# Patient Record
Sex: Female | Born: 1935 | Race: White | Hispanic: No | Marital: Married | Smoking: Never smoker
Health system: Southern US, Community
[De-identification: ages and names within clinical notes are randomized; demographics above are authoritative.]

## PROBLEM LIST (undated history)

## (undated) DIAGNOSIS — E785 Hyperlipidemia, unspecified: Secondary | ICD-10-CM

## (undated) DIAGNOSIS — I4892 Unspecified atrial flutter: Secondary | ICD-10-CM

## (undated) DIAGNOSIS — M3500 Sicca syndrome, unspecified: Secondary | ICD-10-CM

## (undated) DIAGNOSIS — M069 Rheumatoid arthritis, unspecified: Secondary | ICD-10-CM

## (undated) DIAGNOSIS — C801 Malignant (primary) neoplasm, unspecified: Secondary | ICD-10-CM

## (undated) DIAGNOSIS — I2699 Other pulmonary embolism without acute cor pulmonale: Secondary | ICD-10-CM

## (undated) DIAGNOSIS — I10 Essential (primary) hypertension: Secondary | ICD-10-CM

## (undated) DIAGNOSIS — M359 Systemic involvement of connective tissue, unspecified: Secondary | ICD-10-CM

## (undated) DIAGNOSIS — E039 Hypothyroidism, unspecified: Secondary | ICD-10-CM

## (undated) DIAGNOSIS — I82409 Acute embolism and thrombosis of unspecified deep veins of unspecified lower extremity: Secondary | ICD-10-CM

## (undated) HISTORY — PX: OTHER SURGICAL HISTORY: SHX169

## (undated) HISTORY — PX: ABDOMINAL HYSTERECTOMY: SHX81

## (undated) HISTORY — PX: APPENDECTOMY: SHX54

## (undated) HISTORY — PX: JOINT REPLACEMENT: SHX530

## (undated) HISTORY — PX: PATENT DUCTUS ARTERIOUS REPAIR: SHX269

## (undated) HISTORY — PX: ASD REPAIR: SHX258

---

## 2004-10-15 ENCOUNTER — Ambulatory Visit: Payer: Self-pay | Admitting: Internal Medicine

## 2004-11-13 ENCOUNTER — Ambulatory Visit: Payer: Self-pay | Admitting: Gastroenterology

## 2005-11-22 ENCOUNTER — Ambulatory Visit: Payer: Self-pay | Admitting: Internal Medicine

## 2006-12-11 ENCOUNTER — Ambulatory Visit: Payer: Self-pay | Admitting: Internal Medicine

## 2007-03-18 ENCOUNTER — Ambulatory Visit: Payer: Self-pay | Admitting: Internal Medicine

## 2008-01-07 ENCOUNTER — Ambulatory Visit: Payer: Self-pay | Admitting: Internal Medicine

## 2008-07-26 ENCOUNTER — Encounter: Payer: Self-pay | Admitting: Orthopedic Surgery

## 2008-08-25 ENCOUNTER — Encounter: Payer: Self-pay | Admitting: Orthopedic Surgery

## 2008-09-15 ENCOUNTER — Ambulatory Visit: Payer: Self-pay | Admitting: Ophthalmology

## 2008-10-04 ENCOUNTER — Ambulatory Visit: Payer: Self-pay | Admitting: Ophthalmology

## 2008-11-29 ENCOUNTER — Ambulatory Visit: Payer: Self-pay | Admitting: Ophthalmology

## 2008-12-06 ENCOUNTER — Ambulatory Visit: Payer: Self-pay | Admitting: Ophthalmology

## 2009-01-09 ENCOUNTER — Ambulatory Visit: Payer: Self-pay | Admitting: Internal Medicine

## 2009-08-03 ENCOUNTER — Ambulatory Visit: Payer: Self-pay | Admitting: Orthopedic Surgery

## 2010-01-10 ENCOUNTER — Ambulatory Visit: Payer: Self-pay | Admitting: Internal Medicine

## 2010-12-20 ENCOUNTER — Ambulatory Visit: Payer: Self-pay | Admitting: Internal Medicine

## 2010-12-24 ENCOUNTER — Ambulatory Visit: Payer: Self-pay | Admitting: Internal Medicine

## 2011-01-11 ENCOUNTER — Ambulatory Visit: Payer: Self-pay | Admitting: Internal Medicine

## 2011-02-26 ENCOUNTER — Ambulatory Visit: Payer: Self-pay | Admitting: Unknown Physician Specialty

## 2011-08-15 ENCOUNTER — Other Ambulatory Visit: Payer: Self-pay | Admitting: Internal Medicine

## 2011-08-30 ENCOUNTER — Ambulatory Visit: Payer: Self-pay | Admitting: Internal Medicine

## 2011-09-04 ENCOUNTER — Ambulatory Visit: Payer: Self-pay | Admitting: Internal Medicine

## 2011-10-28 DIAGNOSIS — G5601 Carpal tunnel syndrome, right upper limb: Secondary | ICD-10-CM | POA: Insufficient documentation

## 2011-10-28 DIAGNOSIS — M47812 Spondylosis without myelopathy or radiculopathy, cervical region: Secondary | ICD-10-CM | POA: Insufficient documentation

## 2011-10-29 DIAGNOSIS — Z79899 Other long term (current) drug therapy: Secondary | ICD-10-CM | POA: Insufficient documentation

## 2012-03-06 DIAGNOSIS — K279 Peptic ulcer, site unspecified, unspecified as acute or chronic, without hemorrhage or perforation: Secondary | ICD-10-CM | POA: Insufficient documentation

## 2012-03-06 DIAGNOSIS — Z8774 Personal history of (corrected) congenital malformations of heart and circulatory system: Secondary | ICD-10-CM | POA: Insufficient documentation

## 2012-03-06 DIAGNOSIS — E034 Atrophy of thyroid (acquired): Secondary | ICD-10-CM | POA: Insufficient documentation

## 2013-06-20 ENCOUNTER — Emergency Department: Payer: Self-pay | Admitting: Emergency Medicine

## 2013-06-20 LAB — CBC
MCH: 30.3 pg (ref 26.0–34.0)
MCHC: 34.1 g/dL (ref 32.0–36.0)
Platelet: 154 10*3/uL (ref 150–440)
RBC: 3.72 10*6/uL — ABNORMAL LOW (ref 3.80–5.20)
WBC: 2.4 10*3/uL — ABNORMAL LOW (ref 3.6–11.0)

## 2013-06-20 LAB — URINALYSIS, COMPLETE
Glucose,UR: NEGATIVE mg/dL (ref 0–75)
Ketone: NEGATIVE
Nitrite: POSITIVE
Protein: NEGATIVE
RBC,UR: 1 /HPF (ref 0–5)
Specific Gravity: 1.016 (ref 1.003–1.030)
Squamous Epithelial: NONE SEEN
WBC UR: 1 /HPF (ref 0–5)

## 2013-06-20 LAB — COMPREHENSIVE METABOLIC PANEL
Albumin: 3.4 g/dL (ref 3.4–5.0)
Alkaline Phosphatase: 78 U/L (ref 50–136)
BUN: 25 mg/dL — ABNORMAL HIGH (ref 7–18)
Bilirubin,Total: 0.6 mg/dL (ref 0.2–1.0)
Calcium, Total: 8.6 mg/dL (ref 8.5–10.1)
Chloride: 105 mmol/L (ref 98–107)
EGFR (African American): 52 — ABNORMAL LOW
Osmolality: 285 (ref 275–301)
SGPT (ALT): 20 U/L (ref 12–78)
Sodium: 140 mmol/L (ref 136–145)
Total Protein: 6.5 g/dL (ref 6.4–8.2)

## 2013-08-03 ENCOUNTER — Ambulatory Visit: Payer: Self-pay | Admitting: Internal Medicine

## 2014-07-07 ENCOUNTER — Ambulatory Visit: Payer: Self-pay | Admitting: Unknown Physician Specialty

## 2014-07-20 DIAGNOSIS — M1990 Unspecified osteoarthritis, unspecified site: Secondary | ICD-10-CM | POA: Insufficient documentation

## 2014-07-27 ENCOUNTER — Ambulatory Visit: Payer: Self-pay | Admitting: Internal Medicine

## 2014-07-27 LAB — IRON AND TIBC
Iron Bind.Cap.(Total): 350 ug/dL (ref 250–450)
Iron Saturation: 16 %
Iron: 56 ug/dL (ref 50–170)
UNBOUND IRON-BIND. CAP.: 294 ug/dL

## 2014-07-27 LAB — CBC CANCER CENTER
Bands: 5 %
HCT: 38 % (ref 35.0–47.0)
HGB: 12.5 g/dL (ref 12.0–16.0)
Lymphocytes: 19 %
MCH: 30.4 pg (ref 26.0–34.0)
MCHC: 33 g/dL (ref 32.0–36.0)
MCV: 92 fL (ref 80–100)
Monocytes: 17 %
Platelet: 194 x10 3/mm (ref 150–440)
RBC: 4.13 10*6/uL (ref 3.80–5.20)
RDW: 15 % — ABNORMAL HIGH (ref 11.5–14.5)
SEGMENTED NEUTROPHILS: 56 %
Variant Lymphocyte: 3 %
WBC: 3.6 x10 3/mm (ref 3.6–11.0)

## 2014-07-27 LAB — FERRITIN: Ferritin (ARMC): 123 ng/mL (ref 8–388)

## 2014-07-27 LAB — RETICULOCYTES
Absolute Retic Count: 0.1081 10*6/uL (ref 0.019–0.186)
Reticulocyte: 2.62 % (ref 0.4–3.1)

## 2014-07-27 LAB — LACTATE DEHYDROGENASE: LDH: 200 U/L (ref 81–246)

## 2014-07-27 LAB — FOLATE: Folic Acid: >100 ng/mL — ABNORMAL HIGH (ref 3.1–100.0)

## 2014-07-29 LAB — URINE IEP, RANDOM

## 2014-08-01 LAB — PROT IMMUNOELECTROPHORES(ARMC)

## 2014-08-12 ENCOUNTER — Ambulatory Visit: Payer: Self-pay | Admitting: Internal Medicine

## 2014-08-25 ENCOUNTER — Ambulatory Visit: Payer: Self-pay | Admitting: Internal Medicine

## 2015-07-19 ENCOUNTER — Other Ambulatory Visit: Payer: Self-pay | Admitting: Internal Medicine

## 2015-07-19 DIAGNOSIS — I728 Aneurysm of other specified arteries: Secondary | ICD-10-CM

## 2015-07-19 DIAGNOSIS — N183 Chronic kidney disease, stage 3 unspecified: Secondary | ICD-10-CM | POA: Insufficient documentation

## 2015-07-24 ENCOUNTER — Ambulatory Visit
Admission: RE | Admit: 2015-07-24 | Discharge: 2015-07-24 | Disposition: A | Discharge status: Home / Self Care | Payer: Medicare Other | Source: Ambulatory Visit | Attending: Internal Medicine | Admitting: Internal Medicine

## 2015-07-24 ENCOUNTER — Other Ambulatory Visit: Payer: Self-pay | Admitting: Internal Medicine

## 2015-07-24 DIAGNOSIS — I728 Aneurysm of other specified arteries: Secondary | ICD-10-CM

## 2015-07-24 DIAGNOSIS — R918 Other nonspecific abnormal finding of lung field: Secondary | ICD-10-CM | POA: Insufficient documentation

## 2015-07-24 HISTORY — DX: Essential (primary) hypertension: I10

## 2015-07-24 HISTORY — DX: Systemic involvement of connective tissue, unspecified: M35.9

## 2015-07-24 MED ORDER — IOHEXOL 350 MG/ML SOLN
75.0000 mL | Freq: Once | INTRAVENOUS | Status: AC | PRN
  Administered 2015-07-24: 75 mL via INTRAVENOUS

## 2015-07-27 ENCOUNTER — Other Ambulatory Visit: Payer: Self-pay | Admitting: Internal Medicine

## 2015-07-27 DIAGNOSIS — R911 Solitary pulmonary nodule: Secondary | ICD-10-CM | POA: Insufficient documentation

## 2015-10-16 ENCOUNTER — Ambulatory Visit
Admission: RE | Admit: 2015-10-16 | Discharge: 2015-10-16 | Disposition: A | Discharge status: Home / Self Care | Payer: Medicare Other | Source: Ambulatory Visit | Attending: Internal Medicine | Admitting: Internal Medicine

## 2015-10-16 DIAGNOSIS — R911 Solitary pulmonary nodule: Secondary | ICD-10-CM | POA: Diagnosis present

## 2015-10-16 DIAGNOSIS — I2510 Atherosclerotic heart disease of native coronary artery without angina pectoris: Secondary | ICD-10-CM | POA: Insufficient documentation

## 2015-10-16 DIAGNOSIS — M5134 Other intervertebral disc degeneration, thoracic region: Secondary | ICD-10-CM | POA: Insufficient documentation

## 2015-10-16 DIAGNOSIS — I700 Atherosclerosis of aorta: Secondary | ICD-10-CM | POA: Insufficient documentation

## 2015-10-23 ENCOUNTER — Other Ambulatory Visit: Payer: Self-pay | Admitting: Internal Medicine

## 2015-10-23 DIAGNOSIS — R911 Solitary pulmonary nodule: Secondary | ICD-10-CM

## 2015-10-30 ENCOUNTER — Encounter
Admission: RE | Admit: 2015-10-30 | Discharge: 2015-10-30 | Disposition: A | Discharge status: Home / Self Care | Payer: Medicare Other | Source: Ambulatory Visit | Attending: Internal Medicine | Admitting: Internal Medicine

## 2015-10-30 DIAGNOSIS — R911 Solitary pulmonary nodule: Secondary | ICD-10-CM | POA: Diagnosis not present

## 2015-10-30 LAB — GLUCOSE, CAPILLARY: GLUCOSE-CAPILLARY: 78 mg/dL (ref 65–99)

## 2015-10-30 MED ORDER — FLUDEOXYGLUCOSE F - 18 (FDG) INJECTION
12.3600 | Freq: Once | INTRAVENOUS | Status: AC | PRN
  Administered 2015-10-30: 12.36 via INTRAVENOUS

## 2015-11-01 DIAGNOSIS — R948 Abnormal results of function studies of other organs and systems: Secondary | ICD-10-CM | POA: Insufficient documentation

## 2015-11-02 ENCOUNTER — Other Ambulatory Visit: Payer: Self-pay | Admitting: Internal Medicine

## 2015-11-02 DIAGNOSIS — R911 Solitary pulmonary nodule: Secondary | ICD-10-CM

## 2015-11-14 ENCOUNTER — Other Ambulatory Visit: Payer: Self-pay | Admitting: Physician Assistant

## 2015-11-14 DIAGNOSIS — R948 Abnormal results of function studies of other organs and systems: Secondary | ICD-10-CM

## 2015-11-24 ENCOUNTER — Other Ambulatory Visit: Payer: Self-pay | Admitting: Physician Assistant

## 2015-11-24 ENCOUNTER — Ambulatory Visit
Admission: RE | Admit: 2015-11-24 | Discharge: 2015-11-24 | Disposition: A | Discharge status: Home / Self Care | Payer: Medicare Other | Source: Ambulatory Visit | Attending: Physician Assistant | Admitting: Physician Assistant

## 2015-11-24 DIAGNOSIS — K219 Gastro-esophageal reflux disease without esophagitis: Secondary | ICD-10-CM | POA: Insufficient documentation

## 2015-11-24 DIAGNOSIS — K5710 Diverticulosis of small intestine without perforation or abscess without bleeding: Secondary | ICD-10-CM | POA: Insufficient documentation

## 2015-11-24 DIAGNOSIS — K224 Dyskinesia of esophagus: Secondary | ICD-10-CM | POA: Insufficient documentation

## 2015-11-24 DIAGNOSIS — R948 Abnormal results of function studies of other organs and systems: Secondary | ICD-10-CM

## 2016-01-23 DIAGNOSIS — D709 Neutropenia, unspecified: Secondary | ICD-10-CM | POA: Insufficient documentation

## 2016-01-31 ENCOUNTER — Ambulatory Visit
Admission: RE | Admit: 2016-01-31 | Discharge: 2016-01-31 | Disposition: A | Discharge status: Home / Self Care | Payer: Medicare Other | Source: Ambulatory Visit | Attending: Internal Medicine | Admitting: Internal Medicine

## 2016-01-31 DIAGNOSIS — R911 Solitary pulmonary nodule: Secondary | ICD-10-CM | POA: Insufficient documentation

## 2016-07-23 DIAGNOSIS — E538 Deficiency of other specified B group vitamins: Secondary | ICD-10-CM | POA: Insufficient documentation

## 2016-07-23 DIAGNOSIS — E876 Hypokalemia: Secondary | ICD-10-CM | POA: Insufficient documentation

## 2016-07-25 ENCOUNTER — Other Ambulatory Visit: Payer: Self-pay | Admitting: Internal Medicine

## 2016-07-25 DIAGNOSIS — R9389 Abnormal findings on diagnostic imaging of other specified body structures: Secondary | ICD-10-CM

## 2016-08-07 ENCOUNTER — Ambulatory Visit
Admission: RE | Admit: 2016-08-07 | Discharge: 2016-08-07 | Disposition: A | Discharge status: Home / Self Care | Payer: Medicare Other | Source: Ambulatory Visit | Attending: Internal Medicine | Admitting: Internal Medicine

## 2016-08-07 DIAGNOSIS — R918 Other nonspecific abnormal finding of lung field: Secondary | ICD-10-CM | POA: Insufficient documentation

## 2016-08-07 DIAGNOSIS — I2510 Atherosclerotic heart disease of native coronary artery without angina pectoris: Secondary | ICD-10-CM | POA: Insufficient documentation

## 2016-08-07 DIAGNOSIS — I700 Atherosclerosis of aorta: Secondary | ICD-10-CM | POA: Insufficient documentation

## 2016-08-07 DIAGNOSIS — R9389 Abnormal findings on diagnostic imaging of other specified body structures: Secondary | ICD-10-CM

## 2016-08-07 DIAGNOSIS — R938 Abnormal findings on diagnostic imaging of other specified body structures: Secondary | ICD-10-CM | POA: Diagnosis present

## 2016-08-08 DIAGNOSIS — I700 Atherosclerosis of aorta: Secondary | ICD-10-CM | POA: Insufficient documentation

## 2016-08-19 DIAGNOSIS — D692 Other nonthrombocytopenic purpura: Secondary | ICD-10-CM | POA: Insufficient documentation

## 2016-12-24 ENCOUNTER — Other Ambulatory Visit: Payer: Self-pay | Admitting: Physician Assistant

## 2016-12-24 ENCOUNTER — Ambulatory Visit
Admission: RE | Admit: 2016-12-24 | Discharge: 2016-12-24 | Disposition: A | Discharge status: Home / Self Care | Payer: Medicare Other | Source: Ambulatory Visit | Attending: Physician Assistant | Admitting: Physician Assistant

## 2016-12-24 ENCOUNTER — Encounter: Payer: Self-pay | Admitting: Internal Medicine

## 2016-12-24 ENCOUNTER — Inpatient Hospital Stay
Admission: AD | Admit: 2016-12-24 | Discharge: 2016-12-26 | DRG: 176 | Disposition: A | Discharge status: Home / Self Care | Payer: Medicare Other | Source: Ambulatory Visit | Attending: Specialist | Admitting: Specialist

## 2016-12-24 DIAGNOSIS — Z820 Family history of epilepsy and other diseases of the nervous system: Secondary | ICD-10-CM

## 2016-12-24 DIAGNOSIS — R0609 Other forms of dyspnea: Principal | ICD-10-CM

## 2016-12-24 DIAGNOSIS — M069 Rheumatoid arthritis, unspecified: Secondary | ICD-10-CM | POA: Diagnosis present

## 2016-12-24 DIAGNOSIS — Z79899 Other long term (current) drug therapy: Secondary | ICD-10-CM

## 2016-12-24 DIAGNOSIS — M0520 Rheumatoid vasculitis with rheumatoid arthritis of unspecified site: Secondary | ICD-10-CM | POA: Diagnosis present

## 2016-12-24 DIAGNOSIS — R0602 Shortness of breath: Secondary | ICD-10-CM | POA: Diagnosis present

## 2016-12-24 DIAGNOSIS — I2699 Other pulmonary embolism without acute cor pulmonale: Secondary | ICD-10-CM | POA: Diagnosis present

## 2016-12-24 DIAGNOSIS — E039 Hypothyroidism, unspecified: Secondary | ICD-10-CM | POA: Diagnosis present

## 2016-12-24 DIAGNOSIS — E785 Hyperlipidemia, unspecified: Secondary | ICD-10-CM | POA: Diagnosis present

## 2016-12-24 DIAGNOSIS — Z7952 Long term (current) use of systemic steroids: Secondary | ICD-10-CM

## 2016-12-24 DIAGNOSIS — I10 Essential (primary) hypertension: Secondary | ICD-10-CM | POA: Diagnosis present

## 2016-12-24 DIAGNOSIS — Z90710 Acquired absence of both cervix and uterus: Secondary | ICD-10-CM | POA: Diagnosis not present

## 2016-12-24 DIAGNOSIS — M3500 Sicca syndrome, unspecified: Secondary | ICD-10-CM | POA: Diagnosis present

## 2016-12-24 DIAGNOSIS — Z86711 Personal history of pulmonary embolism: Secondary | ICD-10-CM | POA: Insufficient documentation

## 2016-12-24 HISTORY — DX: Rheumatoid arthritis, unspecified: M06.9

## 2016-12-24 HISTORY — DX: Hypothyroidism, unspecified: E03.9

## 2016-12-24 HISTORY — DX: Hyperlipidemia, unspecified: E78.5

## 2016-12-24 HISTORY — DX: Sjogren syndrome, unspecified: M35.00

## 2016-12-24 LAB — BASIC METABOLIC PANEL
ANION GAP: 8 (ref 5–15)
BUN: 23 mg/dL — ABNORMAL HIGH (ref 6–20)
CHLORIDE: 105 mmol/L (ref 101–111)
CO2: 26 mmol/L (ref 22–32)
Calcium: 8.6 mg/dL — ABNORMAL LOW (ref 8.9–10.3)
Creatinine, Ser: 1.07 mg/dL — ABNORMAL HIGH (ref 0.44–1.00)
GFR calc Af Amer: 51 mL/min — ABNORMAL LOW (ref >60)
GFR, EST NON AFRICAN AMERICAN: 44 mL/min — AB (ref >60)
GLUCOSE: 98 mg/dL (ref 65–99)
Potassium: 3.5 mmol/L (ref 3.5–5.1)
Sodium: 139 mmol/L (ref 135–145)

## 2016-12-24 LAB — POCT I-STAT CREATININE: Creatinine, Ser: 1.2 mg/dL — ABNORMAL HIGH (ref 0.44–1.00)

## 2016-12-24 LAB — PROTIME-INR
INR: 1.12
Prothrombin Time: 14.5 seconds (ref 11.4–15.2)

## 2016-12-24 LAB — CBC
HCT: 35.2 % (ref 35.0–47.0)
HEMOGLOBIN: 12.4 g/dL (ref 12.0–16.0)
MCH: 33.1 pg (ref 26.0–34.0)
MCHC: 35.2 g/dL (ref 32.0–36.0)
MCV: 94 fL (ref 80.0–100.0)
Platelets: 138 10*3/uL — ABNORMAL LOW (ref 150–440)
RBC: 3.74 MIL/uL — AB (ref 3.80–5.20)
RDW: 15.5 % — ABNORMAL HIGH (ref 11.5–14.5)
WBC: 4.6 10*3/uL (ref 3.6–11.0)

## 2016-12-24 LAB — TROPONIN I: TROPONIN I: 0.04 ng/mL — AB (ref <0.03)

## 2016-12-24 LAB — APTT: aPTT: 33 seconds (ref 24–36)

## 2016-12-24 MED ORDER — DIPHENHYDRAMINE HCL 25 MG PO CAPS
25.0000 mg | ORAL_CAPSULE | Freq: Every evening | ORAL | Status: DC | PRN
  Administered 2016-12-24: 25 mg via ORAL
  Filled 2016-12-24: qty 1

## 2016-12-24 MED ORDER — ACETAMINOPHEN 325 MG PO TABS: 650.0000 mg | ORAL_TABLET | Freq: Four times a day (QID) | ORAL | Status: DC | PRN

## 2016-12-24 MED ORDER — LEVOTHYROXINE SODIUM 50 MCG PO TABS
50.0000 ug | ORAL_TABLET | Freq: Every day | ORAL | Status: DC
  Administered 2016-12-25 – 2016-12-26 (×2): 50 ug via ORAL
  Filled 2016-12-24 (×2): qty 1

## 2016-12-24 MED ORDER — FUROSEMIDE 40 MG PO TABS
40.0000 mg | ORAL_TABLET | ORAL | Status: DC
  Administered 2016-12-25: 40 mg via ORAL
  Filled 2016-12-24: qty 1

## 2016-12-24 MED ORDER — HYDROXYCHLOROQUINE SULFATE 200 MG PO TABS
200.0000 mg | ORAL_TABLET | Freq: Every day | ORAL | Status: DC
  Administered 2016-12-25 – 2016-12-26 (×2): 200 mg via ORAL
  Filled 2016-12-24 (×2): qty 1

## 2016-12-24 MED ORDER — AMITRIPTYLINE HCL 10 MG PO TABS
20.0000 mg | ORAL_TABLET | Freq: Every day | ORAL | Status: DC
  Administered 2016-12-24 – 2016-12-25 (×2): 20 mg via ORAL
  Filled 2016-12-24 (×4): qty 2

## 2016-12-24 MED ORDER — ONDANSETRON HCL 4 MG/2ML IJ SOLN: 4.0000 mg | Freq: Four times a day (QID) | INTRAMUSCULAR | Status: DC | PRN

## 2016-12-24 MED ORDER — ONDANSETRON HCL 4 MG PO TABS: 4.0000 mg | ORAL_TABLET | Freq: Four times a day (QID) | ORAL | Status: DC | PRN

## 2016-12-24 MED ORDER — HEPARIN BOLUS VIA INFUSION
3500.0000 [IU] | Freq: Once | INTRAVENOUS | Status: AC
  Administered 2016-12-24: 3500 [IU] via INTRAVENOUS
  Filled 2016-12-24: qty 3500

## 2016-12-24 MED ORDER — FUROSEMIDE 80 MG PO TABS
80.0000 mg | ORAL_TABLET | ORAL | Status: DC
  Administered 2016-12-26: 80 mg via ORAL
  Filled 2016-12-24: qty 1

## 2016-12-24 MED ORDER — IOPAMIDOL (ISOVUE-370) INJECTION 76%
60.0000 mL | Freq: Once | INTRAVENOUS | Status: AC | PRN
Start: 2016-12-24 — End: 2016-12-24
  Administered 2016-12-24: 60 mL via INTRAVENOUS

## 2016-12-24 MED ORDER — HEPARIN (PORCINE) IN NACL 100-0.45 UNIT/ML-% IJ SOLN
1000.0000 [IU]/h | INTRAMUSCULAR | Status: DC
  Administered 2016-12-24: 1150 [IU]/h via INTRAVENOUS
  Filled 2016-12-24 (×2): qty 250

## 2016-12-24 MED ORDER — PREDNISOLONE 5 MG PO TABS
7.5000 mg | ORAL_TABLET | Freq: Every day | ORAL | Status: DC
  Filled 2016-12-24: qty 2

## 2016-12-24 MED ORDER — ACETAMINOPHEN 650 MG RE SUPP: 650.0000 mg | Freq: Four times a day (QID) | RECTAL | Status: DC | PRN

## 2016-12-24 MED ORDER — OXYCODONE HCL 5 MG PO TABS: 5.0000 mg | ORAL_TABLET | ORAL | Status: DC | PRN

## 2016-12-24 MED ORDER — SODIUM CHLORIDE 0.9% FLUSH
3.0000 mL | Freq: Two times a day (BID) | INTRAVENOUS | Status: DC
  Administered 2016-12-25 – 2016-12-26 (×2): 3 mL via INTRAVENOUS

## 2016-12-24 MED ORDER — LOSARTAN POTASSIUM 25 MG PO TABS
25.0000 mg | ORAL_TABLET | Freq: Every day | ORAL | Status: DC
  Administered 2016-12-24 – 2016-12-25 (×2): 25 mg via ORAL
  Filled 2016-12-24 (×3): qty 1

## 2016-12-24 NOTE — H&P (Signed)
[facility] [place] NAME: Carol Bryan    MR#:  [id number]  DATE OF BIRTH:  1923-04-13  DATE OF ADMISSION:  12/24/2016  PRIMARY CARE PHYSICIAN: Adin Hector, MD   REQUESTING/REFERRING PHYSICIAN: [facility]  CHIEF COMPLAINT:  Shortness of breath  HISTORY OF PRESENT ILLNESS:  Carol Bryan  is a 83 y.o. female who presents with Days of significantly progressive shortness of breath and she went to her primary physician for evaluation who ordered a CT scan which showed pulmonary embolus. She was then sent to the hospital for admission.  PAST MEDICAL HISTORY:   Past Medical History:  Diagnosis Date  . Collagen vascular disease (HCC)    RA  . HLD (hyperlipidemia)   . Hypertension   . Hypothyroid   . RA (rheumatoid arthritis) ([place])   . Sjogren's disease ([place])     PAST SURGICAL HISTORY:   Past Surgical History:  Procedure Laterality Date  . ABDOMINAL HYSTERECTOMY    . APPENDECTOMY    . JOINT REPLACEMENT Left    elbow  . PATENT DUCTUS ARTERIOUS REPAIR      SOCIAL HISTORY:   Social History  Substance Use Topics  . Smoking status: Never Smoker  . Smokeless tobacco: Not on file  . Alcohol use Yes     Comment: occassionally    FAMILY HISTORY:   Family History  Problem Relation Age of Onset  . Cancer Brother   . Alzheimer's disease Brother   . Stroke Mother   . Pancreatic cancer Father   . Stroke Sister     DRUG ALLERGIES:  Not on File  MEDICATIONS AT HOME:   Prior to Admission medications   Medication Sig Start Date End Date Taking? Authorizing Provider  amitriptyline (ELAVIL) 10 MG tablet Take 20 mg by mouth daily. 07/31/16  Yes Historical Provider, MD  azelastine (ASTELIN) 0.1 % nasal spray Place 1 spray into the nose 2 (two) times daily. 06/19/16  Yes Historical Provider, MD  diphenhydramine-acetaminophen (TYLENOL PM) 25-500 MG TABS tablet Take 1 tablet by mouth at bedtime as needed.   Yes Historical  Provider, MD  folic acid (FOLVITE) 1 MG tablet Take 1 mg by mouth 2 (two) times daily.  01/30/16  Yes Historical Provider, MD  furosemide (LASIX) 40 MG tablet Take 40 mg by mouth every other day. Alternates with 80 mg   Yes Historical Provider, MD  furosemide (LASIX) 80 MG tablet Take 80 mg by mouth every other day. Alternates with 40 mg 12/18/16  Yes Historical Provider, MD  hydroxychloroquine (PLAQUENIL) 200 MG tablet Take 200 mg by mouth daily.   Yes Historical Provider, MD  levothyroxine (SYNTHROID, LEVOTHROID) 50 MCG tablet Take 50 mcg by mouth daily before breakfast.   Yes Historical Provider, MD  losartan (COZAAR) 25 MG tablet Take 25 mg by mouth daily.   Yes Historical Provider, MD  methotrexate (RHEUMATREX) 2.5 MG tablet Take 2.5 mg by mouth once a week. Friday  Caution:Chemotherapy. Protect from light.   Yes Historical Provider, MD  potassium chloride SA (K-DUR,KLOR-CON) 20 MEQ tablet Take 20 mEq by mouth daily.   Yes Historical Provider, MD  prednisoLONE 5 MG TABS tablet Take 7.5 mg by mouth daily.   Yes Historical Provider, MD  Vitamin D, Ergocalciferol, (DRISDOL) 50000 units CAPS capsule Take 50,000 Units by mouth every 7 (seven) days. 10/03/16  Yes Historical Provider, MD    REVIEW OF SYSTEMS:  Review of Systems  Constitutional: Negative  for chills, fever, malaise/fatigue and weight loss.  HENT: Negative for ear pain, hearing loss and tinnitus.   Eyes: Negative for blurred vision, double vision, pain and redness.  Respiratory: Positive for shortness of breath. Negative for cough and hemoptysis.   Cardiovascular: Positive for chest pain. Negative for palpitations, orthopnea and leg swelling.  Gastrointestinal: Negative for abdominal pain, constipation, diarrhea, nausea and vomiting.  Genitourinary: Negative for dysuria, frequency and hematuria.  Musculoskeletal: Negative for back pain, joint pain and neck pain.  Skin:       No acne, rash, or lesions  Neurological: Positive for  weakness. Negative for dizziness, tremors and focal weakness.  Endo/Heme/Allergies: Negative for polydipsia. Does not bruise/bleed easily.  Psychiatric/Behavioral: Negative for depression. The patient is not nervous/anxious and does not have insomnia.      VITAL SIGNS:   Vitals:   12/24/16 1751 12/24/16 1839  BP: (!) 166/107   Pulse: (!) 102   Resp: 16   Temp: 97.5 F (36.4 C)   TempSrc: Oral   SpO2: 94%   Weight:  72.4 kg (159 lb 9.6 oz)  Height:  5\' 4"  (1.626 m)   Wt Readings from Last 3 Encounters:  12/24/16 72.4 kg (159 lb 9.6 oz)  10/30/15 73.5 kg (162 lb)    PHYSICAL EXAMINATION:  Physical Exam  Vitals reviewed. Constitutional: She is oriented to person, place, and time. She appears well-developed and well-nourished. No distress.  HENT:  Head: Normocephalic and atraumatic.  Mouth/Throat: Oropharynx is clear and moist.  Eyes: Conjunctivae and EOM are normal. Pupils are equal, round, and reactive to light. No scleral icterus.  Neck: Normal range of motion. Neck supple. No JVD present. No thyromegaly present.  Cardiovascular: Normal rate, regular rhythm and intact distal pulses.  Exam reveals no gallop and no friction rub.   No murmur heard. Respiratory: Effort normal and breath sounds normal. No respiratory distress. She has no wheezes. She has no rales.  GI: Soft. Bowel sounds are normal. She exhibits no distension. There is no tenderness.  Musculoskeletal: Normal range of motion. She exhibits no edema.  No arthritis, no gout  Lymphadenopathy:    She has no cervical adenopathy.  Neurological: She is alert and oriented to person, place, and time. No cranial nerve deficit.  No dysarthria, no aphasia  Skin: Skin is warm and dry. No rash noted. No erythema.  Psychiatric: She has a normal mood and affect. Her behavior is normal. Judgment and thought content normal.    LABORATORY PANEL:   CBC  Recent Labs Lab 12/24/16 1832  WBC 4.6  HGB 12.4  HCT 35.2  PLT 138*    ------------------------------------------------------------------------------------------------------------------  Chemistries   Recent Labs Lab 12/24/16 1832  NA 139  K 3.5  CL 105  CO2 26  GLUCOSE 98  BUN 23*  CREATININE 1.07*  CALCIUM 8.6*   ------------------------------------------------------------------------------------------------------------------  Cardiac Enzymes No results for input(s): TROPONINI in the last 168 hours. ------------------------------------------------------------------------------------------------------------------  RADIOLOGY:  Ct Angio Chest Pe W Or Wo Contrast  Result Date: 12/24/2016 CLINICAL DATA:  Acute onset of shortness of breath and chronic leg swelling. Initial encounter. EXAM: CT ANGIOGRAPHY CHEST WITH CONTRAST TECHNIQUE: Multidetector CT imaging of the chest was performed using the standard protocol during bolus administration of intravenous contrast. Multiplanar CT image reconstructions and MIPs were obtained to evaluate the vascular anatomy. CONTRAST:  60 mL of Isovue 370 IV contrast COMPARISON:  CT of the chest performed 08/07/2016, and PET/CT performed 10/30/2015 FINDINGS: Cardiovascular: Note is made of pulmonary  embolus within pulmonary arteries to both upper and both lower lobes. The RV/LV ratio of 1.1 corresponds to right heart strain and at least submassive pulmonary embolus. The heart remains borderline normal in size. Diffuse coronary artery calcifications are seen. Mixing artifact is noted along the descending thoracic aorta. Scattered calcification is noted along the descending thoracic aorta. The great vessels are grossly unremarkable, aside from minimal luminal narrowing along the proximal left subclavian artery due to calcific atherosclerotic disease. Mediastinum/Nodes: Trace pericardial fluid remains within normal limits. No mediastinal lymphadenopathy is seen. The thyroid gland is unremarkable. No axillary lymphadenopathy is  seen. Lungs/Pleura: Mild peripheral nodular scarring and fibrotic change are seen. A small 4 mm nodule is noted at the right upper lobe (image 33 of 96). A vague clump of nodules measuring 1.4 cm is again noted at the left lung base. None of these demonstrated abnormal FDG uptake on prior PET/CT, and they appear relatively stable from prior studies. No pleural effusion or pneumothorax is identified. Upper Abdomen: The visualized portions of the liver and gallbladder are grossly unremarkable. The spleen is borderline normal in size. The visualized portions of the pancreas and adrenal glands are within normal limits. A left renal cyst is partially imaged. Scattered calcification is seen along the proximal superior mesenteric artery, with mild luminal narrowing. Musculoskeletal: No acute osseous abnormalities are identified. Mild degenerative change is noted at the lower cervical spine, and at the right glenohumeral joint. The visualized musculature is unremarkable in appearance. Review of the MIP images confirms the above findings. IMPRESSION: 1. Pulmonary embolus within pulmonary arteries to both upper and both lower lung lobes. Positive for acute PE with CT evidence of right heart strain (RV/LV Ratio = 1.1) consistent with at least submassive (intermediate risk) PE. The presence of right heart strain has been associated with an increased risk of morbidity and mortality. 2. Diffuse coronary artery calcifications seen. 3. Mild peripheral nodular scarring and fibrotic change. 4 mm nodule at the right upper lobe. Vague clump of nodules again noted at the left lung base, measuring 1.4 cm. These appear relatively stable from prior studies, did not demonstrate abnormal FDG uptake on prior PET/CT, and are likely benign. 4. Scattered calcification along the proximal superior mesenteric artery, with mild luminal narrowing. 5. Left renal cyst noted. Critical Value/emergent results were called by telephone at the time of  interpretation on 12/24/2016 at 5:55 pm to Dr. Hewitt Blade, who verbally acknowledged these results. Electronically Signed   By: Garald Balding M.D.   On: 12/24/2016 17:58    EKG:  No orders found for this or any previous visit.  IMPRESSION AND PLAN:  Principal Problem:   Pulmonary embolism (HCC) - IV heparin started, patient will need to be switched to some form of oral anticoagulation prior to discharge Active Problems:   HTN (hypertension) - stable, continue home meds   Rheumatoid arteritis - continue home medications   HLD (hyperlipidemia) - home dose anti-lipids   Hypothyroidism - home dose thyroid replacement  All the records are reviewed and case discussed with ED provider. Management plans discussed with the patient and/or family.  DVT PROPHYLAXIS: Systemic anticoagulation  GI PROPHYLAXIS: None  ADMISSION STATUS: Inpatient  CODE STATUS: Full    Code Status Orders        Start     Ordered   12/24/16 2055  Full code  Continuous     12/24/16 2056    Code Status History    Date Active Date Inactive Code Status Order  ID Comments User Context   This patient has a current code status but no historical code status.    Advance Directive Documentation   [place] Most Recent Value  Type of Advance Directive  Living will  Pre-existing out of facility DNR order (yellow form or pink MOST form)  No data  "MOST" Form in Place?  No data    Full Code  TOTAL TIME TAKING CARE OF THIS PATIENT: 45 minutes.    Colbi Staubs [place] 12/24/2016, 8:56 PM  [facility]  Office  [phone]  CC: Primary care physician; Adin Hector, MD

## 2016-12-24 NOTE — Progress Notes (Signed)
ANTICOAGULATION CONSULT NOTE - Initial Consult  Pharmacy Consult for heparin infusion Indication: pulmonary embolus  Not on File  Patient Measurements: Height: 5\' 4"  (162.6 cm) Weight: 159 lb 9.6 oz (72.4 kg) IBW/kg (Calculated) : 54.7 Heparin Dosing Weight: 69.5 kg  Vital Signs: Temp: 97.5 F (36.4 C) (01/30 1751) Temp Source: Oral (01/30 1751) BP: 166/107 (01/30 1751) Pulse Rate: 102 (01/30 1751)  Labs:  Recent Labs  12/24/16 1730 12/24/16 1832  HGB  --  12.4  HCT  --  35.2  PLT  --  138*  APTT  --  33  LABPROT  --  14.5  INR  --  1.12  CREATININE 1.20* 1.07*    Estimated Creatinine Clearance: 33.4 mL/min (by C-G formula based on SCr of 1.07 mg/dL (H)).  Medical History: Past Medical History:  Diagnosis Date  . Collagen vascular disease (HCC)    RA  . Hypertension    Medications:  Prescriptions Prior to Admission  Medication Sig Dispense Refill Last Dose  . amitriptyline (ELAVIL) 10 MG tablet Take 20 mg by mouth daily.   12/23/2016 at 2000  . azelastine (ASTELIN) 0.1 % nasal spray Place 1 spray into the nose 2 (two) times daily.   prn at prn  . diphenhydramine-acetaminophen (TYLENOL PM) 25-500 MG TABS tablet Take 1 tablet by mouth at bedtime as needed.   12/23/2016 at 2000  . folic acid (FOLVITE) 1 MG tablet Take 1 mg by mouth 2 (two) times daily.    12/24/2016 at 0800  . furosemide (LASIX) 40 MG tablet Take 40 mg by mouth every other day. Alternates with 80 mg   12/23/2016 at 0800  . furosemide (LASIX) 80 MG tablet Take 80 mg by mouth every other day. Alternates with 40 mg   12/24/2016 at 0800  . hydroxychloroquine (PLAQUENIL) 200 MG tablet Take 200 mg by mouth daily.   12/24/2016 at 0800  . levothyroxine (SYNTHROID, LEVOTHROID) 50 MCG tablet Take 50 mcg by mouth daily before breakfast.   12/24/2016 at 0800  . losartan (COZAAR) 25 MG tablet Take 25 mg by mouth daily.   12/23/2016 at 2000  . methotrexate (RHEUMATREX) 2.5 MG tablet Take 2.5 mg by mouth once a week.  Friday  Caution:Chemotherapy. Protect from light.   12/20/2016 at 0800  . potassium chloride SA (K-DUR,KLOR-CON) 20 MEQ tablet Take 20 mEq by mouth daily.   12/24/2016 at 0800  . prednisoLONE 5 MG TABS tablet Take 7.5 mg by mouth daily.   12/24/2016 at 0800  . Vitamin D, Ergocalciferol, (DRISDOL) 50000 units CAPS capsule Take 50,000 Units by mouth every 7 (seven) days.   12/24/2016 at 0800    Assessment: Pharmacy consulted to dose and monitor heparin drip in this 83 year old woman who is directly admitted to the hospital with a PE. Spoke with MD to confirm. Medication reconciliation completed and patient is not taking any anticoagulants prior to admission.   Baseline labs (CBC, SCr, APTT, protime/INR) have been ordered and collected.  Goal of Therapy:  Heparin level 0.3-0.7 units/ml Monitor platelets by anticoagulation protocol: Yes   Plan:  Give 3500 units bolus x 1 Start heparin infusion at 1150 units/hr Check anti-Xa level in 8 hours and daily while on heparin Continue to monitor H&H and platelets  Lenis Noon, PharmD Clinical Pharmacist 12/24/2016,7:03 PM

## 2016-12-25 ENCOUNTER — Inpatient Hospital Stay: Payer: Medicare Other

## 2016-12-25 LAB — TROPONIN I
TROPONIN I: 0.04 ng/mL — AB (ref <0.03)
Troponin I: <0.03 ng/mL (ref <0.03)

## 2016-12-25 LAB — CBC
HCT: 34.8 % — ABNORMAL LOW (ref 35.0–47.0)
Hemoglobin: 12.2 g/dL (ref 12.0–16.0)
MCH: 32.8 pg (ref 26.0–34.0)
MCHC: 35.1 g/dL (ref 32.0–36.0)
MCV: 93.5 fL (ref 80.0–100.0)
PLATELETS: 131 10*3/uL — AB (ref 150–440)
RBC: 3.72 MIL/uL — ABNORMAL LOW (ref 3.80–5.20)
RDW: 15.1 % — ABNORMAL HIGH (ref 11.5–14.5)
WBC: 4.7 10*3/uL (ref 3.6–11.0)

## 2016-12-25 LAB — HEPARIN LEVEL (UNFRACTIONATED): Heparin Unfractionated: 0.9 IU/mL — ABNORMAL HIGH (ref 0.30–0.70)

## 2016-12-25 LAB — BASIC METABOLIC PANEL
Anion gap: 9 (ref 5–15)
BUN: 20 mg/dL (ref 6–20)
CALCIUM: 8.5 mg/dL — AB (ref 8.9–10.3)
CO2: 27 mmol/L (ref 22–32)
CREATININE: 0.96 mg/dL (ref 0.44–1.00)
Chloride: 106 mmol/L (ref 101–111)
GFR calc Af Amer: 58 mL/min — ABNORMAL LOW (ref >60)
GFR calc non Af Amer: 50 mL/min — ABNORMAL LOW (ref >60)
GLUCOSE: 93 mg/dL (ref 65–99)
Potassium: 3.5 mmol/L (ref 3.5–5.1)
Sodium: 142 mmol/L (ref 135–145)

## 2016-12-25 LAB — ANTITHROMBIN III: AntiThromb III Func: 101 % (ref 75–120)

## 2016-12-25 MED ORDER — PREDNISOLONE SODIUM PHOSPHATE 15 MG/5ML PO SOLN
7.5000 mg | Freq: Every day | ORAL | Status: DC
  Administered 2016-12-25 – 2016-12-26 (×2): 7.5 mg via ORAL
  Filled 2016-12-25 (×2): qty 5

## 2016-12-25 MED ORDER — APIXABAN 5 MG PO TABS
10.0000 mg | ORAL_TABLET | Freq: Two times a day (BID) | ORAL | Status: DC
  Administered 2016-12-25 – 2016-12-26 (×3): 10 mg via ORAL
  Filled 2016-12-25 (×3): qty 2

## 2016-12-25 MED ORDER — APIXABAN 5 MG PO TABS: 5.0000 mg | ORAL_TABLET | Freq: Two times a day (BID) | ORAL | Status: DC

## 2016-12-25 NOTE — Progress Notes (Signed)
[facility] [place] NAME: Carol Bryan    MR#:  [id number]  DATE OF BIRTH:  11/08/1923  SUBJECTIVE:  CHIEF COMPLAINT:  No chief complaint on file.  Pt. Here due to shortness of breath and noted to have PE.  Had dopplers today and + DVT on left side.  No hemoptysis and still has some Shortness of breath on exertion.   REVIEW OF SYSTEMS:    Review of Systems  Constitutional: Negative for chills and fever.  HENT: Negative for congestion and tinnitus.   Eyes: Negative for blurred vision and double vision.  Respiratory: Positive for shortness of breath. Negative for cough and wheezing.   Cardiovascular: Negative for chest pain, orthopnea and PND.  Gastrointestinal: Negative for abdominal pain, diarrhea, nausea and vomiting.  Genitourinary: Negative for dysuria and hematuria.  Neurological: Negative for dizziness, sensory change and focal weakness.  All other systems reviewed and are negative.   Nutrition: Heart Healthy Tolerating Diet: Yes Tolerating PT: Ambulatory  DRUG ALLERGIES:   Allergies  Allergen Reactions  . Gold-Containing Drug Products Rash  . Sulfa Antibiotics Rash    VITALS:  Blood pressure (!) 141/87, pulse (!) 101, temperature 97.7 F (36.5 C), temperature source Oral, resp. rate (!) 22, height 5\' 4"  (1.626 m), weight 72.4 kg (159 lb 9.6 oz), SpO2 92 %.  PHYSICAL EXAMINATION:   Physical Exam  GENERAL:  83 y.o.-year-old patient lying in the bed in no acute distress.  EYES: Pupils equal, round, reactive to light and accommodation. No scleral icterus. Extraocular muscles intact.  HEENT: Head atraumatic, normocephalic. Oropharynx and nasopharynx clear.  NECK:  Supple, no jugular venous distention. No thyroid enlargement, no tenderness.  LUNGS: Normal breath sounds bilaterally, no wheezing, rales, rhonchi. No use of accessory muscles of respiration.  CARDIOVASCULAR: S1, S2 normal. No murmurs, rubs, or gallops.  ABDOMEN:  Soft, nontender, nondistended. Bowel sounds present. No organomegaly or mass.  EXTREMITIES: No cyanosis, clubbing or edema b/l.    NEUROLOGIC: Cranial nerves II through XII are intact. No focal Motor or sensory deficits b/l.   PSYCHIATRIC: The patient is alert and oriented x 3.  SKIN: No obvious rash, lesion, or ulcer.    LABORATORY PANEL:   CBC  Recent Labs Lab 12/25/16 0337  WBC 4.7  HGB 12.2  HCT 34.8*  PLT 131*   ------------------------------------------------------------------------------------------------------------------  Chemistries   Recent Labs Lab 12/25/16 0337  NA 142  K 3.5  CL 106  CO2 27  GLUCOSE 93  BUN 20  CREATININE 0.96  CALCIUM 8.5*   ------------------------------------------------------------------------------------------------------------------  Cardiac Enzymes  Recent Labs Lab 12/25/16 0859  TROPONINI <0.03   ------------------------------------------------------------------------------------------------------------------  RADIOLOGY:  Ct Angio Chest Pe W Or Wo Contrast  Result Date: 12/24/2016 CLINICAL DATA:  Acute onset of shortness of breath and chronic leg swelling. Initial encounter. EXAM: CT ANGIOGRAPHY CHEST WITH CONTRAST TECHNIQUE: Multidetector CT imaging of the chest was performed using the standard protocol during bolus administration of intravenous contrast. Multiplanar CT image reconstructions and MIPs were obtained to evaluate the vascular anatomy. CONTRAST:  60 mL of Isovue 370 IV contrast COMPARISON:  CT of the chest performed 08/07/2016, and PET/CT performed 10/30/2015 FINDINGS: Cardiovascular: Note is made of pulmonary embolus within pulmonary arteries to both upper and both lower lobes. The RV/LV ratio of 1.1 corresponds to right heart strain and at least submassive pulmonary embolus. The heart remains borderline normal in size. Diffuse coronary artery calcifications are seen. Mixing artifact is noted along the  descending  thoracic aorta. Scattered calcification is noted along the descending thoracic aorta. The great vessels are grossly unremarkable, aside from minimal luminal narrowing along the proximal left subclavian artery due to calcific atherosclerotic disease. Mediastinum/Nodes: Trace pericardial fluid remains within normal limits. No mediastinal lymphadenopathy is seen. The thyroid gland is unremarkable. No axillary lymphadenopathy is seen. Lungs/Pleura: Mild peripheral nodular scarring and fibrotic change are seen. A small 4 mm nodule is noted at the right upper lobe (image 33 of 96). A vague clump of nodules measuring 1.4 cm is again noted at the left lung base. None of these demonstrated abnormal FDG uptake on prior PET/CT, and they appear relatively stable from prior studies. No pleural effusion or pneumothorax is identified. Upper Abdomen: The visualized portions of the liver and gallbladder are grossly unremarkable. The spleen is borderline normal in size. The visualized portions of the pancreas and adrenal glands are within normal limits. A left renal cyst is partially imaged. Scattered calcification is seen along the proximal superior mesenteric artery, with mild luminal narrowing. Musculoskeletal: No acute osseous abnormalities are identified. Mild degenerative change is noted at the lower cervical spine, and at the right glenohumeral joint. The visualized musculature is unremarkable in appearance. Review of the MIP images confirms the above findings. IMPRESSION: 1. Pulmonary embolus within pulmonary arteries to both upper and both lower lung lobes. Positive for acute PE with CT evidence of right heart strain (RV/LV Ratio = 1.1) consistent with at least submassive (intermediate risk) PE. The presence of right heart strain has been associated with an increased risk of morbidity and mortality. 2. Diffuse coronary artery calcifications seen. 3. Mild peripheral nodular scarring and fibrotic change. 4 mm nodule at the  right upper lobe. Vague clump of nodules again noted at the left lung base, measuring 1.4 cm. These appear relatively stable from prior studies, did not demonstrate abnormal FDG uptake on prior PET/CT, and are likely benign. 4. Scattered calcification along the proximal superior mesenteric artery, with mild luminal narrowing. 5. Left renal cyst noted. Critical Value/emergent results were called by telephone at the time of interpretation on 12/24/2016 at 5:55 pm to Dr. Hewitt Blade, who verbally acknowledged these results. Electronically Signed   By: Garald Balding M.D.   On: 12/24/2016 17:58   US Venous Img Lower Bilateral  Result Date: 12/25/2016 CLINICAL DATA:  Bilateral pulmonary emboli on recent CT. EXAM: BILATERAL LOWER EXTREMITY VENOUS DOPPLER ULTRASOUND TECHNIQUE: Gray-scale sonography with compression, as well as color and duplex ultrasound, were performed to evaluate the deep venous system from the level of the common femoral vein through the popliteal and proximal calf veins. COMPARISON:  None FINDINGS: On the right, Normal compressibility of the common femoral, superficial femoral, and popliteal veins, as well as the proximal calf veins. No filling defects to suggest DVT on grayscale or color Doppler imaging. Doppler waveforms show normal direction of venous flow, normal respiratory phasicity and response to augmentation. On the left, there is occlusive thrombus in posterior tibial and peroneal veins extending through the popliteal vein. There is extension of partially occlusive thrombus into the femoral vein. Common femoral vein remains widely patent incompressible. Saphenofemoral junction patent. Profunda femoral vein patent. IMPRESSION: 1. POSITIVE for LEFT calf and femoral-popliteal acute DVT. 2. Negative for right lower extremity DVT. Electronically Signed   By: Lucrezia Europe M.D.   On: 12/25/2016 15:09     ASSESSMENT AND PLAN:   83 year old female with past medical history of rheumatoid arthritis,  Sjogren's disease, hypothyroidism, hypertension, hyperlipidemia who  presents to the hospital due to shortness of breath and noted to have an acute pulmonary embolism.  1. Acute pulmonary embolism/DVT-this is the cause of patient's shortness of breath. -Patient is positive for pulmonary embolism and also left-sided DVT. Was on IV heparin, I have switched her to oral Eliquis.  ] - hemodynamically stable. Will discuss w/ Vascular to see if she would benefit from having a temp. IVC filter.  - unprovoked event and will get Thrombophilia work up.   2. history of rheumatoid arthritis-continue Plaquenil, prednisone  3. Hypothyroidism-continue Synthroid.  4. Essential hypertension-continue losartan.  All the records are reviewed and case discussed with Care Management/Social Worker. Management plans discussed with the patient, family and they are in agreement.  CODE STATUS: Full  DVT Prophylaxis: Eliquis  TOTAL TIME TAKING CARE OF THIS PATIENT: 30 minutes.   POSSIBLE D/C IN 1-2 DAYS, DEPENDING ON CLINICAL CONDITION.   Henreitta Leber M.D on 12/25/2016 at 3:27 PM  Between 7am to 6pm - Pager - [phone]  After 6pm go to [email or web address] - Proofreader  [facility]  Office  [phone]  CC: Primary care physician; Adin Hector, MD

## 2016-12-25 NOTE — Progress Notes (Signed)
ANTICOAGULATION CONSULT NOTE - Initial Consult  Pharmacy Consult for apixaban dosing Indication: pulmonary embolus  Allergies  Allergen Reactions  . Gold-Containing Drug Products Rash  . Sulfa Antibiotics Rash    Patient Measurements: Height: 5\' 4"  (162.6 cm) Weight: 159 lb 9.6 oz (72.4 kg) IBW/kg (Calculated) : 54.7 Heparin Dosing Weight: 69.5 kg  Vital Signs: Temp: 97.6 F (36.4 C) (01/31 0730) Temp Source: Oral (01/31 0730) BP: 143/78 (01/31 0730) Pulse Rate: 90 (01/31 0730)  Labs:  Recent Labs  12/24/16 1730 12/24/16 1832 12/24/16 2103 12/25/16 0337 12/25/16 0859  HGB  --  12.4  --  12.2  --   HCT  --  35.2  --  34.8*  --   PLT  --  138*  --  131*  --   APTT  --  33  --   --   --   LABPROT  --  14.5  --   --   --   INR  --  1.12  --   --   --   HEPARINUNFRC  --   --   --  0.90*  --   CREATININE 1.20* 1.07*  --  0.96  --   TROPONINI  --   --  0.04* 0.04* <0.03    Estimated Creatinine Clearance: 37.2 mL/min (by C-G formula based on SCr of 0.96 mg/dL).  Medical History: Past Medical History:  Diagnosis Date  . Collagen vascular disease (HCC)    RA  . HLD (hyperlipidemia)   . Hypertension   . Hypothyroid   . RA (rheumatoid arthritis) ([place])   . Sjogren's disease ([place])    Medications:  Prescriptions Prior to Admission  Medication Sig Dispense Refill Last Dose  . amitriptyline (ELAVIL) 10 MG tablet Take 20 mg by mouth daily.   12/23/2016 at 2000  . azelastine (ASTELIN) 0.1 % nasal spray Place 1 spray into the nose 2 (two) times daily.   prn at prn  . diphenhydramine-acetaminophen (TYLENOL PM) 25-500 MG TABS tablet Take 1 tablet by mouth at bedtime as needed.   12/23/2016 at 2000  . folic acid (FOLVITE) 1 MG tablet Take 1 mg by mouth 2 (two) times daily.    12/24/2016 at 0800  . furosemide (LASIX) 40 MG tablet Take 40 mg by mouth every other day. Alternates with 80 mg   12/23/2016 at 0800  . furosemide (LASIX) 80 MG tablet Take 80 mg by mouth every other day.  Alternates with 40 mg   12/24/2016 at 0800  . hydroxychloroquine (PLAQUENIL) 200 MG tablet Take 200 mg by mouth daily.   12/24/2016 at 0800  . levothyroxine (SYNTHROID, LEVOTHROID) 50 MCG tablet Take 50 mcg by mouth daily before breakfast.   12/24/2016 at 0800  . losartan (COZAAR) 25 MG tablet Take 25 mg by mouth daily.   12/23/2016 at 2000  . methotrexate (RHEUMATREX) 2.5 MG tablet Take 2.5 mg by mouth once a week. Friday  Caution:Chemotherapy. Protect from light.   12/20/2016 at 0800  . potassium chloride SA (K-DUR,KLOR-CON) 20 MEQ tablet Take 20 mEq by mouth daily.   12/24/2016 at 0800  . prednisoLONE 5 MG TABS tablet Take 7.5 mg by mouth daily.   12/24/2016 at 0800  . Vitamin D, Ergocalciferol, (DRISDOL) 50000 units CAPS capsule Take 50,000 Units by mouth every 7 (seven) days.   12/24/2016 at 0800    Assessment: Pharmacy consulted to dose and monitor heparin drip in this 83 year old woman who is directly admitted to the  hospital with a PE. Medication reconciliation completed and patient is not taking any anticoagulants prior to admission.  MD now wishes patient be transitioned to apixaban  Goal of Therapy:  Resolution of PE Monitor platelets by anticoagulation protocol: Yes   Plan:  Begin apixaban 10 mg bid x 7 days followed by 5 mg bid. Spoke with RN to d/c heparin drip as administering apixaban dose. Will counsel patient on apixaban prior to discharge. Pt will need anticoagulation for at least 3 months.  Darrow Bussing, PharmD Pharmacy Resident 12/25/2016 10:44 AM

## 2016-12-25 NOTE — Progress Notes (Signed)
ANTICOAGULATION CONSULT NOTE - Initial Consult  Pharmacy Consult for heparin infusion Indication: pulmonary embolus  Not on File  Patient Measurements: Height: 5\' 4"  (162.6 cm) Weight: 159 lb 9.6 oz (72.4 kg) IBW/kg (Calculated) : 54.7 Heparin Dosing Weight: 69.5 kg  Vital Signs: Temp: 97.5 F (36.4 C) (01/31 0406) Temp Source: Oral (01/31 0406) BP: 151/74 (01/31 0406) Pulse Rate: 87 (01/31 0406)  Labs:  Recent Labs  12/24/16 1730 12/24/16 1832 12/24/16 2103 12/25/16 0337  HGB  --  12.4  --  12.2  HCT  --  35.2  --  34.8*  PLT  --  138*  --  131*  APTT  --  33  --   --   LABPROT  --  14.5  --   --   INR  --  1.12  --   --   HEPARINUNFRC  --   --   --  0.90*  CREATININE 1.20* 1.07*  --   --   TROPONINI  --   --  0.04*  --     Estimated Creatinine Clearance: 33.4 mL/min (by C-G formula based on SCr of 1.07 mg/dL (H)).  Medical History: Past Medical History:  Diagnosis Date  . Collagen vascular disease (HCC)    RA  . HLD (hyperlipidemia)   . Hypertension   . Hypothyroid   . RA (rheumatoid arthritis) ([place])   . Sjogren's disease ([place])    Medications:  Prescriptions Prior to Admission  Medication Sig Dispense Refill Last Dose  . amitriptyline (ELAVIL) 10 MG tablet Take 20 mg by mouth daily.   12/23/2016 at 2000  . azelastine (ASTELIN) 0.1 % nasal spray Place 1 spray into the nose 2 (two) times daily.   prn at prn  . diphenhydramine-acetaminophen (TYLENOL PM) 25-500 MG TABS tablet Take 1 tablet by mouth at bedtime as needed.   12/23/2016 at 2000  . folic acid (FOLVITE) 1 MG tablet Take 1 mg by mouth 2 (two) times daily.    12/24/2016 at 0800  . furosemide (LASIX) 40 MG tablet Take 40 mg by mouth every other day. Alternates with 80 mg   12/23/2016 at 0800  . furosemide (LASIX) 80 MG tablet Take 80 mg by mouth every other day. Alternates with 40 mg   12/24/2016 at 0800  . hydroxychloroquine (PLAQUENIL) 200 MG tablet Take 200 mg by mouth daily.   12/24/2016 at 0800  .  levothyroxine (SYNTHROID, LEVOTHROID) 50 MCG tablet Take 50 mcg by mouth daily before breakfast.   12/24/2016 at 0800  . losartan (COZAAR) 25 MG tablet Take 25 mg by mouth daily.   12/23/2016 at 2000  . methotrexate (RHEUMATREX) 2.5 MG tablet Take 2.5 mg by mouth once a week. Friday  Caution:Chemotherapy. Protect from light.   12/20/2016 at 0800  . potassium chloride SA (K-DUR,KLOR-CON) 20 MEQ tablet Take 20 mEq by mouth daily.   12/24/2016 at 0800  . prednisoLONE 5 MG TABS tablet Take 7.5 mg by mouth daily.   12/24/2016 at 0800  . Vitamin D, Ergocalciferol, (DRISDOL) 50000 units CAPS capsule Take 50,000 Units by mouth every 7 (seven) days.   12/24/2016 at 0800    Assessment: Pharmacy consulted to dose and monitor heparin drip in this 83 year old woman who is directly admitted to the hospital with a PE. Spoke with MD to confirm. Medication reconciliation completed and patient is not taking any anticoagulants prior to admission.   Baseline labs (CBC, SCr, APTT, protime/INR) have been ordered and collected.  Goal of Therapy:  Heparin level 0.3-0.7 units/ml Monitor platelets by anticoagulation protocol: Yes   Plan:  Give 3500 units bolus x 1 Start heparin infusion at 1150 units/hr Check anti-Xa level in 8 hours and daily while on heparin Continue to monitor H&H and platelets  1/31 0337 HL supratherapeutic. Decrease to 1000 units/hr and recheck HL in 8 hours.  Laural Benes, PharmD, BCPS Clinical Pharmacist 12/25/2016,5:24 AM

## 2016-12-26 LAB — PROTEIN C, TOTAL: PROTEIN C, TOTAL: 65 % (ref 60–150)

## 2016-12-26 MED ORDER — APIXABAN 5 MG PO TABS: 5.0000 mg | ORAL_TABLET | Freq: Two times a day (BID) | ORAL | 1 refills | Status: DC

## 2016-12-26 MED ORDER — APIXABAN 5 MG PO TABS: 10.0000 mg | ORAL_TABLET | Freq: Two times a day (BID) | ORAL | 0 refills | Status: DC

## 2016-12-26 NOTE — Care Management Important Message (Signed)
Important Message  Patient Details  Name: NYLAYAH HAWA MRN: [id number] Date of Birth: 1923-02-03   Medicare Important Message Given:  N/A - LOS <3 / Initial given by admissions    Beverly Sessions, RN 12/26/2016, 12:08 PM

## 2016-12-26 NOTE — Progress Notes (Signed)
Discharged patient home with her daughter, reviewed  Discharge instructions with patient and daughter including how to take medications.  Patient indicated understanding.  Also rievewed how to access my chart

## 2016-12-26 NOTE — Discharge Summary (Signed)
[facility] [place] [facility] NAME: Carol Bryan    MR#:  [id number]  DATE OF BIRTH:  1923/02/20  DATE OF ADMISSION:  12/24/2016 ADMITTING PHYSICIAN: Harvie Bridge, DO  DATE OF DISCHARGE: 12/26/2016  3:12 PM  PRIMARY CARE PHYSICIAN: [name], MD    ADMISSION DIAGNOSIS:  dyspnea exertion  DISCHARGE DIAGNOSIS:  Principal Problem:   Pulmonary embolism (HCC) Active Problems:   HTN (hypertension)   HLD (hyperlipidemia)   Hypothyroidism   Rheumatoid arteritis   SECONDARY DIAGNOSIS:   Past Medical History:  Diagnosis Date  . Collagen vascular disease (HCC)    RA  . HLD (hyperlipidemia)   . Hypertension   . Hypothyroid   . RA (rheumatoid arthritis) ([place])   . Sjogren's disease [facility])     HOSPITAL COURSE:   83 year old female with past medical history of rheumatoid arthritis, Sjogren's disease, hypothyroidism, hypertension, hyperlipidemia who presents to the hospital due to shortness of breath and noted to have an acute pulmonary embolism.  1. Acute pulmonary embolism/DVT-this was the cause of patient's shortness of breath. -Patient was positive for pulmonary embolism and also left-sided DVT.  -She will patient was placed on IV heparin drip. She has been switched over to oral Eliquis and now being discharged on that.   -She is hemodynamically stable and not hypoxic. This is a unprovoked the VTE and she has no risk factors.  Thrombophilia work up initiated and can be followed up as outpatient.   2. history of rheumatoid arthritis- she will continue Plaquenil, prednisone  3. Hypothyroidism- she will continue Synthroid.  4. Essential hypertension- she will continue losartan  DISCHARGE CONDITIONS:   Stable  CONSULTS OBTAINED:    DRUG ALLERGIES:   Allergies  Allergen Reactions  . Gold-Containing Drug Products Rash  . Sulfa Antibiotics Rash    DISCHARGE MEDICATIONS:   Allergies as of 12/26/2016      Reactions    Gold-containing Drug Products Rash   Sulfa Antibiotics Rash      Medication List    TAKE these medications   amitriptyline 10 MG tablet Commonly known as:  ELAVIL Take 20 mg by mouth daily.   apixaban 5 MG Tabs tablet Commonly known as:  ELIQUIS Take 2 tablets (10 mg total) by mouth 2 (two) times daily.   apixaban 5 MG Tabs tablet Commonly known as:  ELIQUIS Take 1 tablet (5 mg total) by mouth 2 (two) times daily. Start taking on:  01/01/2017   azelastine 0.1 % nasal spray Commonly known as:  ASTELIN Place 1 spray into the nose 2 (two) times daily.   diphenhydramine-acetaminophen 25-500 MG Tabs tablet Commonly known as:  TYLENOL PM Take 1 tablet by mouth at bedtime as needed.   folic acid 1 MG tablet Commonly known as:  FOLVITE Take 1 mg by mouth 2 (two) times daily.   furosemide 40 MG tablet Commonly known as:  LASIX Take 40 mg by mouth every other day. Alternates with 80 mg   furosemide 80 MG tablet Commonly known as:  LASIX Take 80 mg by mouth every other day. Alternates with 40 mg   hydroxychloroquine 200 MG tablet Commonly known as:  PLAQUENIL Take 200 mg by mouth daily.   levothyroxine 50 MCG tablet Commonly known as:  SYNTHROID, LEVOTHROID Take 50 mcg by mouth daily before breakfast.   losartan 25 MG tablet Commonly known as:  COZAAR Take 25 mg by mouth daily.   methotrexate 2.5 MG tablet Commonly known  as:  RHEUMATREX Take 2.5 mg by mouth once a week. Friday  Caution:Chemotherapy. Protect from light.   potassium chloride SA 20 MEQ tablet Commonly known as:  K-DUR,KLOR-CON Take 20 mEq by mouth daily.   prednisoLONE 5 MG Tabs tablet Take 7.5 mg by mouth daily.   Vitamin D (Ergocalciferol) 50000 units Caps capsule Commonly known as:  DRISDOL Take 50,000 Units by mouth every 7 (seven) days.         DISCHARGE INSTRUCTIONS:   DIET:  Cardiac diet  DISCHARGE CONDITION:  Stable  ACTIVITY:  Activity as tolerated  OXYGEN:  Home Oxygen:  No.   Oxygen Delivery: room air  DISCHARGE LOCATION:  home   If you experience worsening of your admission symptoms, develop shortness of breath, life threatening emergency, suicidal or homicidal thoughts you must seek medical attention immediately by calling 911 or calling your MD immediately  if symptoms less severe.  You Must read complete instructions/literature along with all the possible adverse reactions/side effects for all the Medicines you take and that have been prescribed to you. Take any new Medicines after you have completely understood and accpet all the possible adverse reactions/side effects.   Please note  You were cared for by a hospitalist during your hospital stay. If you have any questions about your discharge medications or the care you received while you were in the hospital after you are discharged, you can call the unit and asked to speak with the hospitalist on call if the hospitalist that took care of you is not available. Once you are discharged, your primary care physician will handle any further medical issues. Please note that NO REFILLS for any discharge medications will be authorized once you are discharged, as it is imperative that you return to your primary care physician (or establish a relationship with a primary care physician if you do not have one) for your aftercare needs so that they can reassess your need for medications and monitor your lab values.     Today   Shortness of breath improved.  No hemoptysis.  No acute events or complaints overnight.   VITAL SIGNS:  Blood pressure (!) 147/88, pulse 88, temperature 97.6 F (36.4 C), temperature source Oral, resp. rate 18, height 5\' 4"  (1.626 m), weight 72.4 kg (159 lb 9.6 oz), SpO2 92 %.  I/O:   Intake/Output Summary (Last 24 hours) at 12/26/16 1620 Last data filed at 12/26/16 1408  Gross per 24 hour  Intake              343 ml  Output              200 ml  Net              143 ml    PHYSICAL  EXAMINATION:  GENERAL:  83 y.o.-year-old patient lying in the bed with no acute distress.  EYES: Pupils equal, round, reactive to light and accommodation. No scleral icterus. Extraocular muscles intact.  HEENT: Head atraumatic, normocephalic. Oropharynx and nasopharynx clear.  NECK:  Supple, no jugular venous distention. No thyroid enlargement, no tenderness.  LUNGS: Normal breath sounds bilaterally, no wheezing, rales,rhonchi. No use of accessory muscles of respiration.  CARDIOVASCULAR: S1, S2 normal. No murmurs, rubs, or gallops.  ABDOMEN: Soft, non-tender, non-distended. Bowel sounds present. No organomegaly or mass.  EXTREMITIES: No pedal edema, cyanosis, or clubbing.  NEUROLOGIC: Cranial nerves II through XII are intact. No focal motor or sensory defecits b/l.  PSYCHIATRIC: The patient is alert and  oriented x 3.   SKIN: No obvious rash, lesion, or ulcer.   DATA REVIEW:   CBC  Recent Labs Lab 12/25/16 0337  WBC 4.7  HGB 12.2  HCT 34.8*  PLT 131*    Chemistries   Recent Labs Lab 12/25/16 0337  NA 142  K 3.5  CL 106  CO2 27  GLUCOSE 93  BUN 20  CREATININE 0.96  CALCIUM 8.5*    Cardiac Enzymes  Recent Labs Lab 12/25/16 0859  TROPONINI <0.03    Microbiology Results  No results found for this or any previous visit.  RADIOLOGY:  Ct Angio Chest Pe W Or Wo Contrast  Result Date: 12/24/2016 CLINICAL DATA:  Acute onset of shortness of breath and chronic leg swelling. Initial encounter. EXAM: CT ANGIOGRAPHY CHEST WITH CONTRAST TECHNIQUE: Multidetector CT imaging of the chest was performed using the standard protocol during bolus administration of intravenous contrast. Multiplanar CT image reconstructions and MIPs were obtained to evaluate the vascular anatomy. CONTRAST:  60 mL of Isovue 370 IV contrast COMPARISON:  CT of the chest performed 08/07/2016, and PET/CT performed 10/30/2015 FINDINGS: Cardiovascular: Note is made of pulmonary embolus within pulmonary arteries  to both upper and both lower lobes. The RV/LV ratio of 1.1 corresponds to right heart strain and at least submassive pulmonary embolus. The heart remains borderline normal in size. Diffuse coronary artery calcifications are seen. Mixing artifact is noted along the descending thoracic aorta. Scattered calcification is noted along the descending thoracic aorta. The great vessels are grossly unremarkable, aside from minimal luminal narrowing along the proximal left subclavian artery due to calcific atherosclerotic disease. Mediastinum/Nodes: Trace pericardial fluid remains within normal limits. No mediastinal lymphadenopathy is seen. The thyroid gland is unremarkable. No axillary lymphadenopathy is seen. Lungs/Pleura: Mild peripheral nodular scarring and fibrotic change are seen. A small 4 mm nodule is noted at the right upper lobe (image 33 of 96). A vague clump of nodules measuring 1.4 cm is again noted at the left lung base. None of these demonstrated abnormal FDG uptake on prior PET/CT, and they appear relatively stable from prior studies. No pleural effusion or pneumothorax is identified. Upper Abdomen: The visualized portions of the liver and gallbladder are grossly unremarkable. The spleen is borderline normal in size. The visualized portions of the pancreas and adrenal glands are within normal limits. A left renal cyst is partially imaged. Scattered calcification is seen along the proximal superior mesenteric artery, with mild luminal narrowing. Musculoskeletal: No acute osseous abnormalities are identified. Mild degenerative change is noted at the lower cervical spine, and at the right glenohumeral joint. The visualized musculature is unremarkable in appearance. Review of the MIP images confirms the above findings. IMPRESSION: 1. Pulmonary embolus within pulmonary arteries to both upper and both lower lung lobes. Positive for acute PE with CT evidence of right heart strain (RV/LV Ratio = 1.1) consistent with  at least submassive (intermediate risk) PE. The presence of right heart strain has been associated with an increased risk of morbidity and mortality. 2. Diffuse coronary artery calcifications seen. 3. Mild peripheral nodular scarring and fibrotic change. 4 mm nodule at the right upper lobe. Vague clump of nodules again noted at the left lung base, measuring 1.4 cm. These appear relatively stable from prior studies, did not demonstrate abnormal FDG uptake on prior PET/CT, and are likely benign. 4. Scattered calcification along the proximal superior mesenteric artery, with mild luminal narrowing. 5. Left renal cyst noted. Critical Value/emergent results were called by telephone at the  time of interpretation on 12/24/2016 at 5:55 pm to Dr. Hewitt Blade, who verbally acknowledged these results. Electronically Signed   By: Garald Balding M.D.   On: 12/24/2016 17:58   US Venous Img Lower Bilateral  Result Date: 12/25/2016 CLINICAL DATA:  Bilateral pulmonary emboli on recent CT. EXAM: BILATERAL LOWER EXTREMITY VENOUS DOPPLER ULTRASOUND TECHNIQUE: Gray-scale sonography with compression, as well as color and duplex ultrasound, were performed to evaluate the deep venous system from the level of the common femoral vein through the popliteal and proximal calf veins. COMPARISON:  None FINDINGS: On the right, Normal compressibility of the common femoral, superficial femoral, and popliteal veins, as well as the proximal calf veins. No filling defects to suggest DVT on grayscale or color Doppler imaging. Doppler waveforms show normal direction of venous flow, normal respiratory phasicity and response to augmentation. On the left, there is occlusive thrombus in posterior tibial and peroneal veins extending through the popliteal vein. There is extension of partially occlusive thrombus into the femoral vein. Common femoral vein remains widely patent incompressible. Saphenofemoral junction patent. Profunda femoral vein patent.  IMPRESSION: 1. POSITIVE for LEFT calf and femoral-popliteal acute DVT. 2. Negative for right lower extremity DVT. Electronically Signed   By: Lucrezia Europe M.D.   On: 12/25/2016 15:09      Management plans discussed with the patient, family and they are in agreement.  CODE STATUS:     Code Status Orders        Start     Ordered   12/24/16 2055  Full code  Continuous     12/24/16 2056    Code Status History    Date Active Date Inactive Code Status Order ID Comments User Context   This patient has a current code status but no historical code status.    Advance Directive Documentation   [place] Most Recent Value  Type of Advance Directive  Living will  Pre-existing out of facility DNR order (yellow form or pink MOST form)  No data  "MOST" Form in Place?  No data      TOTAL TIME TAKING CARE OF THIS PATIENT: 40 minutes.    Henreitta Leber M.D on 12/26/2016 at 4:20 PM  Between 7am to 6pm - Pager - [phone]  After 6pm go to [email or web address] - Proofreader  [facility]  Office  [phone]  CC: Primary care physician; Adin Hector, MD

## 2016-12-26 NOTE — Care Management (Signed)
Patient admitted with PE and DVT.  Patient to discharge today on Eliquis.  Patient lives at home with her husband.  Adult children live locally for support.  Patient independent of ADL's, and still drives.  PCP [facility].  [facility], and mail scripts. Daughter to transport at discharge.  30 eliquis coupon provided.  RNCM signing off.

## 2016-12-27 LAB — TT MIX+TTN: THROMBIN NEUTRALIZATION: 23 s (ref 0.0–23.0)

## 2016-12-27 LAB — PROTEIN S PANEL
PROTEIN S ACTIVITY: 78 % (ref 63–140)
PROTEIN S AG FREE: 99 % (ref 57–157)
Protein S Ag, Total: 90 % (ref 60–150)

## 2016-12-27 LAB — LUPUS ANTICOAGULANT
DRVVT: 43.9 s (ref 0.0–47.0)
PTT Lupus Anticoagulant: 45.6 s (ref 0.0–51.9)
Thrombin Time: >150 s — ABNORMAL HIGH (ref 0.0–23.0)
dPT Confirm Ratio: 0.8 Ratio (ref 0.00–1.40)
dPT: 51 s (ref 0.0–55.0)

## 2016-12-30 LAB — FACTOR 5 LEIDEN

## 2017-01-10 ENCOUNTER — Ambulatory Visit: Payer: Medicare Other | Admitting: Oncology

## 2017-01-14 DIAGNOSIS — I82432 Acute embolism and thrombosis of left popliteal vein: Secondary | ICD-10-CM | POA: Insufficient documentation

## 2017-01-14 NOTE — Progress Notes (Signed)
Carol Bryan  Telephone:(336) [phone] Fax:(336) [phone]  ID: Carol Bryan OB: [birth year]  MR#: [id number]  [id number]  Patient Care Team: Adin Hector, MD as PCP - General (Internal Medicine)  CHIEF COMPLAINT: Pulmonary embolism, left popliteal DVT.  INTERVAL HISTORY: Patient is a 83 year old female who presented to the emergency room with acute onset shortness of breath as well as chronic left leg swelling. Patient was found to have pulmonary embolus within the pulmonary arteries of the both upper and both lower lobe of the lungs. Patient had no obvious transient risk factors. She currently is on Eliquis and tolerating it well without significant side effects. She has no neurologic complaints. She denies any recent fevers. She has a good appetite and denies weight loss. She denies any further chest pain or shortness of breath. She has no cough or hemoptysis. She denies any nausea, vomiting, constipation, or diarrhea. She has no urinary complaints. Patient feels at her baseline and offers no specific complaints today.  REVIEW OF SYSTEMS:   Review of Systems  Constitutional: Negative.  Negative for fever, malaise/fatigue and weight loss.  Respiratory: Negative.  Negative for cough, hemoptysis and shortness of breath.   Cardiovascular: Negative.  Negative for chest pain and leg swelling.  Gastrointestinal: Negative.  Negative for abdominal pain.  Genitourinary: Negative.   Musculoskeletal: Negative.   Neurological: Negative.  Negative for sensory change and weakness.  Psychiatric/Behavioral: Negative.  The patient is not nervous/anxious.     As per HPI. Otherwise, a complete review of systems is negative.  PAST MEDICAL HISTORY: Past Medical History:  Diagnosis Date  . Collagen vascular disease (HCC)    RA  . HLD (hyperlipidemia)   . Hypertension   . Hypothyroid   . RA (rheumatoid arthritis) ([place])   . Sjogren's disease ([place])     PAST SURGICAL  HISTORY: Past Surgical History:  Procedure Laterality Date  . ABDOMINAL HYSTERECTOMY    . APPENDECTOMY    . ASD REPAIR  age 64  . JOINT REPLACEMENT Left    elbow  . PATENT DUCTUS ARTERIOUS REPAIR      FAMILY HISTORY: Family History  Problem Relation Age of Onset  . Cancer Brother   . Stroke Mother   . Kidney cancer Father   . Stroke Sister   . Cancer Sister   . Lung cancer Sister   . Liver cancer Sister     ADVANCED DIRECTIVES (Y/N):  N  HEALTH MAINTENANCE: Social History  Substance Use Topics  . Smoking status: Never Smoker  . Smokeless tobacco: Never Used  . Alcohol use Yes     Comment: occasionally      Colonoscopy:  PAP:  Bone density:  Lipid panel:  Allergies  Allergen Reactions  . Gold-Containing Drug Products Rash  . Sulfa Antibiotics Rash    Current Outpatient Prescriptions  Medication Sig Dispense Refill  . amitriptyline (ELAVIL) 10 MG tablet Take 20 mg by mouth daily.    Marland Kitchen apixaban (ELIQUIS) 5 MG TABS tablet Take 1 tablet (5 mg total) by mouth 2 (two) times daily. 60 tablet 1  . azelastine (ASTELIN) 0.1 % nasal spray Place into the nose.    . diphenhydramine-acetaminophen (TYLENOL PM) 25-500 MG TABS tablet Take 1 tablet by mouth at bedtime as needed.    . folic acid (FOLVITE) 1 MG tablet Take 1 mg by mouth 2 (two) times daily.     . furosemide (LASIX) 40 MG tablet Take 40 mg by mouth every other day.  Alternates with 80 mg    . hydroxychloroquine (PLAQUENIL) 200 MG tablet Take 200 mg by mouth daily.    Marland Kitchen levothyroxine (SYNTHROID, LEVOTHROID) 50 MCG tablet Take 50 mcg by mouth daily before breakfast.    . losartan (COZAAR) 25 MG tablet Take 25 mg by mouth daily.    . methotrexate (RHEUMATREX) 2.5 MG tablet Take 2.5 mg by mouth once a week. Friday  Caution:Chemotherapy. Protect from light.    . potassium chloride SA (K-DUR,KLOR-CON) 20 MEQ tablet Take 20 mEq by mouth daily.    . prednisoLONE 5 MG TABS tablet Take 7.5 mg by mouth daily.    . Vitamin  D, Ergocalciferol, (DRISDOL) 50000 units CAPS capsule Take 50,000 Units by mouth every 7 (seven) days.    Marland Kitchen apixaban (ELIQUIS) 5 MG TABS tablet Take 2 tablets (10 mg total) by mouth 2 (two) times daily. 12 tablet 0   No current facility-administered medications for this visit.     OBJECTIVE: Vitals:   01/15/17 1456  BP: (!) 166/91  Pulse: (!) 108  Temp: 97.5 F (36.4 C)     Body mass index is 27.34 kg/m.    ECOG FS:0 - Asymptomatic  General: Well-developed, well-nourished, no acute distress. Eyes: Pink conjunctiva, anicteric sclera. HEENT: Normocephalic, moist mucous membranes, clear oropharnyx. Lungs: Clear to auscultation bilaterally. Heart: Regular rate and rhythm. No rubs, murmurs, or gallops. Abdomen: Soft, nontender, nondistended. No organomegaly noted, normoactive bowel sounds. Musculoskeletal: No edema, cyanosis, or clubbing. Neuro: Alert, answering all questions appropriately. Cranial nerves grossly intact. Skin: No rashes or petechiae noted. Psych: Normal affect. Lymphatics: No cervical, calvicular, axillary or inguinal LAD.   LAB RESULTS:  Lab Results  Component Value Date   NA 142 12/25/2016   K 3.5 12/25/2016   CL 106 12/25/2016   CO2 27 12/25/2016   GLUCOSE 93 12/25/2016   BUN 20 12/25/2016   CREATININE 0.96 12/25/2016   CALCIUM 8.5 (L) 12/25/2016   PROT 6.5 06/20/2013   ALBUMIN 3.4 06/20/2013   AST 26 06/20/2013   ALT 20 06/20/2013   ALKPHOS 78 06/20/2013   BILITOT 0.6 06/20/2013   GFRNONAA 50 (L) 12/25/2016   GFRAA 58 (L) 12/25/2016    Lab Results  Component Value Date   WBC 4.7 12/25/2016   HGB 12.2 12/25/2016   HCT 34.8 (L) 12/25/2016   MCV 93.5 12/25/2016   PLT 131 (L) 12/25/2016     STUDIES: Ct Angio Chest Pe W Or Wo Contrast  Result Date: 12/24/2016 CLINICAL DATA:  Acute onset of shortness of breath and chronic leg swelling. Initial encounter. EXAM: CT ANGIOGRAPHY CHEST WITH CONTRAST TECHNIQUE: Multidetector CT imaging of the chest  was performed using the standard protocol during bolus administration of intravenous contrast. Multiplanar CT image reconstructions and MIPs were obtained to evaluate the vascular anatomy. CONTRAST:  60 mL of Isovue 370 IV contrast COMPARISON:  CT of the chest performed 08/07/2016, and PET/CT performed 10/30/2015 FINDINGS: Cardiovascular: Note is made of pulmonary embolus within pulmonary arteries to both upper and both lower lobes. The RV/LV ratio of 1.1 corresponds to right heart strain and at least submassive pulmonary embolus. The heart remains borderline normal in size. Diffuse coronary artery calcifications are seen. Mixing artifact is noted along the descending thoracic aorta. Scattered calcification is noted along the descending thoracic aorta. The great vessels are grossly unremarkable, aside from minimal luminal narrowing along the proximal left subclavian artery due to calcific atherosclerotic disease. Mediastinum/Nodes: Trace pericardial fluid remains within normal limits. No mediastinal  lymphadenopathy is seen. The thyroid gland is unremarkable. No axillary lymphadenopathy is seen. Lungs/Pleura: Mild peripheral nodular scarring and fibrotic change are seen. A small 4 mm nodule is noted at the right upper lobe (image 33 of 96). A vague clump of nodules measuring 1.4 cm is again noted at the left lung base. None of these demonstrated abnormal FDG uptake on prior PET/CT, and they appear relatively stable from prior studies. No pleural effusion or pneumothorax is identified. Upper Abdomen: The visualized portions of the liver and gallbladder are grossly unremarkable. The spleen is borderline normal in size. The visualized portions of the pancreas and adrenal glands are within normal limits. A left renal cyst is partially imaged. Scattered calcification is seen along the proximal superior mesenteric artery, with mild luminal narrowing. Musculoskeletal: No acute osseous abnormalities are identified. Mild  degenerative change is noted at the lower cervical spine, and at the right glenohumeral joint. The visualized musculature is unremarkable in appearance. Review of the MIP images confirms the above findings. IMPRESSION: 1. Pulmonary embolus within pulmonary arteries to both upper and both lower lung lobes. Positive for acute PE with CT evidence of right heart strain (RV/LV Ratio = 1.1) consistent with at least submassive (intermediate risk) PE. The presence of right heart strain has been associated with an increased risk of morbidity and mortality. 2. Diffuse coronary artery calcifications seen. 3. Mild peripheral nodular scarring and fibrotic change. 4 mm nodule at the right upper lobe. Vague clump of nodules again noted at the left lung base, measuring 1.4 cm. These appear relatively stable from prior studies, did not demonstrate abnormal FDG uptake on prior PET/CT, and are likely benign. 4. Scattered calcification along the proximal superior mesenteric artery, with mild luminal narrowing. 5. Left renal cyst noted. Critical Value/emergent results were called by telephone at the time of interpretation on 12/24/2016 at 5:55 pm to Dr. Hewitt Blade, who verbally acknowledged these results. Electronically Signed   By: Garald Balding M.D.   On: 12/24/2016 17:58   US Venous Img Lower Bilateral  Result Date: 12/25/2016 CLINICAL DATA:  Bilateral pulmonary emboli on recent CT. EXAM: BILATERAL LOWER EXTREMITY VENOUS DOPPLER ULTRASOUND TECHNIQUE: Gray-scale sonography with compression, as well as color and duplex ultrasound, were performed to evaluate the deep venous system from the level of the common femoral vein through the popliteal and proximal calf veins. COMPARISON:  None FINDINGS: On the right, Normal compressibility of the common femoral, superficial femoral, and popliteal veins, as well as the proximal calf veins. No filling defects to suggest DVT on grayscale or color Doppler imaging. Doppler waveforms show normal  direction of venous flow, normal respiratory phasicity and response to augmentation. On the left, there is occlusive thrombus in posterior tibial and peroneal veins extending through the popliteal vein. There is extension of partially occlusive thrombus into the femoral vein. Common femoral vein remains widely patent incompressible. Saphenofemoral junction patent. Profunda femoral vein patent. IMPRESSION: 1. POSITIVE for LEFT calf and femoral-popliteal acute DVT. 2. Negative for right lower extremity DVT. Electronically Signed   By: Lucrezia Europe M.D.   On: 12/25/2016 15:09    ASSESSMENT: Pulmonary embolism, left popliteal DVT  PLAN:    1. Pulmonary embolism, left popliteal DVT: CT and ultrasound results reviewed independently and reported as above with a submassive bilateral PE along with left leg DVT. Patient had no obvious transient risk factors. Her hypercoagulable workup is essentially negative except for an elevated DRVVT which can be attributed to her actively taking anticoagulation. Underlying malignancy  is also possibility, but patient expressed little interest in further workup given her advanced age. Patient will require 6 months of anticoagulation completing in approximately August 2018. She also expressed interest in minimizing physician follow-up, has requested no further follow-up and to be managed by her primary care physician.  Approximately 45 minutes was spent in discussion of which greater than 50% was consultation.   Patient expressed understanding and was in agreement with this plan. She also understands that She can call clinic at any time with any questions, concerns, or complaints.    Lloyd Huger, MD   01/20/2017 10:40 PM

## 2017-01-15 ENCOUNTER — Encounter: Payer: Self-pay | Admitting: Oncology

## 2017-01-15 ENCOUNTER — Inpatient Hospital Stay: Payer: Medicare Other | Attending: Oncology | Admitting: Oncology

## 2017-01-15 ENCOUNTER — Inpatient Hospital Stay: Payer: Medicare Other

## 2017-01-15 VITALS — BP 166/91 | HR 108 | Temp 97.5°F | Ht 65.35 in | Wt 166.1 lb

## 2017-01-15 DIAGNOSIS — Z8051 Family history of malignant neoplasm of kidney: Secondary | ICD-10-CM | POA: Insufficient documentation

## 2017-01-15 DIAGNOSIS — Z7901 Long term (current) use of anticoagulants: Secondary | ICD-10-CM | POA: Insufficient documentation

## 2017-01-15 DIAGNOSIS — E039 Hypothyroidism, unspecified: Secondary | ICD-10-CM | POA: Insufficient documentation

## 2017-01-15 DIAGNOSIS — N281 Cyst of kidney, acquired: Secondary | ICD-10-CM | POA: Diagnosis not present

## 2017-01-15 DIAGNOSIS — I2699 Other pulmonary embolism without acute cor pulmonale: Secondary | ICD-10-CM | POA: Diagnosis present

## 2017-01-15 DIAGNOSIS — M3500 Sicca syndrome, unspecified: Secondary | ICD-10-CM | POA: Diagnosis not present

## 2017-01-15 DIAGNOSIS — Z79899 Other long term (current) drug therapy: Secondary | ICD-10-CM | POA: Diagnosis not present

## 2017-01-15 DIAGNOSIS — Z809 Family history of malignant neoplasm, unspecified: Secondary | ICD-10-CM | POA: Insufficient documentation

## 2017-01-15 DIAGNOSIS — E785 Hyperlipidemia, unspecified: Secondary | ICD-10-CM

## 2017-01-15 DIAGNOSIS — I998 Other disorder of circulatory system: Secondary | ICD-10-CM | POA: Diagnosis not present

## 2017-01-15 DIAGNOSIS — Z801 Family history of malignant neoplasm of trachea, bronchus and lung: Secondary | ICD-10-CM | POA: Insufficient documentation

## 2017-01-15 DIAGNOSIS — I10 Essential (primary) hypertension: Secondary | ICD-10-CM | POA: Insufficient documentation

## 2017-01-15 DIAGNOSIS — I82432 Acute embolism and thrombosis of left popliteal vein: Secondary | ICD-10-CM | POA: Diagnosis not present

## 2017-01-15 DIAGNOSIS — M069 Rheumatoid arthritis, unspecified: Secondary | ICD-10-CM | POA: Diagnosis not present

## 2017-01-15 NOTE — Progress Notes (Signed)
Here  Referred by Dr Caryl Comes for [facility]- vs retaken and BP 159/89,p66 ( after resting )

## 2017-01-16 LAB — ANTITHROMBIN III: AntiThromb III Func: 115 % (ref 75–120)

## 2017-01-16 LAB — HOMOCYSTEINE: Homocysteine: 18.9 umol/L — ABNORMAL HIGH (ref 0.0–15.0)

## 2017-01-17 LAB — BETA-2-GLYCOPROTEIN I ABS, IGG/M/A
Beta-2 Glyco I IgG: <9 GPI IgG units (ref 0–20)
Beta-2-Glycoprotein I IgA: <9 GPI IgA units (ref 0–25)
Beta-2-Glycoprotein I IgM: 10 GPI IgM units (ref 0–32)

## 2017-01-17 LAB — LUPUS ANTICOAGULANT PANEL
DRVVT: 68.4 s — AB (ref 0.0–47.0)
PTT LA: 39.2 s (ref 0.0–51.9)

## 2017-01-17 LAB — CARDIOLIPIN ANTIBODIES, IGG, IGM, IGA
Anticardiolipin IgA: <9 APL U/mL (ref 0–11)
Anticardiolipin IgG: <9 GPL U/mL (ref 0–14)
Anticardiolipin IgM: 10 MPL U/mL (ref 0–12)

## 2017-01-17 LAB — DRVVT CONFIRM: dRVVT Confirm: 1 ratio (ref 0.8–1.2)

## 2017-01-17 LAB — DRVVT MIX: DRVVT MIX: 52.3 s — AB (ref 0.0–47.0)

## 2017-01-20 LAB — PROTHROMBIN GENE MUTATION

## 2017-05-12 ENCOUNTER — Ambulatory Visit
Admission: RE | Admit: 2017-05-12 | Discharge: 2017-05-12 | Disposition: A | Discharge status: Home / Self Care | Payer: Medicare Other | Source: Ambulatory Visit | Attending: Internal Medicine | Admitting: Internal Medicine

## 2017-05-12 ENCOUNTER — Other Ambulatory Visit: Payer: Self-pay | Admitting: Internal Medicine

## 2017-05-12 DIAGNOSIS — G319 Degenerative disease of nervous system, unspecified: Secondary | ICD-10-CM | POA: Insufficient documentation

## 2017-05-12 DIAGNOSIS — S0003XA Contusion of scalp, initial encounter: Secondary | ICD-10-CM | POA: Diagnosis not present

## 2017-05-12 DIAGNOSIS — S0990XA Unspecified injury of head, initial encounter: Secondary | ICD-10-CM

## 2017-05-12 DIAGNOSIS — Y92009 Unspecified place in unspecified non-institutional (private) residence as the place of occurrence of the external cause: Secondary | ICD-10-CM | POA: Diagnosis not present

## 2017-05-12 DIAGNOSIS — W19XXXA Unspecified fall, initial encounter: Secondary | ICD-10-CM | POA: Diagnosis not present

## 2017-05-12 DIAGNOSIS — I6523 Occlusion and stenosis of bilateral carotid arteries: Secondary | ICD-10-CM | POA: Diagnosis not present

## 2017-05-12 DIAGNOSIS — I6782 Cerebral ischemia: Secondary | ICD-10-CM | POA: Insufficient documentation

## 2017-08-18 ENCOUNTER — Other Ambulatory Visit
Admission: RE | Admit: 2017-08-18 | Discharge: 2017-08-18 | Disposition: A | Discharge status: Home / Self Care | Payer: Medicare Other | Source: Ambulatory Visit | Attending: Internal Medicine | Admitting: Internal Medicine

## 2017-08-18 DIAGNOSIS — Z86718 Personal history of other venous thrombosis and embolism: Secondary | ICD-10-CM | POA: Diagnosis present

## 2017-08-18 LAB — FIBRIN DERIVATIVES D-DIMER (ARMC ONLY): FIBRIN DERIVATIVES D-DIMER (ARMC): 3922.56 — AB (ref 0.00–499.00)

## 2017-08-19 NOTE — Progress Notes (Signed)
[name]  Telephone:(336) [phone] Fax:(336) [phone]  ID: Carol Bryan OB: [birth year]  MR#: [id number]  MLY#:[id number]  Patient Care Team: Adin Hector, MD as PCP - General (Internal Medicine)  CHIEF COMPLAINT: Pancytopenia, PE/DVT.  INTERVAL HISTORY: Patient is referred back to clinic for further evaluation after routine laboratory work revealed worsening pancytopenia. She currently feels well and is asymptomatic. She has no neurologic complaints. She denies any recent fevers. She has a good appetite and denies weight loss. She denies any chest pain or shortness of breath. She has no cough or hemoptysis. She denies any nausea, vomiting, constipation, or diarrhea. She has no urinary complaints. Patient feels at her baseline and offers no specific complaints today.  REVIEW OF SYSTEMS:   Review of Systems  Constitutional: Negative.  Negative for fever, malaise/fatigue and weight loss.  Respiratory: Negative.  Negative for cough, hemoptysis and shortness of breath.   Cardiovascular: Negative.  Negative for chest pain and leg swelling.  Gastrointestinal: Negative.  Negative for abdominal pain.  Genitourinary: Negative.   Musculoskeletal: Negative.   Neurological: Negative.  Negative for sensory change and weakness.  Endo/Heme/Allergies: Does not bruise/bleed easily.  Psychiatric/Behavioral: Negative.  The patient is not nervous/anxious.     As per HPI. Otherwise, a complete review of systems is negative.  PAST MEDICAL HISTORY: Past Medical History:  Diagnosis Date  . Collagen vascular disease (HCC)    RA  . HLD (hyperlipidemia)   . Hypertension   . Hypothyroid   . RA (rheumatoid arthritis) ([place])   . Sjogren's disease ([place])     PAST SURGICAL HISTORY: Past Surgical History:  Procedure Laterality Date  . ABDOMINAL HYSTERECTOMY    . APPENDECTOMY    . ASD REPAIR  age 77  . JOINT REPLACEMENT Left    elbow  . PATENT DUCTUS ARTERIOUS REPAIR       FAMILY HISTORY: Family History  Problem Relation Age of Onset  . Cancer Brother   . Stroke Mother   . Kidney cancer Father   . Stroke Sister   . Cancer Sister   . Lung cancer Sister   . Liver cancer Sister     ADVANCED DIRECTIVES (Y/N):  N  HEALTH MAINTENANCE: Social History  Substance Use Topics  . Smoking status: Never Smoker  . Smokeless tobacco: Never Used  . Alcohol use Yes     Comment: occasionally      Colonoscopy:  PAP:  Bone density:  Lipid panel:  Allergies  Allergen Reactions  . Ciprofloxacin Other (See Comments)    Foot burning; tolerates levaquin  . Methotrexate Other (See Comments)    leukopenia  . Gold-Containing Drug Products Rash  . Sulfa Antibiotics Rash    Current Outpatient Prescriptions  Medication Sig Dispense Refill  . amitriptyline (ELAVIL) 10 MG tablet Take 20 mg by mouth daily.    Marland Kitchen apixaban (ELIQUIS) 5 MG TABS tablet Take 1 tablet (5 mg total) by mouth 2 (two) times daily. 60 tablet 1  . azelastine (ASTELIN) 0.1 % nasal spray Place into the nose.    . diphenhydramine-acetaminophen (TYLENOL PM) 25-500 MG TABS tablet Take 1 tablet by mouth at bedtime as needed.    . folic acid (FOLVITE) 1 MG tablet Take 1 mg by mouth 2 (two) times daily.     . furosemide (LASIX) 40 MG tablet Take 40 mg by mouth every other day. Alternates with 80 mg    . hydroxychloroquine (PLAQUENIL) 200 MG tablet Take 200 mg by mouth  daily.    . levothyroxine (SYNTHROID, LEVOTHROID) 50 MCG tablet Take 50 mcg by mouth daily before breakfast.    . losartan (COZAAR) 25 MG tablet Take 25 mg by mouth daily.    . potassium chloride SA (K-DUR,KLOR-CON) 20 MEQ tablet Take 20 mEq by mouth daily.    . prednisoLONE 5 MG TABS tablet Take 7.5 mg by mouth daily.    . Vitamin D, Ergocalciferol, (DRISDOL) 50000 units CAPS capsule Take 50,000 Units by mouth every 7 (seven) days.    . methotrexate (RHEUMATREX) 2.5 MG tablet Take 2.5 mg by mouth once a week.  Friday  Caution:Chemotherapy. Protect from light.     No current facility-administered medications for this visit.     OBJECTIVE: Vitals:   08/20/17 1532  BP: 129/83  Pulse: 98     Body mass index is 26.37 kg/m.    ECOG FS:0 - Asymptomatic  General: Well-developed, well-nourished, no acute distress. Eyes: Pink conjunctiva, anicteric sclera. Lungs: Clear to auscultation bilaterally. Heart: Regular rate and rhythm. No rubs, murmurs, or gallops. Abdomen: Soft, nontender, nondistended. No organomegaly noted, normoactive bowel sounds. Musculoskeletal: No edema, cyanosis, or clubbing. Neuro: Alert, answering all questions appropriately. Cranial nerves grossly intact. Skin: No rashes or petechiae noted. Psych: Normal affect.   LAB RESULTS:  Lab Results  Component Value Date   NA 142 12/25/2016   K 3.5 12/25/2016   CL 106 12/25/2016   CO2 27 12/25/2016   GLUCOSE 93 12/25/2016   BUN 20 12/25/2016   CREATININE 0.96 12/25/2016   CALCIUM 8.5 (L) 12/25/2016   PROT 6.5 06/20/2013   ALBUMIN 3.4 06/20/2013   AST 26 06/20/2013   ALT 20 06/20/2013   ALKPHOS 78 06/20/2013   BILITOT 0.6 06/20/2013   GFRNONAA 50 (L) 12/25/2016   GFRAA 58 (L) 12/25/2016    Lab Results  Component Value Date   WBC 3.3 (L) 08/20/2017   HGB 11.9 (L) 08/20/2017   HCT 35.1 08/20/2017   MCV 90.1 08/20/2017   PLT 135 (L) 08/20/2017     STUDIES: No results found.  ASSESSMENT: Pancytopenia, PE/DVT.  PLAN:    1. Pancytopenia: Patient's white blood cell count, hemoglobin, and platelet count are all mildly decreased. The remainder of her blood work including iron stores, B 12, folate, reticulocyte count, and hemolysis labs are all within normal limits. Antineutrophil antibodies were drawn for completeness and are pending at time of dictation. No intervention is needed at this time. Patient does not require bone marrow biopsy. Return to clinic in 6 weeks with repeat laboratory work and further  evaluation. 2. Pulmonary embolism, left popliteal DVT: CT and ultrasound results reviewed independently with a submassive bilateral PE along with left leg DVT. Patient had no obvious transient risk factors. Her hypercoagulable workup is essentially negative except for an elevated DRVVT which can be attributed to her actively taking anticoagulation. Underlying malignancy is also possibility, but patient expressed little interest in further workup given her advanced age. Patient recently had a positive d-dimer at her primary care physician and was continued on anticoagulation.   Approximately 30 minutes was spent in discussion of which greater than 50% was consultation.   Patient expressed understanding and was in agreement with this plan. She also understands that She can call clinic at any time with any questions, concerns, or complaints.    Lloyd Huger, MD   08/24/2017 9:17 AM

## 2017-08-20 ENCOUNTER — Encounter: Payer: Self-pay | Admitting: Oncology

## 2017-08-20 ENCOUNTER — Inpatient Hospital Stay: Payer: Medicare Other

## 2017-08-20 ENCOUNTER — Inpatient Hospital Stay: Payer: Medicare Other | Attending: Oncology | Admitting: Oncology

## 2017-08-20 VITALS — BP 129/83 | HR 98 | Wt 160.2 lb

## 2017-08-20 DIAGNOSIS — M129 Arthropathy, unspecified: Secondary | ICD-10-CM | POA: Insufficient documentation

## 2017-08-20 DIAGNOSIS — Z801 Family history of malignant neoplasm of trachea, bronchus and lung: Secondary | ICD-10-CM

## 2017-08-20 DIAGNOSIS — Z7901 Long term (current) use of anticoagulants: Secondary | ICD-10-CM

## 2017-08-20 DIAGNOSIS — Z809 Family history of malignant neoplasm, unspecified: Secondary | ICD-10-CM | POA: Insufficient documentation

## 2017-08-20 DIAGNOSIS — Z86718 Personal history of other venous thrombosis and embolism: Secondary | ICD-10-CM | POA: Diagnosis not present

## 2017-08-20 DIAGNOSIS — E785 Hyperlipidemia, unspecified: Secondary | ICD-10-CM | POA: Diagnosis not present

## 2017-08-20 DIAGNOSIS — D61818 Other pancytopenia: Secondary | ICD-10-CM | POA: Diagnosis not present

## 2017-08-20 DIAGNOSIS — E559 Vitamin D deficiency, unspecified: Secondary | ICD-10-CM | POA: Insufficient documentation

## 2017-08-20 DIAGNOSIS — I899 Noninfective disorder of lymphatic vessels and lymph nodes, unspecified: Secondary | ICD-10-CM | POA: Diagnosis not present

## 2017-08-20 DIAGNOSIS — Z79899 Other long term (current) drug therapy: Secondary | ICD-10-CM | POA: Diagnosis not present

## 2017-08-20 DIAGNOSIS — M069 Rheumatoid arthritis, unspecified: Secondary | ICD-10-CM | POA: Diagnosis not present

## 2017-08-20 DIAGNOSIS — I10 Essential (primary) hypertension: Secondary | ICD-10-CM

## 2017-08-20 DIAGNOSIS — Z86711 Personal history of pulmonary embolism: Secondary | ICD-10-CM | POA: Insufficient documentation

## 2017-08-20 DIAGNOSIS — Z800 Family history of malignant neoplasm of digestive organs: Secondary | ICD-10-CM | POA: Insufficient documentation

## 2017-08-20 DIAGNOSIS — E039 Hypothyroidism, unspecified: Secondary | ICD-10-CM | POA: Insufficient documentation

## 2017-08-20 DIAGNOSIS — M3500 Sicca syndrome, unspecified: Secondary | ICD-10-CM | POA: Diagnosis not present

## 2017-08-20 DIAGNOSIS — M810 Age-related osteoporosis without current pathological fracture: Secondary | ICD-10-CM | POA: Insufficient documentation

## 2017-08-20 DIAGNOSIS — R609 Edema, unspecified: Secondary | ICD-10-CM | POA: Insufficient documentation

## 2017-08-20 DIAGNOSIS — I2699 Other pulmonary embolism without acute cor pulmonale: Secondary | ICD-10-CM

## 2017-08-20 DIAGNOSIS — K5790 Diverticulosis of intestine, part unspecified, without perforation or abscess without bleeding: Secondary | ICD-10-CM | POA: Insufficient documentation

## 2017-08-20 DIAGNOSIS — R011 Cardiac murmur, unspecified: Secondary | ICD-10-CM | POA: Insufficient documentation

## 2017-08-20 DIAGNOSIS — M0579 Rheumatoid arthritis with rheumatoid factor of multiple sites without organ or systems involvement: Secondary | ICD-10-CM | POA: Insufficient documentation

## 2017-08-20 DIAGNOSIS — I82432 Acute embolism and thrombosis of left popliteal vein: Secondary | ICD-10-CM

## 2017-08-20 DIAGNOSIS — J929 Pleural plaque without asbestos: Secondary | ICD-10-CM | POA: Insufficient documentation

## 2017-08-20 LAB — RETICULOCYTES
RBC.: 3.92 MIL/uL (ref 3.80–5.20)
RETIC COUNT ABSOLUTE: 109.8 10*3/uL (ref 19.0–183.0)
Retic Ct Pct: 2.8 % (ref 0.4–3.1)

## 2017-08-20 LAB — IRON AND TIBC
IRON: 47 ug/dL (ref 28–170)
Saturation Ratios: 14 % (ref 10.4–31.8)
TIBC: 329 ug/dL (ref 250–450)
UIBC: 282 ug/dL

## 2017-08-20 LAB — CBC
HEMATOCRIT: 35.1 % (ref 35.0–47.0)
HEMOGLOBIN: 11.9 g/dL — AB (ref 12.0–16.0)
MCH: 30.7 pg (ref 26.0–34.0)
MCHC: 34.1 g/dL (ref 32.0–36.0)
MCV: 90.1 fL (ref 80.0–100.0)
Platelets: 135 10*3/uL — ABNORMAL LOW (ref 150–440)
RBC: 3.89 MIL/uL (ref 3.80–5.20)
RDW: 15.4 % — AB (ref 11.5–14.5)
WBC: 3.3 10*3/uL — AB (ref 3.6–11.0)

## 2017-08-20 LAB — FERRITIN: FERRITIN: 206 ng/mL (ref 11–307)

## 2017-08-20 LAB — LACTATE DEHYDROGENASE: LDH: 238 U/L — ABNORMAL HIGH (ref 98–192)

## 2017-08-20 LAB — DAT, POLYSPECIFIC AHG (ARMC ONLY): POLYSPECIFIC AHG TEST: NEGATIVE

## 2017-08-20 LAB — VITAMIN B12: Vitamin B-12: 769 pg/mL (ref 180–914)

## 2017-08-21 LAB — HAPTOGLOBIN: HAPTOGLOBIN: 67 mg/dL (ref 34–200)

## 2017-08-21 LAB — PLATELET ANTIBODY PROFILE
Glycoprotein IV Antibody: NEGATIVE
HLA Ab Ser Ql EIA: NEGATIVE
IA/IIA Antibody: NEGATIVE
IB/IX ANTIBODY: NEGATIVE
IIB/IIIA ANTIBODY: NEGATIVE

## 2017-08-26 LAB — NEUTROPHIL AB TEST LEVEL 1: NEUTROPHIL SCR/PANEL INTERP.: POSITIVE — AB

## 2017-09-24 DIAGNOSIS — D61818 Other pancytopenia: Secondary | ICD-10-CM | POA: Insufficient documentation

## 2017-09-24 NOTE — Progress Notes (Signed)
[place]  [phone] [phone]) [phone]  ID: Pilar Grammes OB: [birth year]  MR#: [id number]  JSE#:[id number]  Patient Care Team: Adin Hector, MD as PCP - General (Internal Medicine)  CHIEF COMPLAINT: Pancytopenia, PE/DVT.  INTERVAL HISTORY: Patient returns to clinic today for repeat laboratory work and further evaluation.  She continues to feel well and is asymptomatic. She has no neurologic complaints. She denies any recent fevers or illnesses. She has a good appetite and denies weight loss. She denies any chest pain or shortness of breath. She has no cough or hemoptysis. She denies any nausea, vomiting, constipation, or diarrhea. She has no urinary complaints. Patient feels at her baseline and offers no specific complaints today.  REVIEW OF SYSTEMS:   Review of Systems  Constitutional: Negative.  Negative for fever, malaise/fatigue and weight loss.  Respiratory: Negative.  Negative for cough, hemoptysis and shortness of breath.   Cardiovascular: Negative.  Negative for chest pain and leg swelling.  Gastrointestinal: Negative.  Negative for abdominal pain.  Genitourinary: Negative.   Musculoskeletal: Negative.   Neurological: Negative.  Negative for sensory change and weakness.  Endo/Heme/Allergies: Does not bruise/bleed easily.  Psychiatric/Behavioral: Negative.  The patient is not nervous/anxious.     As per HPI. Otherwise, a complete review of systems is negative.  PAST MEDICAL HISTORY: Past Medical History:  Diagnosis Date  . Collagen vascular disease (HCC)    RA  . HLD (hyperlipidemia)   . Hypertension   . Hypothyroid   . RA (rheumatoid arthritis) ([place])   . Sjogren's disease ([place])     PAST SURGICAL HISTORY: Past Surgical History:  Procedure Laterality Date  . ABDOMINAL HYSTERECTOMY    . APPENDECTOMY    . ASD REPAIR  age 54  . JOINT REPLACEMENT Left    elbow  . PATENT DUCTUS ARTERIOUS REPAIR      FAMILY HISTORY: Family  History  Problem Relation Age of Onset  . Cancer Brother   . Stroke Mother   . Kidney cancer Father   . Stroke Sister   . Cancer Sister   . Lung cancer Sister   . Liver cancer Sister     ADVANCED DIRECTIVES (Y/N):  N  HEALTH MAINTENANCE: Social History   Tobacco Use  . Smoking status: Never Smoker  . Smokeless tobacco: Never Used  Substance Use Topics  . Alcohol use: Yes    Comment: occasionally   . Drug use: No     Colonoscopy:  PAP:  Bone density:  Lipid panel:  Allergies  Allergen Reactions  . Ciprofloxacin Other (See Comments)    Foot burning; tolerates levaquin  . Methotrexate Other (See Comments)    leukopenia  . Gold-Containing Drug Products Rash  . Sulfa Antibiotics Rash    Current Outpatient Medications  Medication Sig Dispense Refill  . amitriptyline (ELAVIL) 10 MG tablet Take 20 mg by mouth daily.    Marland Kitchen apixaban (ELIQUIS) 5 MG TABS tablet Take 1 tablet (5 mg total) by mouth 2 (two) times daily. (Patient taking differently: Take 2.5 mg 2 (two) times daily by mouth. ) 60 tablet 1  . azelastine (ASTELIN) 0.1 % nasal spray Place into the nose.    . diphenhydramine-acetaminophen (TYLENOL PM) 25-500 MG TABS tablet Take 1 tablet by mouth at bedtime as needed.    . folic acid (FOLVITE) 1 MG tablet Take 1 mg 3 (three) times daily by mouth.     . furosemide (LASIX) 40 MG tablet Take 40 mg by mouth  every other day. Alternates with 80 mg    . hydroxychloroquine (PLAQUENIL) 200 MG tablet Take 200 mg by mouth daily.    Marland Kitchen levothyroxine (SYNTHROID, LEVOTHROID) 50 MCG tablet Take 50 mcg by mouth daily before breakfast.    . losartan (COZAAR) 25 MG tablet Take 25 mg by mouth daily.    . potassium chloride SA (K-DUR,KLOR-CON) 20 MEQ tablet Take 20 mEq by mouth daily.    . prednisoLONE 5 MG TABS tablet Take 5 mg 2 (two) times daily by mouth.     . Vitamin D, Ergocalciferol, (DRISDOL) 50000 units CAPS capsule Take 50,000 Units by mouth every 7 (seven) days.    .  methotrexate (RHEUMATREX) 2.5 MG tablet Take 2.5 mg by mouth once a week. Friday  Caution:Chemotherapy. Protect from light.     No current facility-administered medications for this visit.     OBJECTIVE: Vitals:   09/30/17 1520  BP: (!) 148/97  Pulse: 96  Temp: 98.2 F (36.8 C)     Body mass index is 26.19 kg/m.    ECOG FS:0 - Asymptomatic  General: Well-developed, well-nourished, no acute distress. Eyes: Pink conjunctiva, anicteric sclera. Lungs: Clear to auscultation bilaterally. Heart: Regular rate and rhythm. No rubs, murmurs, or gallops. Abdomen: Soft, nontender, nondistended. No organomegaly noted, normoactive bowel sounds. Musculoskeletal: No edema, cyanosis, or clubbing. Neuro: Alert, answering all questions appropriately. Cranial nerves grossly intact. Skin: No rashes or petechiae noted. Psych: Normal affect.   LAB RESULTS:  Lab Results  Component Value Date   NA 142 12/25/2016   K 3.5 12/25/2016   CL 106 12/25/2016   CO2 27 12/25/2016   GLUCOSE 93 12/25/2016   BUN 20 12/25/2016   CREATININE 0.96 12/25/2016   CALCIUM 8.5 (L) 12/25/2016   PROT 6.5 06/20/2013   ALBUMIN 3.4 06/20/2013   AST 26 06/20/2013   ALT 20 06/20/2013   ALKPHOS 78 06/20/2013   BILITOT 0.6 06/20/2013   GFRNONAA 50 (L) 12/25/2016   GFRAA 58 (L) 12/25/2016    Lab Results  Component Value Date   WBC 3.8 09/30/2017   NEUTROABS 2.4 09/30/2017   HGB 12.5 09/30/2017   HCT 37.2 09/30/2017   MCV 91.3 09/30/2017   PLT 151 09/30/2017     STUDIES: No results found.  ASSESSMENT: Pancytopenia, PE/DVT.  PLAN:    1. Pancytopenia: Patient's white blood cell count, hemoglobin, and platelet count are now within normal limits.  Previously, the remainder of her blood work including iron stores, B-12, folate, reticulocyte count, and hemolysis labs are all within normal limits. Antineutrophil antibodies are positive, but given her normal white blood cell count this is likely clinically  insignificant. No intervention is needed at this time. Patient does not require bone marrow biopsy. Return to clinic in 6 months with repeat laboratory work and further evaluation. 2. Pulmonary embolism, left popliteal DVT: CT and ultrasound results reviewed independently with a submassive bilateral PE along with left leg DVT. Patient had no obvious transient risk factors. Her hypercoagulable workup was essentially negative except for an elevated DRVVT which can be attributed to her actively taking anticoagulation. Underlying malignancy is also possibility, but patient expressed little interest in further workup given her advanced age. Patient recently had a positive d-dimer at her primary care physician and was continued on anticoagulation.   Approximately 30 minutes was spent in discussion of which greater than 50% was consultation.   Patient expressed understanding and was in agreement with this plan. She also understands that She can call  clinic at any time with any questions, concerns, or complaints.    Lloyd Huger, MD   09/30/2017 3:43 PM

## 2017-09-30 ENCOUNTER — Inpatient Hospital Stay: Payer: Medicare Other

## 2017-09-30 ENCOUNTER — Inpatient Hospital Stay: Payer: Medicare Other | Attending: Oncology | Admitting: Oncology

## 2017-09-30 VITALS — BP 148/97 | HR 96 | Temp 98.2°F | Wt 159.1 lb

## 2017-09-30 DIAGNOSIS — I2699 Other pulmonary embolism without acute cor pulmonale: Secondary | ICD-10-CM

## 2017-09-30 DIAGNOSIS — M3500 Sicca syndrome, unspecified: Secondary | ICD-10-CM | POA: Diagnosis not present

## 2017-09-30 DIAGNOSIS — Z79899 Other long term (current) drug therapy: Secondary | ICD-10-CM | POA: Insufficient documentation

## 2017-09-30 DIAGNOSIS — E785 Hyperlipidemia, unspecified: Secondary | ICD-10-CM | POA: Diagnosis not present

## 2017-09-30 DIAGNOSIS — Z86711 Personal history of pulmonary embolism: Secondary | ICD-10-CM | POA: Diagnosis not present

## 2017-09-30 DIAGNOSIS — I998 Other disorder of circulatory system: Secondary | ICD-10-CM | POA: Insufficient documentation

## 2017-09-30 DIAGNOSIS — Z86718 Personal history of other venous thrombosis and embolism: Secondary | ICD-10-CM | POA: Diagnosis not present

## 2017-09-30 DIAGNOSIS — Z800 Family history of malignant neoplasm of digestive organs: Secondary | ICD-10-CM | POA: Insufficient documentation

## 2017-09-30 DIAGNOSIS — Z801 Family history of malignant neoplasm of trachea, bronchus and lung: Secondary | ICD-10-CM | POA: Diagnosis not present

## 2017-09-30 DIAGNOSIS — I10 Essential (primary) hypertension: Secondary | ICD-10-CM | POA: Diagnosis not present

## 2017-09-30 DIAGNOSIS — Z8051 Family history of malignant neoplasm of kidney: Secondary | ICD-10-CM | POA: Insufficient documentation

## 2017-09-30 DIAGNOSIS — D61818 Other pancytopenia: Secondary | ICD-10-CM | POA: Diagnosis not present

## 2017-09-30 DIAGNOSIS — M069 Rheumatoid arthritis, unspecified: Secondary | ICD-10-CM | POA: Insufficient documentation

## 2017-09-30 DIAGNOSIS — E039 Hypothyroidism, unspecified: Secondary | ICD-10-CM | POA: Diagnosis not present

## 2017-09-30 DIAGNOSIS — I82432 Acute embolism and thrombosis of left popliteal vein: Secondary | ICD-10-CM

## 2017-09-30 LAB — CBC WITH DIFFERENTIAL/PLATELET
Basophils Absolute: 0 10*3/uL (ref 0–0.1)
Basophils Relative: 0 %
EOS ABS: 0 10*3/uL (ref 0–0.7)
EOS PCT: 0 %
HCT: 37.2 % (ref 35.0–47.0)
Hemoglobin: 12.5 g/dL (ref 12.0–16.0)
LYMPHS ABS: 0.8 10*3/uL — AB (ref 1.0–3.6)
LYMPHS PCT: 22 %
MCH: 30.7 pg (ref 26.0–34.0)
MCHC: 33.7 g/dL (ref 32.0–36.0)
MCV: 91.3 fL (ref 80.0–100.0)
MONOS PCT: 14 %
Monocytes Absolute: 0.5 10*3/uL (ref 0.2–0.9)
Neutro Abs: 2.4 10*3/uL (ref 1.4–6.5)
Neutrophils Relative %: 64 %
PLATELETS: 151 10*3/uL (ref 150–440)
RBC: 4.07 MIL/uL (ref 3.80–5.20)
RDW: 16.2 % — ABNORMAL HIGH (ref 11.5–14.5)
WBC: 3.8 10*3/uL (ref 3.6–11.0)

## 2017-10-29 ENCOUNTER — Inpatient Hospital Stay
Admission: EM | Admit: 2017-10-29 | Discharge: 2017-10-30 | DRG: 683 | Disposition: A | Discharge status: Home Health | Payer: Medicare Other | Attending: Internal Medicine | Admitting: Internal Medicine

## 2017-10-29 ENCOUNTER — Other Ambulatory Visit: Payer: Self-pay

## 2017-10-29 ENCOUNTER — Inpatient Hospital Stay: Payer: Medicare Other

## 2017-10-29 DIAGNOSIS — Z7901 Long term (current) use of anticoagulants: Secondary | ICD-10-CM | POA: Diagnosis not present

## 2017-10-29 DIAGNOSIS — N183 Chronic kidney disease, stage 3 (moderate): Secondary | ICD-10-CM | POA: Diagnosis present

## 2017-10-29 DIAGNOSIS — M069 Rheumatoid arthritis, unspecified: Secondary | ICD-10-CM | POA: Diagnosis present

## 2017-10-29 DIAGNOSIS — M3500 Sicca syndrome, unspecified: Secondary | ICD-10-CM | POA: Diagnosis present

## 2017-10-29 DIAGNOSIS — K5790 Diverticulosis of intestine, part unspecified, without perforation or abscess without bleeding: Secondary | ICD-10-CM | POA: Diagnosis present

## 2017-10-29 DIAGNOSIS — Z8051 Family history of malignant neoplasm of kidney: Secondary | ICD-10-CM | POA: Diagnosis not present

## 2017-10-29 DIAGNOSIS — Z86711 Personal history of pulmonary embolism: Secondary | ICD-10-CM

## 2017-10-29 DIAGNOSIS — Z79890 Hormone replacement therapy (postmenopausal): Secondary | ICD-10-CM | POA: Diagnosis not present

## 2017-10-29 DIAGNOSIS — E876 Hypokalemia: Secondary | ICD-10-CM | POA: Diagnosis present

## 2017-10-29 DIAGNOSIS — R002 Palpitations: Secondary | ICD-10-CM | POA: Diagnosis present

## 2017-10-29 DIAGNOSIS — I4892 Unspecified atrial flutter: Secondary | ICD-10-CM | POA: Diagnosis present

## 2017-10-29 DIAGNOSIS — I129 Hypertensive chronic kidney disease with stage 1 through stage 4 chronic kidney disease, or unspecified chronic kidney disease: Secondary | ICD-10-CM | POA: Diagnosis present

## 2017-10-29 DIAGNOSIS — N179 Acute kidney failure, unspecified: Secondary | ICD-10-CM | POA: Diagnosis present

## 2017-10-29 DIAGNOSIS — Z96622 Presence of left artificial elbow joint: Secondary | ICD-10-CM | POA: Diagnosis present

## 2017-10-29 DIAGNOSIS — I248 Other forms of acute ischemic heart disease: Secondary | ICD-10-CM | POA: Diagnosis present

## 2017-10-29 DIAGNOSIS — I700 Atherosclerosis of aorta: Secondary | ICD-10-CM | POA: Diagnosis present

## 2017-10-29 DIAGNOSIS — Z800 Family history of malignant neoplasm of digestive organs: Secondary | ICD-10-CM | POA: Diagnosis not present

## 2017-10-29 DIAGNOSIS — I471 Supraventricular tachycardia, unspecified: Secondary | ICD-10-CM | POA: Diagnosis present

## 2017-10-29 DIAGNOSIS — M810 Age-related osteoporosis without current pathological fracture: Secondary | ICD-10-CM | POA: Diagnosis present

## 2017-10-29 DIAGNOSIS — Z823 Family history of stroke: Secondary | ICD-10-CM

## 2017-10-29 DIAGNOSIS — E039 Hypothyroidism, unspecified: Secondary | ICD-10-CM | POA: Diagnosis present

## 2017-10-29 DIAGNOSIS — E538 Deficiency of other specified B group vitamins: Secondary | ICD-10-CM | POA: Diagnosis present

## 2017-10-29 DIAGNOSIS — E782 Mixed hyperlipidemia: Secondary | ICD-10-CM | POA: Diagnosis present

## 2017-10-29 DIAGNOSIS — Z801 Family history of malignant neoplasm of trachea, bronchus and lung: Secondary | ICD-10-CM | POA: Diagnosis not present

## 2017-10-29 DIAGNOSIS — R0602 Shortness of breath: Secondary | ICD-10-CM | POA: Diagnosis present

## 2017-10-29 DIAGNOSIS — E860 Dehydration: Secondary | ICD-10-CM | POA: Diagnosis present

## 2017-10-29 HISTORY — DX: Other pulmonary embolism without acute cor pulmonale: I26.99

## 2017-10-29 HISTORY — DX: Acute embolism and thrombosis of unspecified deep veins of unspecified lower extremity: I82.409

## 2017-10-29 LAB — BASIC METABOLIC PANEL
Anion gap: 13 (ref 5–15)
BUN: 51 mg/dL — AB (ref 6–20)
CHLORIDE: 96 mmol/L — AB (ref 101–111)
CO2: 23 mmol/L (ref 22–32)
Calcium: 9.6 mg/dL (ref 8.9–10.3)
Creatinine, Ser: 1.96 mg/dL — ABNORMAL HIGH (ref 0.44–1.00)
GFR calc Af Amer: 24 mL/min — ABNORMAL LOW (ref >60)
GFR calc non Af Amer: 21 mL/min — ABNORMAL LOW (ref >60)
GLUCOSE: 124 mg/dL — AB (ref 65–99)
POTASSIUM: 3.4 mmol/L — AB (ref 3.5–5.1)
Sodium: 132 mmol/L — ABNORMAL LOW (ref 135–145)

## 2017-10-29 LAB — CBC
HEMATOCRIT: 36.4 % (ref 35.0–47.0)
Hemoglobin: 12.4 g/dL (ref 12.0–16.0)
MCH: 31.2 pg (ref 26.0–34.0)
MCHC: 34 g/dL (ref 32.0–36.0)
MCV: 91.9 fL (ref 80.0–100.0)
Platelets: 173 10*3/uL (ref 150–440)
RBC: 3.96 MIL/uL (ref 3.80–5.20)
RDW: 15.9 % — AB (ref 11.5–14.5)
WBC: 8.7 10*3/uL (ref 3.6–11.0)

## 2017-10-29 LAB — MAGNESIUM: Magnesium: 2 mg/dL (ref 1.7–2.4)

## 2017-10-29 LAB — TROPONIN I
TROPONIN I: 0.14 ng/mL — AB (ref <0.03)
Troponin I: 0.17 ng/mL (ref <0.03)
Troponin I: 0.14 ng/mL (ref <0.03)

## 2017-10-29 LAB — TSH: TSH: 1.955 u[IU]/mL (ref 0.350–4.500)

## 2017-10-29 MED ORDER — POTASSIUM CHLORIDE CRYS ER 20 MEQ PO TBCR
20.0000 meq | EXTENDED_RELEASE_TABLET | Freq: Once | ORAL | Status: AC
  Administered 2017-10-29: 20 meq via ORAL
  Filled 2017-10-29: qty 1

## 2017-10-29 MED ORDER — POTASSIUM CHLORIDE CRYS ER 20 MEQ PO TBCR
20.0000 meq | EXTENDED_RELEASE_TABLET | Freq: Every day | ORAL | Status: DC
  Administered 2017-10-30: 20 meq via ORAL
  Filled 2017-10-29: qty 1

## 2017-10-29 MED ORDER — APIXABAN 2.5 MG PO TABS
2.5000 mg | ORAL_TABLET | Freq: Two times a day (BID) | ORAL | Status: DC
  Administered 2017-10-29 – 2017-10-30 (×2): 2.5 mg via ORAL
  Filled 2017-10-29 (×2): qty 1

## 2017-10-29 MED ORDER — AMITRIPTYLINE HCL 10 MG PO TABS
20.0000 mg | ORAL_TABLET | Freq: Every day | ORAL | Status: DC
  Administered 2017-10-29: 20 mg via ORAL
  Filled 2017-10-29 (×2): qty 2

## 2017-10-29 MED ORDER — PREDNISONE 2.5 MG PO TABS
7.5000 mg | ORAL_TABLET | Freq: Every evening | ORAL | Status: DC
  Administered 2017-10-29: 21:00:00 2.5 mg via ORAL
  Filled 2017-10-29: qty 1

## 2017-10-29 MED ORDER — ADENOSINE 6 MG/2ML IV SOLN
6.0000 mg | Freq: Once | INTRAVENOUS | Status: AC
  Administered 2017-10-29: 6 mg via INTRAVENOUS
  Filled 2017-10-29: qty 2

## 2017-10-29 MED ORDER — ACETAMINOPHEN 325 MG PO TABS
650.0000 mg | ORAL_TABLET | Freq: Four times a day (QID) | ORAL | Status: DC | PRN
  Administered 2017-10-30: 650 mg via ORAL
  Filled 2017-10-29: qty 2

## 2017-10-29 MED ORDER — PREDNISOLONE 5 MG PO TABS: 5.0000 mg | ORAL_TABLET | Freq: Two times a day (BID) | ORAL | Status: DC

## 2017-10-29 MED ORDER — HYDROXYCHLOROQUINE SULFATE 200 MG PO TABS
200.0000 mg | ORAL_TABLET | Freq: Every day | ORAL | Status: DC
  Administered 2017-10-30: 200 mg via ORAL
  Filled 2017-10-29: qty 1

## 2017-10-29 MED ORDER — LEVOTHYROXINE SODIUM 50 MCG PO TABS
50.0000 ug | ORAL_TABLET | Freq: Every day | ORAL | Status: DC
  Administered 2017-10-30: 50 ug via ORAL
  Filled 2017-10-29: qty 1

## 2017-10-29 MED ORDER — FOLIC ACID 1 MG PO TABS
3.0000 mg | ORAL_TABLET | Freq: Every day | ORAL | Status: DC
  Administered 2017-10-30: 3 mg via ORAL
  Filled 2017-10-29: qty 3

## 2017-10-29 MED ORDER — KETAMINE HCL 10 MG/ML IJ SOLN
0.5000 mg/kg | Freq: Once | INTRAMUSCULAR | Status: AC
  Administered 2017-10-29: 36 mg via INTRAVENOUS
  Filled 2017-10-29: qty 1

## 2017-10-29 MED ORDER — DIPHENHYDRAMINE-APAP (SLEEP) 25-500 MG PO TABS: 1.0000 | ORAL_TABLET | Freq: Every evening | ORAL | Status: DC | PRN

## 2017-10-29 MED ORDER — DILTIAZEM HCL ER COATED BEADS 180 MG PO CP24
180.0000 mg | ORAL_CAPSULE | Freq: Every day | ORAL | Status: DC
  Administered 2017-10-29 – 2017-10-30 (×2): 180 mg via ORAL
  Filled 2017-10-29 (×2): qty 1

## 2017-10-29 MED ORDER — ACETAMINOPHEN 650 MG RE SUPP: 650.0000 mg | Freq: Four times a day (QID) | RECTAL | Status: DC | PRN

## 2017-10-29 MED ORDER — SODIUM CHLORIDE 0.9 % IV SOLN
INTRAVENOUS | Status: DC
  Administered 2017-10-29: 18:00:00 via INTRAVENOUS

## 2017-10-29 MED ORDER — ADENOSINE 12 MG/4ML IV SOLN
INTRAVENOUS | Status: AC
  Filled 2017-10-29: qty 4

## 2017-10-29 MED ORDER — DIPHENHYDRAMINE HCL 25 MG PO CAPS: 25.0000 mg | ORAL_CAPSULE | Freq: Every evening | ORAL | Status: DC | PRN

## 2017-10-29 MED ORDER — AZELASTINE HCL 0.1 % NA SOLN
1.0000 | Freq: Two times a day (BID) | NASAL | Status: DC
  Filled 2017-10-29: qty 30

## 2017-10-29 MED ORDER — PREDNISONE 10 MG PO TABS
5.0000 mg | ORAL_TABLET | Freq: Every day | ORAL | Status: DC
  Administered 2017-10-30: 5 mg via ORAL
  Filled 2017-10-29: qty 1

## 2017-10-29 MED ORDER — ACETAMINOPHEN 500 MG PO TABS: 500.0000 mg | ORAL_TABLET | Freq: Every evening | ORAL | Status: DC | PRN

## 2017-10-29 NOTE — ED Triage Notes (Signed)
Chest discomfort since Friday, SOB. Pt sent from [facility] for SVT. Pt alert and oriented X4, active, cooperative, pt in NAD. RR even and unlabored, color WNL.

## 2017-10-29 NOTE — H&P (Signed)
[facility] [place] NAME: Carol Bryan    MR#:  [id number]  DATE OF BIRTH:  [birth year]  DATE OF ADMISSION:  10/29/2017  PRIMARY CARE PHYSICIAN: Adin Hector, MD   REQUESTING/REFERRING PHYSICIAN: Dr Archie Balboa  CHIEF COMPLAINT:   Chief Complaint  Patient presents with  . Palpitations    HISTORY OF PRESENT ILLNESS:  Carol Bryan  is a 83 y.o. female with history of rheumatoid arthritis, pulmonary embolism in the past.  She had nausea vomiting and diarrhea on Monday.  The nausea vomiting settled down but still having some diarrhea.  Yesterday she felt so weak she can hardly get up.  She had a fall last night.  She had a pain around her back into her throat last night.  She was not getting any better.  She has felt her heart beating fast.  She was seen in the [place] clinic and then referred to the emergency room.  In the ER, she was found to be in SVT with a fast heart rate.  ER physician gave some adenosine and then the rhythm looks like atrial flutter.  The patient was cardioverted and is now in normal sinus rhythm.  Creatinine was found to be higher than usual.  PAST MEDICAL HISTORY:   Past Medical History:  Diagnosis Date  . Collagen vascular disease (HCC)    RA  . DVT (deep venous thrombosis) ([place])   . HLD (hyperlipidemia)   . Hypertension   . Hypothyroid   . Pulmonary embolism ([place])   . RA (rheumatoid arthritis) ([place])   . Sjogren's disease ([place])     PAST SURGICAL HISTORY:   Past Surgical History:  Procedure Laterality Date  . ABDOMINAL HYSTERECTOMY    . APPENDECTOMY    . ASD REPAIR  age 6  . cataracts    . JOINT REPLACEMENT Left    elbow  . PATENT DUCTUS ARTERIOUS REPAIR      SOCIAL HISTORY:   Social History   Tobacco Use  . Smoking status: Never Smoker  . Smokeless tobacco: Never Used  Substance Use Topics  . Alcohol use: Yes    Comment: occasionally     FAMILY HISTORY:   Family History   Problem Relation Age of Onset  . Cancer Brother   . Stroke Mother   . Dementia Mother   . Kidney cancer Father   . Stroke Sister   . Cancer Sister   . Lung cancer Sister   . Liver cancer Sister     DRUG ALLERGIES:   Allergies  Allergen Reactions  . Ciprofloxacin Other (See Comments)    Foot burning; tolerates levaquin  . Methotrexate Other (See Comments)    leukopenia  . Gold-Containing Drug Products Rash  . Sulfa Antibiotics Rash    REVIEW OF SYSTEMS:  CONSTITUTIONAL: No fever, chills or sweats.  Positive for fatigue.  EYES: No blurred or double vision.  Wears glasses. EARS, NOSE, AND THROAT: No tinnitus or ear pain. No sore throat.  Wears hearing aids. RESPIRATORY: No cough, shortness of breath, wheezing or hemoptysis.  CARDIOVASCULAR: No chest pain.  Positive for palpitation. GASTROINTESTINAL: Positive for nausea, vomiting, and diarrhea.  No abdominal pain. No blood in bowel movements GENITOURINARY: No dysuria, hematuria.  ENDOCRINE: No polyuria, nocturia, positive for thyroid issue HEMATOLOGY: No anemia, easy bruising or bleeding SKIN: No rash or lesion. MUSCULOSKELETAL: Positive for joint pains NEUROLOGIC: No tingling, numbness, weakness.  PSYCHIATRY: No anxiety or depression.  MEDICATIONS AT HOME:   Prior to Admission medications   Medication Sig Start Date End Date Taking? Authorizing Provider  amitriptyline (ELAVIL) 10 MG tablet Take 20 mg by mouth daily. 07/31/16   [provider]  apixaban (ELIQUIS) 5 MG TABS tablet Take 1 tablet (5 mg total) by mouth 2 (two) times daily. Patient taking differently: Take 2.5 mg 2 (two) times daily by mouth.  01/01/17   Henreitta Leber, MD  azelastine (ASTELIN) 0.1 % nasal spray Place into the nose. 06/19/16   [provider]  diphenhydramine-acetaminophen (TYLENOL PM) 25-500 MG TABS tablet Take 1 tablet by mouth at bedtime as needed.    [provider]  folic acid (FOLVITE) 1 MG tablet Take 1 mg 3  (three) times daily by mouth.  01/30/16   [provider]  furosemide (LASIX) 40 MG tablet Take 40 mg by mouth every other day. Alternates with 80 mg    [provider]  hydroxychloroquine (PLAQUENIL) 200 MG tablet Take 200 mg by mouth daily.    [provider]  levothyroxine (SYNTHROID, LEVOTHROID) 50 MCG tablet Take 50 mcg by mouth daily before breakfast.    [provider]  losartan (COZAAR) 25 MG tablet Take 25 mg by mouth daily.    [provider]  methotrexate (RHEUMATREX) 2.5 MG tablet Take 2.5 mg by mouth once a week. Friday  Caution:Chemotherapy. Protect from light.    [provider]  potassium chloride SA (K-DUR,KLOR-CON) 20 MEQ tablet Take 20 mEq by mouth daily.    [provider]  prednisoLONE 5 MG TABS tablet Take 5 mg 2 (two) times daily by mouth.     [provider]  Vitamin D, Ergocalciferol, (DRISDOL) 50000 units CAPS capsule Take 50,000 Units by mouth every 7 (seven) days. 10/03/16   [provider]   Medication reconciliation still undergoing.  VITAL SIGNS:  Blood pressure (!) 147/94, pulse 92, resp. rate 19, weight 72.6 kg (160 lb), SpO2 100 %.  PHYSICAL EXAMINATION:  GENERAL:  83 y.o.-year-old patient lying in the bed with no acute distress.  EYES: Pupils equal, round, reactive to light and accommodation. No scleral icterus. Extraocular muscles intact.  HEENT: Head atraumatic, normocephalic. Oropharynx and nasopharynx clear.  NECK:  Supple, no jugular venous distention. No thyroid enlargement, no tenderness.  LUNGS: Normal breath sounds bilaterally, no wheezing, rales,rhonchi or crepitation. No use of accessory muscles of respiration.  CARDIOVASCULAR: S1, S2 normal. No murmurs, rubs, or gallops.  ABDOMEN: Soft, nontender, nondistended. Bowel sounds present. No organomegaly or mass.  EXTREMITIES: No pedal edema, cyanosis, or clubbing.  NEUROLOGIC: Cranial nerves II through XII are intact.  Muscle strength 5/5 in all extremities. Sensation intact. Gait not checked.  PSYCHIATRIC: The patient is alert and oriented x 3.  SKIN: No rash, lesion, or ulcer.   LABORATORY PANEL:   CBC Recent Labs  Lab 10/29/17 1456  WBC 8.7  HGB 12.4  HCT 36.4  PLT 173   ------------------------------------------------------------------------------------------------------------------  Chemistries  Recent Labs  Lab 10/29/17 1456  NA 132*  K 3.4*  CL 96*  CO2 23  GLUCOSE 124*  BUN 51*  CREATININE 1.96*  CALCIUM 9.6   ------------------------------------------------------------------------------------------------------------------  Cardiac Enzymes No results for input(s): TROPONINI in the last 168 hours. ------------------------------------------------------------------------------------------------------------------  RADIOLOGY:  Chest x-ray ordered by me  EKG:   1.  SVT 157 bpm, left axis deviation, left ventricular hypertrophy 2.  After adenosine looks like atrial flutter. 3.  After cardioversion normal sinus rhythm 92 bpm  IMPRESSION AND PLAN:   1.  Supraventricular tachycardia and atrial flutter.  Patient was cardioverted in the emergency room.  Patient is already on Eliquis for pulmonary embolism.  We will give low-dose Cardizem CD 2.  Acute kidney injury.  Gentle IV fluid hydration.  Baseline creatinine 1.3 and her creatinine today is 1.96. 3.  Diarrhea.  Send off stool studies. 4.  Hypokalemia replace potassium orally.  Check magnesium and replace if low. 5.  History of DVT and pulmonary embolism on Eliquis  6.  Essential hypertension.  Switch BP medications to Cardizem CD 7.  Hypothyroidism unspecified continue her usual medications and check a TSH    All the records are reviewed and case discussed with ED provider. Management plans discussed with the patient, family and they are in agreement.  CODE STATUS: Full code  TOTAL TIME TAKING CARE OF THIS PATIENT: 50  minutes.    Loletha Grayer M.D on 10/29/2017 at 4:18 PM  Between 7am to 6pm - Pager - [phone]  After 6pm call admission pager [phone]  [facility]  [phone]  CC: Primary care physician; Adin Hector, MD

## 2017-10-29 NOTE — ED Provider Notes (Signed)
[facility] Emergency Department Provider Note   ____________________________________________   I have reviewed the triage vital signs and the nursing notes.   HISTORY  Chief Complaint Palpitations   History limited by: Not Limited   HPI Carol Bryan Carol Bryan is a 83 y.o. female who presents to the emergency department today because of concern for fatigue and palpitations.   LOCATION:chest  DURATION:2 days TIMING: constant SEVERITY: severe QUALITY: palpitations CONTEXT: patient states that she started feeling unwell a couple of days ago. Felt like her heart was racing. No history of any SVT or abnormal heart rate in the past.  MODIFYING FACTORS: none ASSOCIATED SYMPTOMS: denies any fever. No significant chest pain.  Per medical record review patient has a history of PEs, is on eliquis.   Past Medical History:  Diagnosis Date  . Collagen vascular disease (HCC)    RA  . HLD (hyperlipidemia)   . Hypertension   . Hypothyroid   . RA (rheumatoid arthritis) ([place])   . Sjogren's disease [facility])     Patient Active Problem List   Diagnosis Date Noted  . Other pancytopenia (Carol Bryan) 09/24/2017  . Arthropathy 08/20/2017  . Diverticulosis 08/20/2017  . Edema 08/20/2017  . Heart murmur 08/20/2017  . Osteoporosis, postmenopausal 08/20/2017  . Pleural thickening 08/20/2017  . Rheumatoid arthritis involving multiple sites with positive rheumatoid factor (Carol Bryan) 08/20/2017  . Sjogren's syndrome (Carol Bryan) 08/20/2017  . Vitamin D deficiency, unspecified 08/20/2017  . Acute deep vein thrombosis (DVT) of popliteal vein of left lower extremity ([place]) 01/14/2017  . Pulmonary embolism ([place]) 12/24/2016  . Essential hypertension 12/24/2016  . Hyperlipidemia, unspecified 12/24/2016  . Hypothyroidism 12/24/2016  . Rheumatoid arteritis 12/24/2016  . History of pulmonary embolism 12/24/2016  . Senile purpura (Carol Bryan) 08/19/2016  . Aortic atherosclerosis (Carol Bryan) 08/08/2016  . B12  deficiency 07/23/2016  . Hypokalemia 07/23/2016  . Neutropenia ([place]) 01/23/2016  . Abnormal positron emission tomography (PET) scan 11/01/2015  . Incidental lung nodule 07/27/2015  . CKD (chronic kidney disease) stage 3, GFR 30-59 ml/min (HCC) 07/19/2015  . Splenic artery aneurysm ([place]) 07/19/2015  . Osteoarthritis 07/20/2014  . Hypothyroidism due to acquired atrophy of thyroid 03/06/2012  . Peptic ulcer disease 03/06/2012  . S/P PDA repair 03/06/2012  . Encounter for long-term (current) use of medications 10/29/2011  . Carpal tunnel syndrome of right wrist 10/28/2011  . Cervical spondylosis 10/28/2011    Past Surgical History:  Procedure Laterality Date  . ABDOMINAL HYSTERECTOMY    . APPENDECTOMY    . ASD REPAIR  age 29  . JOINT REPLACEMENT Left    elbow  . PATENT DUCTUS ARTERIOUS REPAIR      Prior to Admission medications   Medication Sig Start Date End Date Taking? Authorizing Provider  amitriptyline (ELAVIL) 10 MG tablet Take 20 mg by mouth daily. 07/31/16   [provider]  apixaban (ELIQUIS) 5 MG TABS tablet Take 1 tablet (5 mg total) by mouth 2 (two) times daily. Patient taking differently: Take 2.5 mg 2 (two) times daily by mouth.  01/01/17   Henreitta Leber, MD  azelastine (ASTELIN) 0.1 % nasal spray Place into the nose. 06/19/16   [provider]  diphenhydramine-acetaminophen (TYLENOL PM) 25-500 MG TABS tablet Take 1 tablet by mouth at bedtime as needed.    [provider]  folic acid (FOLVITE) 1 MG tablet Take 1 mg 3 (three) times daily by mouth.  01/30/16   [provider]  furosemide (LASIX) 40 MG tablet Take 40 mg  by mouth every other day. Alternates with 80 mg    [provider]  hydroxychloroquine (PLAQUENIL) 200 MG tablet Take 200 mg by mouth daily.    [provider]  levothyroxine (SYNTHROID, LEVOTHROID) 50 MCG tablet Take 50 mcg by mouth daily before breakfast.    [provider]  losartan (COZAAR) 25  MG tablet Take 25 mg by mouth daily.    [provider]  methotrexate (RHEUMATREX) 2.5 MG tablet Take 2.5 mg by mouth once a week. Friday  Caution:Chemotherapy. Protect from light.    [provider]  potassium chloride SA (K-DUR,KLOR-CON) 20 MEQ tablet Take 20 mEq by mouth daily.    [provider]  prednisoLONE 5 MG TABS tablet Take 5 mg 2 (two) times daily by mouth.     [provider]  Vitamin D, Ergocalciferol, (DRISDOL) 50000 units CAPS capsule Take 50,000 Units by mouth every 7 (seven) days. 10/03/16   [provider]    Allergies Ciprofloxacin; Methotrexate; Gold-containing drug products; and Sulfa antibiotics  Family History  Problem Relation Age of Onset  . Cancer Brother   . Stroke Mother   . Kidney cancer Father   . Stroke Sister   . Cancer Sister   . Lung cancer Sister   . Liver cancer Sister     Social History Social History   Tobacco Use  . Smoking status: Never Smoker  . Smokeless tobacco: Never Used  Substance Use Topics  . Alcohol use: Yes    Comment: occasionally   . Drug use: No    Review of Systems Constitutional: No fever/chills Eyes: No visual changes. ENT: No sore throat. Cardiovascular: Positive for palpitations. Respiratory: Denies shortness of breath. Gastrointestinal: No abdominal pain.  No nausea, no vomiting.  No diarrhea.   Genitourinary: Negative for dysuria. Musculoskeletal: Negative for back pain. Skin: Negative for rash. Neurological: Negative for headaches, focal weakness or numbness.  ____________________________________________   PHYSICAL EXAM:  VITAL SIGNS: ED Triage Vitals [10/29/17 1416]  Enc Vitals Group     BP 116/76     Pulse Rate (!) 155     Resp 20     Temp      Temp src      SpO2 96 %     Weight 160 lb (72.6 kg)   Constitutional: Alert and oriented. Well appearing and in no distress. Eyes: Conjunctivae are normal.  ENT   Head: Normocephalic and  atraumatic.   Nose: No congestion/rhinnorhea.   Mouth/Throat: Mucous membranes are moist.   Neck: No stridor. Hematological/Lymphatic/Immunilogical: No cervical lymphadenopathy. Cardiovascular: Tachycardic, regular rhythm.  No murmurs, rubs, or gallops.  Respiratory: Normal respiratory effort without tachypnea nor retractions. Breath sounds are clear and equal bilaterally. No wheezes/rales/rhonchi. Gastrointestinal: Soft and non tender. No rebound. No guarding.  Genitourinary: Deferred Musculoskeletal: Normal range of motion in all extremities. No lower extremity edema. Neurologic:  Normal speech and language. No gross focal neurologic deficits are appreciated.  Skin:  Skin is warm, dry and intact. No rash noted. Psychiatric: Mood and affect are normal. Speech and behavior are normal. Patient exhibits appropriate insight and judgment.  ____________________________________________    LABS (pertinent positives/negatives)  BMP cr 1.96, na 132 CBC wnl except rdw 15.9 Trop 0.17 ____________________________________________   EKG  I, Nance Pear, attending physician, personally viewed and interpreted this EKG  EKG Time: 1440 Rate: 156 Rhythm: SVT Axis: left axis deviation Intervals: qtc 493 QRS: narrow ST changes: no st elevation Impression: abnormal ekg  I, Carter Kitten  Archie Balboa, attending physician, personally viewed and interpreted this EKG  EKG Time: 1546 Rate: 92 Rhythm: sinus rhythm Axis: normal axis Intervals: qtc 435 QRS: narrow ST changes: no st elevation Impression: abnormal ekg  ____________________________________________    RADIOLOGY  None  ____________________________________________   PROCEDURES  .Sedation Date/Time: 10/30/2017 4:52 PM Performed by: Nance Pear, MD Authorized by: Nance Pear, MD   Consent:    Consent obtained:  Written (electronic informed consent)   Consent given by:  Patient   Risks discussed:  Allergic  reaction, dysrhythmia, inadequate sedation, nausea, vomiting, respiratory compromise necessitating ventilatory assistance and intubation, prolonged sedation necessitating reversal and prolonged hypoxia resulting in organ damage   Alternatives discussed: Medical management. Universal protocol:    Procedure explained and questions answered to patient or proxy's satisfaction: yes     Relevant documents present and verified: yes     Test results available and properly labeled: yes     Imaging studies available: yes     Required blood products, implants, devices, and special equipment available: yes     Immediately prior to procedure a time out was called: yes     Patient identity confirmation method:  Arm band Indications:    Procedure performed:  Cardioversion   Procedure necessitating sedation performed by:  Physician performing sedation   Intended level of sedation:  Moderate (conscious sedation) Pre-sedation assessment:    Time since last food or drink:  Unknown   NPO status caution: urgency dictates proceeding with non-ideal NPO status     ASA classification: class 2 - patient with mild systemic disease     Mallampati score:  II - soft palate, uvula, fauces visible   Pre-sedation assessments completed and reviewed: airway patency, cardiovascular function, hydration status, mental status, nausea/vomiting, pain level, respiratory function and temperature   Immediate pre-procedure details:    Reassessment: Patient reassessed immediately prior to procedure     Reviewed: vital signs, relevant labs/tests and NPO status     Verified: bag valve mask available, emergency equipment available, intubation equipment available, IV patency confirmed, oxygen available, reversal medications available and suction available   Procedure details (see MAR for exact dosages):    Intra-procedure monitoring:  Blood pressure monitoring, continuous pulse oximetry, cardiac monitor, frequent vital sign checks and  frequent LOC assessments   Total Provider sedation time (minutes):  20 Post-procedure details:    Attendance: Constant attendance by certified staff until patient recovered     Recovery: Patient returned to pre-procedure baseline     Post-sedation assessments completed and reviewed: airway patency, cardiovascular function, hydration status, mental status and respiratory function     Patient is stable for discharge or admission: yes     Patient tolerance:  Tolerated well, no immediate complications    ELECTRIC CARDIOVERSION Performed by: Nance Pear Consent:  The procedure was performed in an emergent situation.  Verbal consent was obtained.  Written consent was obtained. Risks and benefits: risks, benefits and alternatives were discussed Consent given by: patient Patient understanding: patient states understanding of the procedure being performed Patient consent: the patient's understanding of the procedure matches consent given Required items: required blood products, implants, devices, and special equipment available Patient identity confirmed: with wristband Patient sedated: yes Sedation type: moderate (conscious) sedation Sedatives: ketamine Analgesia: ketamine Cardioversion basis: emergent Pre-procedure rhythm: atrial flutter Patient position: patient was placed in a supine position Chest area: chest area exposed Electrodes: pads Electrodes placed: anterior-posterior Number of attempts: 1 Attempt 1 mode: synchronous Attempt 1 waveform: biphasic Attempt  1 shock (in Joules): 150 Attempt 1 outcome: conversion to normal sinus rhythm Post-procedure rhythm: normal sinus rhythm Complications: no complications Patient tolerance: Patient tolerated the procedure well with no immediate complications  CRITICAL CARE Performed by: Nance Pear   Total critical care time: 30 minutes  Critical care time was exclusive of separately billable procedures and treating other  patients.  Critical care was necessary to treat or prevent imminent or life-threatening deterioration.  Critical care was time spent personally by me on the following activities: development of treatment plan with patient and/or surrogate as well as nursing, discussions with consultants, evaluation of patient's response to treatment, examination of patient, obtaining history from patient or surrogate, ordering and performing treatments and interventions, ordering and review of laboratory studies, ordering and review of radiographic studies, pulse oximetry and re-evaluation of patient's condition.  ____________________________________________   INITIAL IMPRESSION / ASSESSMENT AND PLAN / ED COURSE  Pertinent labs & imaging results that were available during my care of the patient were reviewed by me and considered in my medical decision making (see chart for details).  Patient presented to the emergency department today and found to be in a tachycardic rhythm.  Initial EKG was concerning for SVT so patient was initially given adenosine.  This slowed the rhythm down and exposed what appeared to be atrial flutter.  Given that the patient's blood pressures were low did not think medical management would be appropriate nor especially effective.  Did consent the patient for electric cardioversion.  The patient was already on Eliquis.  Patient tolerated the procedure well.  Blood pressure and rhythm both improved.  Patient was found to have elevated creatinine concerning for some dehydration acute kidney injury.  Given these findings patient will be admitted to the hospitalist service.  Discussed findings and plan with patient.  ____________________________________________   FINAL CLINICAL IMPRESSION(S) / ED DIAGNOSES  Final diagnoses:  Atrial flutter, unspecified type ([place])  AKI (acute kidney injury) ([place])     Note: This dictation was prepared with Dragon dictation. Any transcriptional errors  that result from this process are unintentional     Nance Pear, MD 10/30/17 1654

## 2017-10-29 NOTE — ED Notes (Addendum)
After procedure note - 1440 pt in st hr 150's. zoll pads placed on pt, pt placed on 4 LNC, dr Archie Balboa at bedside. Pt given 6mg  adenosine IV - see dr Gennette Pac note. Hr dipped to 90's and then returned to 150's. See flowsheet. Decision made to cardiovert pt. Emergency equipment brought into room. Dr Archie Balboa explained procedure to pt and daughters. [id number]  Dr Reather Littler RN, Myself in room. pt given ketamine for sedation and cardioverted with 150j sync. Pt now in SR, A/O. Breathing on own.

## 2017-10-30 ENCOUNTER — Inpatient Hospital Stay: Admit: 2017-10-30 | Payer: Medicare Other

## 2017-10-30 ENCOUNTER — Encounter: Payer: Self-pay | Admitting: *Deleted

## 2017-10-30 LAB — CBC
HCT: 29.9 % — ABNORMAL LOW (ref 35.0–47.0)
Hemoglobin: 10.3 g/dL — ABNORMAL LOW (ref 12.0–16.0)
MCH: 31.6 pg (ref 26.0–34.0)
MCHC: 34.5 g/dL (ref 32.0–36.0)
MCV: 91.6 fL (ref 80.0–100.0)
PLATELETS: 144 10*3/uL — AB (ref 150–440)
RBC: 3.27 MIL/uL — ABNORMAL LOW (ref 3.80–5.20)
RDW: 16 % — AB (ref 11.5–14.5)
WBC: 4.7 10*3/uL (ref 3.6–11.0)

## 2017-10-30 LAB — BASIC METABOLIC PANEL
ANION GAP: 11 (ref 5–15)
BUN: 48 mg/dL — ABNORMAL HIGH (ref 6–20)
CALCIUM: 8.5 mg/dL — AB (ref 8.9–10.3)
CHLORIDE: 100 mmol/L — AB (ref 101–111)
CO2: 21 mmol/L — ABNORMAL LOW (ref 22–32)
Creatinine, Ser: 1.43 mg/dL — ABNORMAL HIGH (ref 0.44–1.00)
GFR calc Af Amer: 36 mL/min — ABNORMAL LOW (ref >60)
GFR calc non Af Amer: 31 mL/min — ABNORMAL LOW (ref >60)
Glucose, Bld: 105 mg/dL — ABNORMAL HIGH (ref 65–99)
POTASSIUM: 3.6 mmol/L (ref 3.5–5.1)
SODIUM: 132 mmol/L — AB (ref 135–145)

## 2017-10-30 MED ORDER — DILTIAZEM HCL ER COATED BEADS 180 MG PO CP24: 180.0000 mg | ORAL_CAPSULE | Freq: Every day | ORAL | 1 refills | Status: DC

## 2017-10-30 MED ORDER — PREDNISONE 2.5 MG PO TABS
2.5000 mg | ORAL_TABLET | Freq: Every evening | ORAL | Status: DC
  Filled 2017-10-30: qty 1

## 2017-10-30 NOTE — Consult Note (Signed)
[facility] Consultation Note  Patient ID: Carol Bryan, MRN: [id number], DOB/AGE: 02-Nov-1923 83 y.o. Admit date: 10/29/2017   Date of Consult: 10/30/2017 Primary Physician: Adin Hector, MD Primary Cardiologist: None  Chief Complaint:  Chief Complaint  Patient presents with  . Palpitations   Reason for Consult: SVT  HPI: 83 y.o. female with known essential hypertension mixed hyperlipidemia and previous history of pulmonary embolism who has done fairly well with the appropriate therapy and had new onset of neck and upper chest discomfort and weakness and fatigue on Monday evening and into Tuesday to Wednesday she had continuous issues. The patient continued to have worsening shortness of breath and was seen in the emergency room with an EKG with supraventricular tachycardia with a short RP interval consistent with AV node reentrant tachycardia. With this the patient received adenosine and broke into sinus tachycardia and since has done fairly well. The patient had significant improvements of symptoms and now is feeling quite well with no evidence of worsening and she is and no evidence of myocardial infarction although has mild elevation of troponin most consistent with demand ischemia. His been no congestive heart failure or other valvular heart disease symptoms.  Past Medical History:  Diagnosis Date  . Collagen vascular disease (HCC)    RA  . DVT (deep venous thrombosis) ([place])   . HLD (hyperlipidemia)   . Hypertension   . Hypothyroid   . Pulmonary embolism ([place])   . RA (rheumatoid arthritis) ([place])   . Sjogren's disease [facility])       Surgical History:  Past Surgical History:  Procedure Laterality Date  . ABDOMINAL HYSTERECTOMY    . APPENDECTOMY    . ASD REPAIR  age 68  . cataracts    . JOINT REPLACEMENT Left    elbow  . PATENT DUCTUS ARTERIOUS REPAIR       Home Meds: Prior to Admission medications   Medication Sig Start Date End Date Taking? Authorizing  Provider  amitriptyline (ELAVIL) 10 MG tablet Take 20 mg by mouth daily. 07/31/16  Yes [provider]  apixaban (ELIQUIS) 5 MG TABS tablet Take 1 tablet (5 mg total) by mouth 2 (two) times daily. Patient taking differently: Take 2.5 mg 2 (two) times daily by mouth.  01/01/17  Yes Sainani, Belia Heman, MD  folic acid (FOLVITE) 1 MG tablet Take 3 mg by mouth daily.  01/30/16  Yes [provider]  furosemide (LASIX) 40 MG tablet Take 40 mg by mouth every other day. Alternates with 80 mg   Yes [provider]  hydroxychloroquine (PLAQUENIL) 200 MG tablet Take 200 mg by mouth daily.   Yes [provider]  levothyroxine (SYNTHROID, LEVOTHROID) 50 MCG tablet Take 50 mcg by mouth daily before breakfast.   Yes [provider]  losartan (COZAAR) 25 MG tablet Take 25 mg by mouth daily.   Yes [provider]  potassium chloride SA (K-DUR,KLOR-CON) 20 MEQ tablet Take 20 mEq by mouth daily.   Yes [provider]  predniSONE (DELTASONE) 5 MG tablet Take 5 mg by mouth daily with breakfast. 7.5mg -PM   Yes [provider]  Vitamin D, Ergocalciferol, (DRISDOL) 50000 units CAPS capsule Take 50,000 Units by mouth every 7 (seven) days. 10/03/16  Yes [provider]  azelastine (ASTELIN) 0.1 % nasal spray Place into the nose. 06/19/16   [provider]  diphenhydramine-acetaminophen (TYLENOL PM) 25-500 MG TABS tablet Take 1 tablet by mouth at bedtime as needed.  [provider]  methotrexate (RHEUMATREX) 2.5 MG tablet Take 2.5 mg by mouth once a week. Friday  Caution:Chemotherapy. Protect from light.    [provider]  prednisoLONE 5 MG TABS tablet Take 5 mg 2 (two) times daily by mouth.     [provider]    Inpatient Medications:  . amitriptyline  20 mg Oral QHS  . apixaban  2.5 mg Oral BID  . azelastine  1 spray Each Nare BID  . diltiazem  180 mg Oral Daily  . folic acid  3 mg Oral Daily  .  hydroxychloroquine  200 mg Oral Daily  . levothyroxine  50 mcg Oral QAC breakfast  . potassium chloride SA  20 mEq Oral Daily  . predniSONE  2.5 mg Oral QPM  . predniSONE  5 mg Oral Q breakfast   . sodium chloride 50 mL/hr at 10/29/17 1757    Allergies:  Allergies  Allergen Reactions  . Ciprofloxacin Other (See Comments)    Foot burning; tolerates levaquin  . Methotrexate Other (See Comments)    leukopenia  . Gold-Containing Drug Products Rash  . Sulfa Antibiotics Rash    Social History   Socioeconomic History  . Marital status: Married    Spouse name: Not on file  . Number of children: Not on file  . Years of education: Not on file  . Highest education level: Not on file  Social Needs  . Financial resource strain: Not on file  . Food insecurity - worry: Not on file  . Food insecurity - inability: Not on file  . Transportation needs - medical: Not on file  . Transportation needs - non-medical: Not on file  Occupational History  . Not on file  Tobacco Use  . Smoking status: Never Smoker  . Smokeless tobacco: Never Used  Substance and Sexual Activity  . Alcohol use: Yes    Comment: occasionally   . Drug use: No  . Sexual activity: Not on file  Other Topics Concern  . Not on file  Social History Narrative  . Not on file     Family History  Problem Relation Age of Onset  . Cancer Brother   . Stroke Mother   . Dementia Mother   . Kidney cancer Father   . Stroke Sister   . Cancer Sister   . Lung cancer Sister   . Liver cancer Sister      Review of Systems Positive for shortness of breath palpitations Negative for: General:  chills, fever, night sweats or weight changes.  Cardiovascular: PND orthopnea syncope dizziness  Dermatological skin lesions rashes Respiratory: Cough congestion Urologic: Frequent urination urination at night and hematuria Abdominal: negative for nausea, vomiting, diarrhea, bright red blood per rectum, melena, or  hematemesis Neurologic: negative for visual changes, and/or hearing changes  All other systems reviewed and are otherwise negative except as noted above.  Labs: Recent Labs    10/29/17 1456 10/29/17 1840 10/29/17 2309  TROPONINI 0.17* 0.14* 0.14*   Lab Results  Component Value Date   WBC 4.7 10/30/2017   HGB 10.3 (L) 10/30/2017   HCT 29.9 (L) 10/30/2017   MCV 91.6 10/30/2017   PLT 144 (L) 10/30/2017    Recent Labs  Lab 10/30/17 0354  NA 132*  K 3.6  CL 100*  CO2 21*  BUN 48*  CREATININE 1.43*  CALCIUM 8.5*  GLUCOSE 105*   No results found for: CHOL, HDL, LDLCALC, TRIG No results found for: DDIMER  Radiology/Studies:  Dg Chest Port 1 View  Result Date: 10/29/2017 CLINICAL DATA:  Set and chest discomfort and shortness of breath five days ago. History of previous pulmonary embolism, hypertension, nonsmoker. EXAM: PORTABLE CHEST 1 VIEW COMPARISON:  CT scan chest of December 24, 2016 FINDINGS: The lungs are adequately inflated. The interstitial markings are mildly prominent. There is no alveolar infiltrate or pleural effusion. There is mild thickening of the minor fissure. The cardiac silhouette is enlarged. The pulmonary vascularity is not engorged. There is calcification in the wall of the aortic arch. External pacemaker defibrillator pads are present. IMPRESSION: Mild cardiomegaly without pulmonary vascular congestion or pulmonary edema. Mild chronic bronchitic changes, stable. Thoracic aortic atherosclerosis. Electronically Signed   By: David  Martinique M.D.   On: 10/29/2017 16:34    EKG: Sinus tachycardia with left atrial enlargement  Weights: Filed Weights   10/29/17 1416  Weight: 72.6 kg (160 lb)     Physical Exam: Blood pressure 114/60, pulse 68, temperature 98 F (36.7 C), temperature source Oral, resp. rate 18, height 5\' 4"  (1.626 m), weight 72.6 kg (160 lb), SpO2 93 %. Body mass index is 27.46 kg/m. General: Well developed, well nourished, in no acute  distress. Head eyes ears nose throat: Normocephalic, atraumatic, sclera non-icteric, no xanthomas, nares are without discharge. No apparent thyromegaly and/or mass  Lungs: Normal respiratory effort.  no wheezes, no rales, no rhonchi.  Heart: RRR with normal S1 S2. no murmur gallop, no rub, PMI is normal size and placement, carotid upstroke normal without bruit, jugular venous pressure is normal Abdomen: Soft, non-tender, non-distended with normoactive bowel sounds. No hepatomegaly. No rebound/guarding. No obvious abdominal masses. Abdominal aorta is normal size without bruit Extremities: No edema. no cyanosis, no clubbing, no ulcers  Peripheral : 2+ bilateral upper extremity pulses, 2+ bilateral femoral pulses, 2+ bilateral dorsal pedal pulse Neuro: Alert and oriented. No facial asymmetry. No focal deficit. Moves all extremities spontaneously. Musculoskeletal: Normal muscle tone without kyphosis Psych:  Responds to questions appropriately with a normal affect.    Assessment: 83 year old female with shortness of breath palpitations with an EKG consistent with AV node reentry tachycardia now broken into normal sinus rhythm and feeling much better with minimal elevation of troponin consistent with demand ischemia  Plan: 1. No further cardiac intervention at this time due to full resolution of symptoms with change in rhythm 2. Continue diltiazem for suppression of AV node reentrant tachycardia 3. Ambulate patient following for any further significant symptoms and possible discharge to home with follow-up for further evaluation of structural heart disease and other concerns and adjustments of medication management  Signed, Corey Skains M.D. [facility] 10/30/2017, 8:30 AM

## 2017-10-30 NOTE — Discharge Summary (Signed)
[facility] NAME: Carol Bryan    MR#:  [id number]  DATE OF BIRTH:  07-02-1923  DATE OF ADMISSION:  10/29/2017   ADMITTING PHYSICIAN: Loletha Grayer, MD  DATE OF DISCHARGE: 10/30/2017  1:40 PM  PRIMARY CARE PHYSICIAN: [name], MD   ADMISSION DIAGNOSIS:  Shortness of breath [R06.02] AKI (acute kidney injury) ([place]) [N17.9] Atrial flutter, unspecified type ([place]) [I48.92] DISCHARGE DIAGNOSIS:  Active Problems:   SVT (supraventricular tachycardia) ([place])  SECONDARY DIAGNOSIS:   Past Medical History:  Diagnosis Date  . Collagen vascular disease (HCC)    RA  . DVT (deep venous thrombosis) ([place])   . HLD (hyperlipidemia)   . Hypertension   . Hypothyroid   . Pulmonary embolism ([place])   . RA (rheumatoid arthritis) ([place])   . Sjogren's disease ([place])    HOSPITAL COURSE:   1.  Supraventricular tachycardia and atrial flutter.  Patient was cardioverted in the emergency room.  Patient is already on Eliquis for pulmonary embolism. Continue diltiazem for suppression of AV node reentrant tachycardia per Dr. Nehemiah Massed.  2.  Acute kidney injury.  Gentle IV fluid hydration.  Baseline creatinine 1.3 and her creatinine today was 1.96 yesterday.  Improved with IV fluid support.  Hold Lasix and losartan.  3.  Diarrhea.    Resolved.  No bowel movement. 4.  Hypokalemia, improved with supplement. 5.  History of DVT and pulmonary embolism on Eliquis  6.  Essential hypertension.  Switch BP medications to Cardizem CD. Hold Lasix and losartan.  Follow-up as outpatient for adjustment.  7.  Hypothyroidism unspecified continue her usual medications and check a TSH Generalized weakness.  PT evaluation suggest home health and PT. Discussed with Dr. Nehemiah Massed. DISCHARGE CONDITIONS:  Stable, discharged to home with home health and PT today. CONSULTS OBTAINED:   DRUG ALLERGIES:   Allergies  Allergen Reactions  . Ciprofloxacin Other (See  Comments)    Foot burning; tolerates levaquin  . Methotrexate Other (See Comments)    leukopenia  . Gold-Containing Drug Products Rash  . Sulfa Antibiotics Rash   DISCHARGE MEDICATIONS:   Allergies as of 10/30/2017      Reactions   Ciprofloxacin Other (See Comments)   Foot burning; tolerates levaquin   Methotrexate Other (See Comments)   leukopenia   Gold-containing Drug Products Rash   Sulfa Antibiotics Rash      Medication List    STOP taking these medications   furosemide 40 MG tablet Commonly known as:  LASIX   losartan 25 MG tablet Commonly known as:  COZAAR     TAKE these medications   amitriptyline 10 MG tablet Commonly known as:  ELAVIL Take 20 mg by mouth daily.   apixaban 5 MG Tabs tablet Commonly known as:  ELIQUIS Take 1 tablet (5 mg total) by mouth 2 (two) times daily. What changed:  how much to take   azelastine 0.1 % nasal spray Commonly known as:  ASTELIN Place into the nose.   diltiazem 180 MG 24 hr capsule Commonly known as:  CARDIZEM CD Take 1 capsule (180 mg total) by mouth daily. Start taking on:  10/31/2017   diphenhydramine-acetaminophen 25-500 MG Tabs tablet Commonly known as:  TYLENOL PM Take 1 tablet by mouth at bedtime as needed.   folic acid 1 MG tablet Commonly known as:  FOLVITE Take 3 mg by mouth daily.   hydroxychloroquine 200 MG tablet Commonly known as:  PLAQUENIL Take 200 mg by  mouth daily.   levothyroxine 50 MCG tablet Commonly known as:  SYNTHROID, LEVOTHROID Take 50 mcg by mouth daily before breakfast.   methotrexate 2.5 MG tablet Commonly known as:  RHEUMATREX Take 2.5 mg by mouth once a week. Friday  Caution:Chemotherapy. Protect from light.   potassium chloride SA 20 MEQ tablet Commonly known as:  K-DUR,KLOR-CON Take 20 mEq by mouth daily.   prednisoLONE 5 MG Tabs tablet Take 5 mg 2 (two) times daily by mouth.   predniSONE 5 MG tablet Commonly known as:  DELTASONE Take 5 mg by mouth daily with  breakfast. 7.5mg -PM   Vitamin D (Ergocalciferol) 50000 units Caps capsule Commonly known as:  DRISDOL Take 50,000 Units by mouth every 7 (seven) days.            Durable Medical Equipment  (From admission, onward)        Start     Ordered   10/30/17 1135  For home use only DME Walker rolling  Once    Question:  Patient needs a walker to treat with the following condition  Answer:  Weakness generalized   10/30/17 1134       DISCHARGE INSTRUCTIONS:  See AVS.  If you experience worsening of your admission symptoms, develop shortness of breath, life threatening emergency, suicidal or homicidal thoughts you must seek medical attention immediately by calling 911 or calling your MD immediately  if symptoms less severe.  You Must read complete instructions/literature along with all the possible adverse reactions/side effects for all the Medicines you take and that have been prescribed to you. Take any new Medicines after you have completely understood and accpet all the possible adverse reactions/side effects.   Please note  You were cared for by a hospitalist during your hospital stay. If you have any questions about your discharge medications or the care you received while you were in the hospital after you are discharged, you can call the unit and asked to speak with the hospitalist on call if the hospitalist that took care of you is not available. Once you are discharged, your primary care physician will handle any further medical issues. Please note that NO REFILLS for any discharge medications will be authorized once you are discharged, as it is imperative that you return to your primary care physician (or establish a relationship with a primary care physician if you do not have one) for your aftercare needs so that they can reassess your need for medications and monitor your lab values.    On the day of Discharge:  VITAL SIGNS:  Blood pressure (!) 114/57, pulse 72, temperature  97.9 F (36.6 C), temperature source Oral, resp. rate 18, height 5\' 4"  (1.626 m), weight 160 lb (72.6 kg), SpO2 94 %. PHYSICAL EXAMINATION:  GENERAL:  83 y.o.-year-old patient lying in the bed with no acute distress.  EYES: Pupils equal, round, reactive to light and accommodation. No scleral icterus. Extraocular muscles intact.  HEENT: Head atraumatic, normocephalic. Oropharynx and nasopharynx clear.  NECK:  Supple, no jugular venous distention. No thyroid enlargement, no tenderness.  LUNGS: Normal breath sounds bilaterally, no wheezing, rales,rhonchi or crepitation. No use of accessory muscles of respiration.  CARDIOVASCULAR: S1, S2 normal. No murmurs, rubs, or gallops.  ABDOMEN: Soft, non-tender, non-distended. Bowel sounds present. No organomegaly or mass.  EXTREMITIES: No pedal edema, cyanosis, or clubbing.  NEUROLOGIC: Cranial nerves II through XII are intact. Muscle strength 4/5 in all extremities. Sensation intact. Gait not checked.  PSYCHIATRIC: The patient is alert  and oriented x 3.  SKIN: No obvious rash, lesion, or ulcer.  DATA REVIEW:   CBC Recent Labs  Lab 10/30/17 0354  WBC 4.7  HGB 10.3*  HCT 29.9*  PLT 144*    Chemistries  Recent Labs  Lab 10/29/17 1456 10/30/17 0354  NA 132* 132*  K 3.4* 3.6  CL 96* 100*  CO2 23 21*  GLUCOSE 124* 105*  BUN 51* 48*  CREATININE 1.96* 1.43*  CALCIUM 9.6 8.5*  MG 2.0  --      Microbiology Results  No results found for this or any previous visit.  RADIOLOGY:  Dg Chest Port 1 View  Result Date: 10/29/2017 CLINICAL DATA:  Set and chest discomfort and shortness of breath five days ago. History of previous pulmonary embolism, hypertension, nonsmoker. EXAM: PORTABLE CHEST 1 VIEW COMPARISON:  CT scan chest of December 24, 2016 FINDINGS: The lungs are adequately inflated. The interstitial markings are mildly prominent. There is no alveolar infiltrate or pleural effusion. There is mild thickening of the minor fissure. The cardiac  silhouette is enlarged. The pulmonary vascularity is not engorged. There is calcification in the wall of the aortic arch. External pacemaker defibrillator pads are present. IMPRESSION: Mild cardiomegaly without pulmonary vascular congestion or pulmonary edema. Mild chronic bronchitic changes, stable. Thoracic aortic atherosclerosis. Electronically Signed   By: David  Martinique M.D.   On: 10/29/2017 16:34     Management plans discussed with the patient, her daughter and they are in agreement.  CODE STATUS: Full Code   TOTAL TIME TAKING CARE OF THIS PATIENT: 36 minutes.    Demetrios Loll M.D on 10/30/2017 at 1:52 PM  Between 7am to 6pm - Pager - [phone]  After 6pm go to [email or web address] - password [email or web address]  [facility]  Office  [phone]  CC: Primary care physician; Adin Hector, MD   Note: This dictation was prepared with Dragon dictation along with smaller phrase technology. Any transcriptional errors that result from this process are unintentional.

## 2017-10-30 NOTE — Discharge Planning (Signed)
Discharge instructions reviewed with pt. Pt verbalizes understanding. Pt ready for discharge home.  All belongings gathered.

## 2017-10-30 NOTE — Care Management (Signed)
Informed patient is going to require home health SN and PT and a walker.  No agency preference. Walker provided by [facility].  Home Health SN and PT set up with [facility] as [facility] is unable to accept the referral and patient's plan is in network with [facility]

## 2017-10-30 NOTE — Discharge Instructions (Signed)
Heart healthy diet

## 2017-10-30 NOTE — Evaluation (Signed)
Physical Therapy Evaluation Patient Details Name: Carol Bryan MRN: [id number] DOB: 02-19-1923 Today's Date: 10/30/2017   History of Present Illness  Pt admitted for SVT. Pt with downtrending troponin at this time. Complaints of palpitations associated with N/V and fall at home due to weakness. History includes PE, RA, DVT, and HTN. Pt reports multiple falls at home  Clinical Impression  Pt is a pleasant 83 year old female who was admitted for SVT. HR now WNL and pt reports feeling much better. Pt performs bed mobility with supervision, transfers with cga, and ambulation with cga and RW. Pt demonstrates deficits with strength/mobility/balance. Educated pt to use RW for all mobility at this time to decrease falls risk. Performed 5time sit<>Stand in 23 seconds demonstrating slight decreased power/strength in B LE. Pt also slightly impulsive, educated to slow down during transitions as that tends to be where she loses balance and falls. Would benefit from skilled PT to address above deficits and promote optimal return to PLOF. Recommend transition to [place] upon discharge from acute hospitalization.       Follow Up Recommendations Home health PT    Equipment Recommendations  Rolling walker with 5" wheels    Recommendations for Other Services       Precautions / Restrictions Precautions Precautions: Fall      Mobility  Bed Mobility Overal bed mobility: Needs Assistance Bed Mobility: Supine to Sit     Supine to sit: Supervision     General bed mobility comments: safe technique performed. Slightly impulsive, needs cues for technique  Transfers Overall transfer level: Needs assistance Equipment used: Rolling walker (2 wheeled) Transfers: Sit to/from Stand Sit to Stand: Min guard         General transfer comment: upright posture noted, needs cues to keep RW close to body.  Ambulation/Gait Ambulation/Gait assistance: Min guard Ambulation Distance (Feet): 40 Feet Assistive  device: Rolling walker (2 wheeled) Gait Pattern/deviations: Step-through pattern     General Gait Details: slightly impulsive, needs cues for safe handling of RW. Slight unsteadiness noted  Stairs            Wheelchair Mobility    Modified Rankin (Stroke Patients Only)       Balance Overall balance assessment: Needs assistance;History of Falls Sitting-balance support: Feet supported Sitting balance-Leahy Scale: Good     Standing balance support: Bilateral upper extremity supported Standing balance-Leahy Scale: Good                               Pertinent Vitals/Pain Pain Assessment: No/denies pain    Home Living Family/patient expects to be discharged to:: Private residence Living Arrangements: Spouse/significant other Available Help at Discharge: Family Type of Home: House Home Access: Stairs to enter Entrance Stairs-Rails: Right Entrance Stairs-Number of Steps: 3 Home Layout: One level(has 2 steps down into living room with 1 rail) Home Equipment: None      Prior Function Level of Independence: Independent         Comments: active household ambulator, reports multiple falls recently     Hand Dominance        Extremity/Trunk Assessment   Upper Extremity Assessment Upper Extremity Assessment: Generalized weakness(B UE grossly 4/5)    Lower Extremity Assessment Lower Extremity Assessment: Generalized weakness(B LE grossly 4/5)       Communication   Communication: No difficulties  Cognition Arousal/Alertness: Awake/alert Behavior During Therapy: WFL for tasks assessed/performed Overall Cognitive Status: Within Functional Limits for  tasks assessed                                        General Comments      Exercises Other Exercises Other Exercises: Pt ambulated to bathroom with cga, able to perform self hygiene with mod I. Needs cues for safety   Assessment/Plan    PT Assessment Patient needs continued PT  services  PT Problem List Decreased strength;Decreased activity tolerance;Decreased balance;Decreased mobility       PT Treatment Interventions DME instruction;Gait training;Therapeutic exercise;Balance training    PT Goals (Current goals can be found in the Care Plan section)  Acute Rehab PT Goals Patient Stated Goal: to get stronger PT Goal Formulation: With patient Time For Goal Achievement: 11/13/17 Potential to Achieve Goals: Good    Frequency Min 2X/week   Barriers to discharge        Co-evaluation               AM-PAC PT "6 Clicks" Daily Activity  Outcome Measure Difficulty turning over in bed (including adjusting bedclothes, sheets and blankets)?: None Difficulty moving from lying on back to sitting on the side of the bed? : None Difficulty sitting down on and standing up from a chair with arms (e.g., wheelchair, bedside commode, etc,.)?: Unable Help needed moving to and from a bed to chair (including a wheelchair)?: A Little Help needed walking in hospital room?: A Little Help needed climbing 3-5 steps with a railing? : A Little 6 Click Score: 18    End of Session Equipment Utilized During Treatment: Gait belt Activity Tolerance: Patient tolerated treatment well Patient left: in chair;with chair alarm set Nurse Communication: Mobility status PT Visit Diagnosis: Unsteadiness on feet (R26.81);Muscle weakness (generalized) (M62.81);Difficulty in walking, not elsewhere classified (R26.2)    Time: 1017-1040 PT Time Calculation (min) (ACUTE ONLY): 23 min   Charges:   PT Evaluation $PT Eval Low Complexity: 1 Low PT Treatments $Therapeutic Activity: 8-22 mins   PT G Codes:   PT G-Codes **NOT FOR INPATIENT CLASS** Functional Assessment Tool Used: AM-PAC 6 Clicks Basic Mobility Functional Limitation: Mobility: Walking and moving around Mobility: Walking and Moving Around Current Status (S4967): At least 40 percent but less than 60 percent impaired, limited or  restricted Mobility: Walking and Moving Around Goal Status [phone]): At least 20 percent but less than 40 percent impaired, limited or restricted    Greggory Stallion, PT, DPT [phone]   Grayton Lobo 10/30/2017, 3:59 PM

## 2017-10-30 NOTE — Progress Notes (Signed)
Pt states she takes 2.5mg  prednisone at night and 5mg  in the morning, orders here are wrong. We have her getting 7.5mg  at night, MD paged, Dr. Jannifer Franklin gave the okay to change the PM dose back to her home dose.

## 2018-02-23 ENCOUNTER — Ambulatory Visit (INDEPENDENT_AMBULATORY_CARE_PROVIDER_SITE_OTHER): Payer: Medicare Other | Admitting: Podiatry

## 2018-02-23 ENCOUNTER — Encounter: Payer: Self-pay | Admitting: Podiatry

## 2018-02-23 DIAGNOSIS — L600 Ingrowing nail: Secondary | ICD-10-CM | POA: Diagnosis not present

## 2018-02-23 DIAGNOSIS — D689 Coagulation defect, unspecified: Secondary | ICD-10-CM

## 2018-02-23 DIAGNOSIS — M79675 Pain in left toe(s): Secondary | ICD-10-CM | POA: Diagnosis not present

## 2018-02-23 DIAGNOSIS — M79674 Pain in right toe(s): Secondary | ICD-10-CM | POA: Diagnosis not present

## 2018-02-23 DIAGNOSIS — B351 Tinea unguium: Secondary | ICD-10-CM

## 2018-02-23 NOTE — Progress Notes (Signed)
Complaint:  Visit Type: Patient presents  to my office for  preventative foot care services. Complaint: Patient states" my nails have grown long and thick and become painful to walk and wear shoes"  The patient presents for preventative foot care services. No changes to ROS.  Patient is taking eliquiss and her daughter recommended she be treated by a podiatrist.  Podiatric Exam: Vascular: dorsalis pedis and posterior tibial pulses are not  palpable bilateral due to swelling. Capillary return is immediate. Temperature gradient is WNL. Thin shiny skin noted. Sensorium: Normal Semmes Weinstein monofilament test. Normal tactile sensation bilaterally. Nail Exam: Pt has thick disfigured discolored nails with subungual debris noted bilateral entire nail hallux  Ulcer Exam: There is no evidence of ulcer or pre-ulcerative changes or infection. Orthopedic Exam: Muscle tone and strength are WNL. No limitations in general ROM. No crepitus or effusions noted. Foot type and digits show no abnormalities. Bony prominences are unremarkable. Skin: No Porokeratosis. No infection or ulcers  Diagnosis:  Onychomycosis, , Pain in right toe, pain in left toes  Treatment & Plan Procedures and Treatment: Consent by patient was obtained for treatment procedures.   Debridement of mycotic and hypertrophic toenails, 1 through 5 bilateral and clearing of subungual debris. No ulceration, no infection noted.  Return Visit-Office Procedure: Patient instructed to return to the office for a follow up visit 4 months for continued evaluation and treatment.    Gardiner Barefoot DPM

## 2018-04-05 NOTE — Progress Notes (Signed)
[facility]  [phone] [phone]  ID: Pilar Grammes OB: 05/13/1923  MR#: [id number]  WPY#:[id number]  Patient Care Team: Adin Hector, MD as PCP - General (Internal Medicine) Corey Skains, MD as Consulting Physician (Cardiology)  CHIEF COMPLAINT: Pancytopenia, PE/DVT.  INTERVAL HISTORY: Patient returns to clinic today for repeat laboratory work and routine six-month evaluation.  She has noticed increased weakness and fatigue over the past several months, but otherwise feels well.  She had some recent cardiac issues and describes what appears to be a cardioversion. She has no neurologic complaints. She denies any recent fevers or illnesses. She has a good appetite and denies weight loss. She denies any chest pain or shortness of breath. She has no cough or hemoptysis. She denies any nausea, vomiting, constipation, or diarrhea.  She has no melena or hematochezia.  She has no urinary complaints.  Patient offers no further specific complaints today.  REVIEW OF SYSTEMS:   Review of Systems  Constitutional: Negative.  Negative for fever, malaise/fatigue and weight loss.  Respiratory: Negative.  Negative for cough, hemoptysis and shortness of breath.   Cardiovascular: Negative.  Negative for chest pain and leg swelling.  Gastrointestinal: Negative.  Negative for abdominal pain, blood in stool and melena.  Genitourinary: Negative.  Negative for hematuria.  Musculoskeletal: Negative.   Skin: Negative.  Negative for rash.  Neurological: Negative.  Negative for sensory change, focal weakness and weakness.  Endo/Heme/Allergies: Does not bruise/bleed easily.  Psychiatric/Behavioral: Negative.  The patient is not nervous/anxious.     As per HPI. Otherwise, a complete review of systems is negative.  PAST MEDICAL HISTORY: Past Medical History:  Diagnosis Date  . Collagen vascular disease (HCC)    RA  . DVT (deep venous thrombosis) ([place])   . HLD  (hyperlipidemia)   . Hypertension   . Hypothyroid   . Pulmonary embolism ([place])   . RA (rheumatoid arthritis) ([place])   . Sjogren's disease ([place])     PAST SURGICAL HISTORY: Past Surgical History:  Procedure Laterality Date  . ABDOMINAL HYSTERECTOMY    . APPENDECTOMY    . ASD REPAIR  age 84  . cataracts    . JOINT REPLACEMENT Left    elbow  . PATENT DUCTUS ARTERIOUS REPAIR      FAMILY HISTORY: Family History  Problem Relation Age of Onset  . Cancer Brother   . Stroke Mother   . Dementia Mother   . Kidney cancer Father   . Stroke Sister   . Cancer Sister   . Lung cancer Sister   . Liver cancer Sister     ADVANCED DIRECTIVES (Y/N):  N  HEALTH MAINTENANCE: Social History   Tobacco Use  . Smoking status: Never Smoker  . Smokeless tobacco: Never Used  Substance Use Topics  . Alcohol use: Yes    Comment: occasionally   . Drug use: No     Colonoscopy:  PAP:  Bone density:  Lipid panel:  Allergies  Allergen Reactions  . Ciprofloxacin Other (See Comments)    Foot burning; tolerates levaquin  . Methotrexate Other (See Comments)    leukopenia  . Gold-Containing Drug Products Rash  . Other Other (See Comments) and Rash  . Sulfa Antibiotics Rash    Current Outpatient Medications  Medication Sig Dispense Refill  . amitriptyline (ELAVIL) 10 MG tablet TAKE 2 TABLETS NIGHTLY    . apixaban (ELIQUIS) 5 MG TABS tablet Take 5 mg by mouth 2 (two) times daily.     Marland Kitchen  Cyanocobalamin (VITAMIN B 12 PO) Take 1,000 mcg by mouth daily.    . diphenhydramine-acetaminophen (TYLENOL PM) 25-500 MG TABS tablet Take 1 tablet by mouth at bedtime as needed.    . folic acid (FOLVITE) 1 MG tablet Take 3 mg by mouth daily.     . hydroxychloroquine (PLAQUENIL) 200 MG tablet TAKE 1 TABLET DAILY    . levothyroxine (SYNTHROID, LEVOTHROID) 50 MCG tablet Take 50 mcg by mouth daily before breakfast.    . methotrexate (RHEUMATREX) 2.5 MG tablet Take 2.5 mg by mouth once a week.  Friday  Caution:Chemotherapy. Protect from light.    . metoprolol succinate (TOPROL-XL) 12.5 mg TB24 24 hr tablet Take 12.5 mg by mouth daily.    . potassium chloride SA (K-DUR,KLOR-CON) 20 MEQ tablet Take 20 mEq by mouth daily.    . prednisoLONE 5 MG TABS tablet Take 5 mg 2 (two) times daily by mouth.     . predniSONE (DELTASONE) 5 MG tablet Take by mouth.    Marland Kitchen azelastine (ASTELIN) 0.1 % nasal spray Place into the nose.    . diltiazem (CARDIZEM CD) 180 MG 24 hr capsule Take 1 capsule (180 mg total) by mouth daily. (Patient not taking: Reported on 02/23/2018) 30 capsule 1  . Vitamin D, Ergocalciferol, (DRISDOL) 50000 units CAPS capsule Take 1,000 Units by mouth every 7 (seven) days.      No current facility-administered medications for this visit.     OBJECTIVE: Vitals:   04/06/18 1408  BP: 112/74  Pulse: 90  Resp: 18  Temp: 97.8 F (36.6 C)  SpO2: 95%     Body mass index is 26.49 kg/m.    ECOG FS:0 - Asymptomatic  General: Well-developed, well-nourished, no acute distress. Eyes: Pink conjunctiva, anicteric sclera. Lungs: Clear to auscultation bilaterally. Heart: Regular rate and rhythm. No rubs, murmurs, or gallops. Abdomen: Soft, nontender, nondistended. No organomegaly noted, normoactive bowel sounds. Musculoskeletal: No edema, cyanosis, or clubbing. Neuro: Alert, answering all questions appropriately. Cranial nerves grossly intact. Skin: No rashes or petechiae noted. Psych: Normal affect.  LAB RESULTS:  Lab Results  Component Value Date   NA 132 (L) 10/30/2017   K 3.6 10/30/2017   CL 100 (L) 10/30/2017   CO2 21 (L) 10/30/2017   GLUCOSE 105 (H) 10/30/2017   BUN 48 (H) 10/30/2017   CREATININE 1.43 (H) 10/30/2017   CALCIUM 8.5 (L) 10/30/2017   PROT 6.5 06/20/2013   ALBUMIN 3.4 06/20/2013   AST 26 06/20/2013   ALT 20 06/20/2013   ALKPHOS 78 06/20/2013   BILITOT 0.6 06/20/2013   GFRNONAA 31 (L) 10/30/2017   GFRAA 36 (L) 10/30/2017    Lab Results  Component  Value Date   WBC 2.9 (L) 04/06/2018   NEUTROABS 2.0 04/06/2018   HGB 10.9 (L) 04/06/2018   HCT 32.5 (L) 04/06/2018   MCV 90.0 04/06/2018   PLT 138 (L) 04/06/2018     STUDIES: No results found.  ASSESSMENT: Pancytopenia, PE/DVT.  PLAN:    1. Pancytopenia: Patient's white blood cell count and platelet count have trended down.  She also continues to be mildly anemic.  Previously, the remainder of her blood work including iron stores, B-12, folate, reticulocyte count, and hemolysis labs are all within normal limits. Antineutrophil antibodies are positive, but likely clinically insignificant. No intervention is needed at this time. Patient does not require bone marrow biopsy, but would consider one in the future if her blood counts continue to trend down.  No intervention is needed at this  time.  Return to clinic in 3 months with repeat laboratory work and further evaluation. 2.  Anemia: Patient's hemoglobin has trended down slightly.  Previously all of her other laboratory work was either negative or within normal limits.  Will repeat iron stores in 3 months at her next lab and clinic visit. 3. Pulmonary embolism, left popliteal DVT: Patient continues to take Eliquis, but now it appears this is for her underlying cardiac issues.  Continue treatment and management per primary care.    Approximately 20 minutes was spent in discussion of which greater than 50% was consultation.   Patient expressed understanding and was in agreement with this plan. She also understands that She can call clinic at any time with any questions, concerns, or complaints.    Lloyd Huger, MD   04/06/2018 3:36 PM

## 2018-04-06 ENCOUNTER — Encounter: Payer: Self-pay | Admitting: Oncology

## 2018-04-06 ENCOUNTER — Inpatient Hospital Stay: Payer: Medicare Other | Attending: Oncology

## 2018-04-06 ENCOUNTER — Inpatient Hospital Stay (HOSPITAL_BASED_OUTPATIENT_CLINIC_OR_DEPARTMENT_OTHER): Payer: Medicare Other | Admitting: Oncology

## 2018-04-06 VITALS — BP 112/74 | HR 90 | Temp 97.8°F | Resp 18 | Ht 64.0 in | Wt 154.3 lb

## 2018-04-06 DIAGNOSIS — Z800 Family history of malignant neoplasm of digestive organs: Secondary | ICD-10-CM

## 2018-04-06 DIAGNOSIS — Z801 Family history of malignant neoplasm of trachea, bronchus and lung: Secondary | ICD-10-CM

## 2018-04-06 DIAGNOSIS — Z79899 Other long term (current) drug therapy: Secondary | ICD-10-CM | POA: Insufficient documentation

## 2018-04-06 DIAGNOSIS — Z86718 Personal history of other venous thrombosis and embolism: Secondary | ICD-10-CM

## 2018-04-06 DIAGNOSIS — E039 Hypothyroidism, unspecified: Secondary | ICD-10-CM | POA: Diagnosis not present

## 2018-04-06 DIAGNOSIS — R531 Weakness: Secondary | ICD-10-CM | POA: Insufficient documentation

## 2018-04-06 DIAGNOSIS — D649 Anemia, unspecified: Secondary | ICD-10-CM | POA: Diagnosis not present

## 2018-04-06 DIAGNOSIS — D61818 Other pancytopenia: Secondary | ICD-10-CM | POA: Diagnosis not present

## 2018-04-06 DIAGNOSIS — Z86711 Personal history of pulmonary embolism: Secondary | ICD-10-CM

## 2018-04-06 DIAGNOSIS — I2699 Other pulmonary embolism without acute cor pulmonale: Secondary | ICD-10-CM

## 2018-04-06 DIAGNOSIS — Z7901 Long term (current) use of anticoagulants: Secondary | ICD-10-CM | POA: Diagnosis not present

## 2018-04-06 DIAGNOSIS — Z8051 Family history of malignant neoplasm of kidney: Secondary | ICD-10-CM

## 2018-04-06 LAB — CBC WITH DIFFERENTIAL/PLATELET
BASOS ABS: 0 10*3/uL (ref 0–0.1)
Basophils Relative: 0 %
Eosinophils Absolute: 0 10*3/uL (ref 0–0.7)
Eosinophils Relative: 0 %
HEMATOCRIT: 32.5 % — AB (ref 35.0–47.0)
Hemoglobin: 10.9 g/dL — ABNORMAL LOW (ref 12.0–16.0)
Lymphocytes Relative: 18 %
Lymphs Abs: 0.5 10*3/uL — ABNORMAL LOW (ref 1.0–3.6)
MCH: 30.1 pg (ref 26.0–34.0)
MCHC: 33.5 g/dL (ref 32.0–36.0)
MCV: 90 fL (ref 80.0–100.0)
MONO ABS: 0.3 10*3/uL (ref 0.2–0.9)
Monocytes Relative: 12 %
Neutro Abs: 2 10*3/uL (ref 1.4–6.5)
Neutrophils Relative %: 70 %
Platelets: 138 10*3/uL — ABNORMAL LOW (ref 150–440)
RBC: 3.61 MIL/uL — AB (ref 3.80–5.20)
RDW: 16.6 % — AB (ref 11.5–14.5)
WBC: 2.9 10*3/uL — ABNORMAL LOW (ref 3.6–11.0)

## 2018-04-06 NOTE — Progress Notes (Signed)
No new changes noted todayt 

## 2018-06-07 ENCOUNTER — Other Ambulatory Visit: Payer: Self-pay

## 2018-06-07 DIAGNOSIS — Z86711 Personal history of pulmonary embolism: Secondary | ICD-10-CM

## 2018-06-07 DIAGNOSIS — E039 Hypothyroidism, unspecified: Secondary | ICD-10-CM | POA: Diagnosis not present

## 2018-06-07 DIAGNOSIS — I4892 Unspecified atrial flutter: Secondary | ICD-10-CM | POA: Diagnosis not present

## 2018-06-07 DIAGNOSIS — E785 Hyperlipidemia, unspecified: Secondary | ICD-10-CM | POA: Diagnosis present

## 2018-06-07 DIAGNOSIS — B9620 Unspecified Escherichia coli [E. coli] as the cause of diseases classified elsewhere: Secondary | ICD-10-CM | POA: Diagnosis present

## 2018-06-07 DIAGNOSIS — I4891 Unspecified atrial fibrillation: Secondary | ICD-10-CM | POA: Diagnosis present

## 2018-06-07 DIAGNOSIS — M3500 Sicca syndrome, unspecified: Secondary | ICD-10-CM | POA: Diagnosis present

## 2018-06-07 DIAGNOSIS — Z800 Family history of malignant neoplasm of digestive organs: Secondary | ICD-10-CM

## 2018-06-07 DIAGNOSIS — M069 Rheumatoid arthritis, unspecified: Secondary | ICD-10-CM | POA: Diagnosis present

## 2018-06-07 DIAGNOSIS — I129 Hypertensive chronic kidney disease with stage 1 through stage 4 chronic kidney disease, or unspecified chronic kidney disease: Secondary | ICD-10-CM | POA: Diagnosis not present

## 2018-06-07 DIAGNOSIS — Z9109 Other allergy status, other than to drugs and biological substances: Secondary | ICD-10-CM | POA: Diagnosis not present

## 2018-06-07 DIAGNOSIS — L03115 Cellulitis of right lower limb: Secondary | ICD-10-CM | POA: Diagnosis present

## 2018-06-07 DIAGNOSIS — Z86718 Personal history of other venous thrombosis and embolism: Secondary | ICD-10-CM | POA: Diagnosis not present

## 2018-06-07 DIAGNOSIS — N179 Acute kidney failure, unspecified: Secondary | ICD-10-CM | POA: Diagnosis not present

## 2018-06-07 DIAGNOSIS — Z79890 Hormone replacement therapy (postmenopausal): Secondary | ICD-10-CM

## 2018-06-07 DIAGNOSIS — Z96622 Presence of left artificial elbow joint: Secondary | ICD-10-CM | POA: Diagnosis present

## 2018-06-07 DIAGNOSIS — Z79899 Other long term (current) drug therapy: Secondary | ICD-10-CM

## 2018-06-07 DIAGNOSIS — N183 Chronic kidney disease, stage 3 (moderate): Secondary | ICD-10-CM | POA: Diagnosis not present

## 2018-06-07 DIAGNOSIS — Z91048 Other nonmedicinal substance allergy status: Secondary | ICD-10-CM | POA: Diagnosis not present

## 2018-06-07 DIAGNOSIS — Z7901 Long term (current) use of anticoagulants: Secondary | ICD-10-CM | POA: Diagnosis not present

## 2018-06-07 DIAGNOSIS — Z881 Allergy status to other antibiotic agents status: Secondary | ICD-10-CM

## 2018-06-07 DIAGNOSIS — Z888 Allergy status to other drugs, medicaments and biological substances status: Secondary | ICD-10-CM | POA: Diagnosis not present

## 2018-06-07 DIAGNOSIS — N390 Urinary tract infection, site not specified: Secondary | ICD-10-CM | POA: Diagnosis not present

## 2018-06-07 DIAGNOSIS — Z7952 Long term (current) use of systemic steroids: Secondary | ICD-10-CM

## 2018-06-07 DIAGNOSIS — Z90710 Acquired absence of both cervix and uterus: Secondary | ICD-10-CM

## 2018-06-07 DIAGNOSIS — Z8051 Family history of malignant neoplasm of kidney: Secondary | ICD-10-CM

## 2018-06-07 DIAGNOSIS — Z882 Allergy status to sulfonamides status: Secondary | ICD-10-CM

## 2018-06-07 DIAGNOSIS — Z801 Family history of malignant neoplasm of trachea, bronchus and lung: Secondary | ICD-10-CM

## 2018-06-07 LAB — BASIC METABOLIC PANEL
Anion gap: 11 (ref 5–15)
BUN: 30 mg/dL — ABNORMAL HIGH (ref 8–23)
CALCIUM: 8.4 mg/dL — AB (ref 8.9–10.3)
CO2: 23 mmol/L (ref 22–32)
CREATININE: 1.77 mg/dL — AB (ref 0.44–1.00)
Chloride: 101 mmol/L (ref 98–111)
GFR, EST AFRICAN AMERICAN: 28 mL/min — AB (ref >60)
GFR, EST NON AFRICAN AMERICAN: 24 mL/min — AB (ref >60)
GLUCOSE: 173 mg/dL — AB (ref 70–99)
Potassium: 4.1 mmol/L (ref 3.5–5.1)
Sodium: 135 mmol/L (ref 135–145)

## 2018-06-07 LAB — CBC
HCT: 34.2 % — ABNORMAL LOW (ref 35.0–47.0)
HEMOGLOBIN: 11.7 g/dL — AB (ref 12.0–16.0)
MCH: 30.3 pg (ref 26.0–34.0)
MCHC: 34.3 g/dL (ref 32.0–36.0)
MCV: 88.3 fL (ref 80.0–100.0)
PLATELETS: 127 10*3/uL — AB (ref 150–440)
RBC: 3.88 MIL/uL (ref 3.80–5.20)
RDW: 18.3 % — ABNORMAL HIGH (ref 11.5–14.5)
WBC: 6.7 10*3/uL (ref 3.6–11.0)

## 2018-06-07 NOTE — ED Triage Notes (Signed)
Family reports patient showing signs of UTI took urine specimen to doctor and received culture results today and positive for e coli.  Also reports swelling and redness to right lower leg.

## 2018-06-08 ENCOUNTER — Inpatient Hospital Stay
Admission: EM | Admit: 2018-06-08 | Discharge: 2018-06-10 | DRG: 683 | Disposition: A | Discharge status: Home Health | Payer: Medicare Other | Attending: Internal Medicine | Admitting: Internal Medicine

## 2018-06-08 ENCOUNTER — Emergency Department: Payer: Medicare Other

## 2018-06-08 DIAGNOSIS — Z86718 Personal history of other venous thrombosis and embolism: Secondary | ICD-10-CM | POA: Diagnosis not present

## 2018-06-08 DIAGNOSIS — N183 Chronic kidney disease, stage 3 (moderate): Secondary | ICD-10-CM | POA: Diagnosis not present

## 2018-06-08 DIAGNOSIS — N179 Acute kidney failure, unspecified: Secondary | ICD-10-CM | POA: Diagnosis not present

## 2018-06-08 DIAGNOSIS — Z79899 Other long term (current) drug therapy: Secondary | ICD-10-CM | POA: Diagnosis not present

## 2018-06-08 DIAGNOSIS — N189 Chronic kidney disease, unspecified: Secondary | ICD-10-CM

## 2018-06-08 DIAGNOSIS — N390 Urinary tract infection, site not specified: Secondary | ICD-10-CM | POA: Diagnosis not present

## 2018-06-08 DIAGNOSIS — Z7952 Long term (current) use of systemic steroids: Secondary | ICD-10-CM | POA: Diagnosis not present

## 2018-06-08 DIAGNOSIS — I4891 Unspecified atrial fibrillation: Secondary | ICD-10-CM | POA: Diagnosis not present

## 2018-06-08 DIAGNOSIS — L03115 Cellulitis of right lower limb: Secondary | ICD-10-CM

## 2018-06-08 DIAGNOSIS — M069 Rheumatoid arthritis, unspecified: Secondary | ICD-10-CM | POA: Diagnosis not present

## 2018-06-08 DIAGNOSIS — Z79890 Hormone replacement therapy (postmenopausal): Secondary | ICD-10-CM | POA: Diagnosis not present

## 2018-06-08 DIAGNOSIS — N3000 Acute cystitis without hematuria: Secondary | ICD-10-CM

## 2018-06-08 DIAGNOSIS — Z7901 Long term (current) use of anticoagulants: Secondary | ICD-10-CM | POA: Diagnosis not present

## 2018-06-08 DIAGNOSIS — B9620 Unspecified Escherichia coli [E. coli] as the cause of diseases classified elsewhere: Secondary | ICD-10-CM | POA: Diagnosis not present

## 2018-06-08 DIAGNOSIS — E785 Hyperlipidemia, unspecified: Secondary | ICD-10-CM | POA: Diagnosis not present

## 2018-06-08 DIAGNOSIS — I4892 Unspecified atrial flutter: Secondary | ICD-10-CM | POA: Diagnosis not present

## 2018-06-08 DIAGNOSIS — I129 Hypertensive chronic kidney disease with stage 1 through stage 4 chronic kidney disease, or unspecified chronic kidney disease: Secondary | ICD-10-CM | POA: Diagnosis not present

## 2018-06-08 DIAGNOSIS — M3500 Sicca syndrome, unspecified: Secondary | ICD-10-CM | POA: Diagnosis not present

## 2018-06-08 DIAGNOSIS — E039 Hypothyroidism, unspecified: Secondary | ICD-10-CM | POA: Diagnosis not present

## 2018-06-08 DIAGNOSIS — Z86711 Personal history of pulmonary embolism: Secondary | ICD-10-CM | POA: Diagnosis not present

## 2018-06-08 LAB — URINALYSIS, COMPLETE (UACMP) WITH MICROSCOPIC
Bilirubin Urine: NEGATIVE
GLUCOSE, UA: NEGATIVE mg/dL
Ketones, ur: NEGATIVE mg/dL
Leukocytes, UA: NEGATIVE
Nitrite: POSITIVE — AB
PH: 5 (ref 5.0–8.0)
Protein, ur: NEGATIVE mg/dL
Specific Gravity, Urine: 1.01 (ref 1.005–1.030)

## 2018-06-08 LAB — BLOOD CULTURE ID PANEL (REFLEXED)
ACINETOBACTER BAUMANNII: NOT DETECTED
CANDIDA ALBICANS: NOT DETECTED
CANDIDA KRUSEI: NOT DETECTED
CANDIDA PARAPSILOSIS: NOT DETECTED
CANDIDA TROPICALIS: NOT DETECTED
Candida glabrata: NOT DETECTED
ENTEROBACTERIACEAE SPECIES: NOT DETECTED
ESCHERICHIA COLI: NOT DETECTED
Enterobacter cloacae complex: NOT DETECTED
Enterococcus species: NOT DETECTED
HAEMOPHILUS INFLUENZAE: NOT DETECTED
KLEBSIELLA OXYTOCA: NOT DETECTED
KLEBSIELLA PNEUMONIAE: NOT DETECTED
Listeria monocytogenes: NOT DETECTED
Methicillin resistance: NOT DETECTED
Neisseria meningitidis: NOT DETECTED
PSEUDOMONAS AERUGINOSA: NOT DETECTED
Proteus species: NOT DETECTED
STREPTOCOCCUS PNEUMONIAE: NOT DETECTED
STREPTOCOCCUS PYOGENES: NOT DETECTED
Serratia marcescens: NOT DETECTED
Staphylococcus aureus (BCID): NOT DETECTED
Staphylococcus species: DETECTED — AB
Streptococcus agalactiae: NOT DETECTED
Streptococcus species: NOT DETECTED

## 2018-06-08 LAB — LACTIC ACID, PLASMA
LACTIC ACID, VENOUS: 2.3 mmol/L — AB (ref 0.5–1.9)
LACTIC ACID, VENOUS: 2 mmol/L — AB (ref 0.5–1.9)

## 2018-06-08 LAB — TSH: TSH: 8.692 u[IU]/mL — ABNORMAL HIGH (ref 0.350–4.500)

## 2018-06-08 MED ORDER — ONDANSETRON HCL 4 MG/2ML IJ SOLN: 4.0000 mg | Freq: Four times a day (QID) | INTRAMUSCULAR | Status: DC | PRN

## 2018-06-08 MED ORDER — ONDANSETRON HCL 4 MG PO TABS: 4.0000 mg | ORAL_TABLET | Freq: Four times a day (QID) | ORAL | Status: DC | PRN

## 2018-06-08 MED ORDER — PREDNISONE 10 MG PO TABS
5.0000 mg | ORAL_TABLET | Freq: Two times a day (BID) | ORAL | Status: DC
  Administered 2018-06-08 – 2018-06-10 (×5): 5 mg via ORAL
  Filled 2018-06-08 (×3): qty 1
  Filled 2018-06-08: qty 0.5
  Filled 2018-06-08 (×2): qty 1

## 2018-06-08 MED ORDER — PIPERACILLIN-TAZOBACTAM 3.375 G IVPB 30 MIN
3.3750 g | Freq: Once | INTRAVENOUS | Status: AC
  Administered 2018-06-08: 3.375 g via INTRAVENOUS
  Filled 2018-06-08: qty 50

## 2018-06-08 MED ORDER — ACETAMINOPHEN 500 MG PO TABS: 500.0000 mg | ORAL_TABLET | Freq: Every evening | ORAL | Status: DC | PRN

## 2018-06-08 MED ORDER — FUROSEMIDE 20 MG PO TABS
20.0000 mg | ORAL_TABLET | Freq: Every day | ORAL | Status: DC
  Administered 2018-06-08: 20 mg via ORAL
  Filled 2018-06-08: qty 1

## 2018-06-08 MED ORDER — LEVOTHYROXINE SODIUM 50 MCG PO TABS
50.0000 ug | ORAL_TABLET | Freq: Every day | ORAL | Status: DC
  Administered 2018-06-08 – 2018-06-10 (×3): 50 ug via ORAL
  Filled 2018-06-08 (×3): qty 1

## 2018-06-08 MED ORDER — DEXTROSE 50 % IV SOLN
INTRAVENOUS | Status: AC
  Filled 2018-06-08: qty 50

## 2018-06-08 MED ORDER — APIXABAN 2.5 MG PO TABS
2.5000 mg | ORAL_TABLET | Freq: Two times a day (BID) | ORAL | Status: DC
  Administered 2018-06-08 – 2018-06-10 (×5): 2.5 mg via ORAL
  Filled 2018-06-08 (×5): qty 1

## 2018-06-08 MED ORDER — AMITRIPTYLINE HCL 10 MG PO TABS
20.0000 mg | ORAL_TABLET | Freq: Every day | ORAL | Status: DC
  Administered 2018-06-08 – 2018-06-09 (×2): 20 mg via ORAL
  Filled 2018-06-08 (×3): qty 2

## 2018-06-08 MED ORDER — POLYVINYL ALCOHOL 1.4 % OP SOLN
1.0000 [drp] | OPHTHALMIC | Status: DC | PRN
  Filled 2018-06-08: qty 15

## 2018-06-08 MED ORDER — ACETAMINOPHEN 650 MG RE SUPP
650.0000 mg | Freq: Four times a day (QID) | RECTAL | Status: DC | PRN
Start: 2018-06-08 — End: 2018-06-10

## 2018-06-08 MED ORDER — METOPROLOL SUCCINATE ER 25 MG PO TB24
12.5000 mg | ORAL_TABLET | Freq: Every day | ORAL | Status: DC
  Administered 2018-06-08 – 2018-06-10 (×3): 12.5 mg via ORAL
  Filled 2018-06-08 (×3): qty 1

## 2018-06-08 MED ORDER — VANCOMYCIN HCL IN DEXTROSE 1-5 GM/200ML-% IV SOLN
1000.0000 mg | Freq: Once | INTRAVENOUS | Status: AC
  Administered 2018-06-08: 1000 mg via INTRAVENOUS
  Filled 2018-06-08: qty 200

## 2018-06-08 MED ORDER — DIPHENHYDRAMINE HCL 25 MG PO CAPS: 25.0000 mg | ORAL_CAPSULE | Freq: Every evening | ORAL | Status: DC | PRN

## 2018-06-08 MED ORDER — SODIUM CHLORIDE 0.9 % IV SOLN
3.0000 g | Freq: Two times a day (BID) | INTRAVENOUS | Status: DC
  Administered 2018-06-08 (×2): 3 g via INTRAVENOUS
  Filled 2018-06-08 (×4): qty 3

## 2018-06-08 MED ORDER — DOCUSATE SODIUM 100 MG PO CAPS
100.0000 mg | ORAL_CAPSULE | Freq: Two times a day (BID) | ORAL | Status: DC
  Administered 2018-06-08 – 2018-06-10 (×2): 100 mg via ORAL
  Filled 2018-06-08 (×3): qty 1

## 2018-06-08 MED ORDER — SODIUM CHLORIDE 0.9 % IV BOLUS
1000.0000 mL | Freq: Once | INTRAVENOUS | Status: AC
  Administered 2018-06-08: 1000 mL via INTRAVENOUS

## 2018-06-08 MED ORDER — VITAMIN D (ERGOCALCIFEROL) 1.25 MG (50000 UNIT) PO CAPS
50000.0000 [IU] | ORAL_CAPSULE | ORAL | Status: DC
  Administered 2018-06-09: 50000 [IU] via ORAL
  Filled 2018-06-08: qty 1

## 2018-06-08 MED ORDER — DIPHENHYDRAMINE-APAP (SLEEP) 25-500 MG PO TABS: 1.0000 | ORAL_TABLET | Freq: Every evening | ORAL | Status: DC | PRN

## 2018-06-08 MED ORDER — POTASSIUM CHLORIDE CRYS ER 20 MEQ PO TBCR
20.0000 meq | EXTENDED_RELEASE_TABLET | Freq: Every day | ORAL | Status: DC
  Administered 2018-06-08 – 2018-06-10 (×3): 20 meq via ORAL
  Filled 2018-06-08 (×3): qty 1

## 2018-06-08 MED ORDER — FOLIC ACID 1 MG PO TABS
3.0000 mg | ORAL_TABLET | Freq: Every day | ORAL | Status: DC
  Administered 2018-06-08 – 2018-06-10 (×3): 3 mg via ORAL
  Filled 2018-06-08 (×3): qty 3

## 2018-06-08 MED ORDER — HYDROXYCHLOROQUINE SULFATE 200 MG PO TABS
200.0000 mg | ORAL_TABLET | Freq: Every day | ORAL | Status: DC
  Administered 2018-06-08 – 2018-06-10 (×3): 200 mg via ORAL
  Filled 2018-06-08 (×3): qty 1

## 2018-06-08 MED ORDER — ACETAMINOPHEN 325 MG PO TABS
650.0000 mg | ORAL_TABLET | Freq: Four times a day (QID) | ORAL | Status: DC | PRN
  Administered 2018-06-08: 650 mg via ORAL
  Filled 2018-06-08: qty 2

## 2018-06-08 MED ORDER — SODIUM CHLORIDE 0.9 % IV SOLN
INTRAVENOUS | Status: DC
  Administered 2018-06-08 (×2): via INTRAVENOUS

## 2018-06-08 NOTE — Progress Notes (Signed)
Pt reported that she has not voided since she come in yesterday at around 2200. Bladder scan done. #4mls obtained. MD notified. No new order at this time.

## 2018-06-08 NOTE — Consult Note (Signed)
Pharmacy Antibiotic Note  Carol Bryan is a 84 y.o. female admitted on 06/08/2018 with cellulitis and UTI.  Pharmacy has been consulted for Unasyn dosing. Patient was receiving amoxicillin 875 BID PTA for UTI. Patient receiving Vancomycin 1g IV and Zosyn 3.375 IV x 1 dose in ED.Marland Kitchen  Plan: Will start Unasyn 3g IV every 12 hours based on current CrCl <8ml/min.   Height: 5' 2.5" (158.8 cm) Weight: 154 lb (69.9 kg) IBW/kg (Calculated) : 51.25  Temp (24hrs), Avg:98.4 F (36.9 C), Min:97.6 F (36.4 C), Max:99.2 F (37.3 C)  Recent Labs  Lab 06/07/18 2326 06/08/18 0152  WBC 6.7  --   CREATININE 1.77*  --   LATICACIDVEN 2.3* 2.0*    Estimated Creatinine Clearance: 18.8 mL/min (A) (by C-G formula based on SCr of 1.77 mg/dL (H)).    Allergies  Allergen Reactions  . Ciprofloxacin Other (See Comments)    Foot burning; tolerates levaquin  . Methotrexate Other (See Comments)    leukopenia  . Gold-Containing Drug Products Rash  . Other Other (See Comments) and Rash  . Sulfa Antibiotics Rash    Antimicrobials this admission: 7/15 Vancomycin/Zosyn >> x1 dose  7/15 Unasyn >>    Microbiology results: 7/15 BCx: pending 7/11 UCx: E. Coli    Thank you for allowing pharmacy to be a part of this patient's care.  Pernell Dupre, PharmD, BCPS Clinical Pharmacist 06/08/2018 8:47 AM

## 2018-06-08 NOTE — Progress Notes (Addendum)
Advanced Care Plan.  Purpose of Encounter: CODE STATUS. Parties in Attendance: The patient, her daughters, me. Patient's Decisional Capacity: Yes. Medical Story: This is a 84 year old female with history of DVT, PE, hypertension, hyperlipidemia and Sjogren's disease is admitted for right leg cellulitis, UTI and acute on chronic kidney injury.  I discussed with the patient about her current condition, prognosis and CODE STATUS.  She hesitated to make decision for full code or DNR.  I will put full code for now until the patient make a decision.  Plan:  Code Status: Full code for now. Time spent discussing advance care planning: 17 minutes.

## 2018-06-08 NOTE — Progress Notes (Signed)
Patient complains of right leg swelling and tenderness. A/P: This is a 84 year old female admitted for acute on chronic kidney injury. 1.  Acute on chronic kidney injury:  Continue intravenous fluid.  Avoid nephrotoxic agents. 2.  Cellulitis of right leg.  Unasyn IV.  3.  UTI. She received vancomycin and Zosyn.  Changed to Unasyn. 4.  Hypothyroidism: Continue Synthroid. 5.  Rheumatoid arthritis/Sjogren's: Continue Plaquenil and prednisone.  Also continue LiquiTears 6.  Atrial fib/flutter: Continue Eliquis and metoprolol.  I discussed with the patient, her daughters, Therapist, sports.  Time spent about 26 minutes.

## 2018-06-08 NOTE — Evaluation (Signed)
Physical Therapy Evaluation Patient Details Name: Carol Bryan MRN: [id number] DOB: [birth year] Today's Date: 06/08/2018   History of Present Illness  Pt is a 84 y/o F who was admitted for acute on chronic kidney injury, cellulitis of RLE, and UTI.  Pt's PMH includes RA, PE.    Clinical Impression  Pt admitted with above diagnosis. Pt currently with functional limitations due to the deficits listed below (see PT Problem List). Carol Bryan is independent at baseline, using no AD in home to ambulate but uses SPC outside of home.  No falls in the past 6 months but several near falls.  Pt's daughters assist with the heavy cleaning and grocery shopping.  Pt currently is able to ambulate 180 ft without AD but does demonstrate mild instability with higher level activities while ambulating as documented below.  Pt scored a 42/56 on the [employer], indicating she is at a greater risk of falling.  Thus, recommending HHPT to address balance impairments. Pt will benefit from skilled PT to increase their independence and safety with mobility to allow discharge to the venue listed below.      Follow Up Recommendations Home health PT(to address balance impairments)    Equipment Recommendations  None recommended by PT    Recommendations for Other Services       Precautions / Restrictions Precautions Precautions: Fall Restrictions Weight Bearing Restrictions: No      Mobility  Bed Mobility               General bed mobility comments: Pt sitting in chair at start and end of session  Transfers Overall transfer level: Needs assistance Equipment used: None Transfers: Sit to/from Stand Sit to Stand: Supervision         General transfer comment: Supervision for safety as pt demonstrates difficulty with boosting to stand due to weakness in BLEs/BUEs.  Pt educated on tips to improve ease with this (scooting to edge of seat, pushing up with BUEs).   Ambulation/Gait Ambulation/Gait  assistance: Min guard Gait Distance (Feet): 180 Feet Assistive device: None Gait Pattern/deviations: Step-through pattern;Decreased stride length     General Gait Details: Pt able to ambulate without signs of instability on level surface without challenges to her balance.  However, did demonstrate mild instability with higher level balance activities as documented below.   Stairs            Wheelchair Mobility    Modified Rankin (Stroke Patients Only)       Balance Overall balance assessment: Needs assistance Sitting-balance support: No upper extremity supported;Feet supported Sitting balance-Leahy Scale: Good     Standing balance support: No upper extremity supported;During functional activity Standing balance-Leahy Scale: Fair Standing balance comment: Pt able to stand statically without UE support but would likely lose her balance with perturbation.                  Standardized Balance Assessment Standardized Balance Assessment : Berg Balance Test;Dynamic Gait Index Berg Balance Test Sit to Stand: Able to stand  independently using hands Standing Unsupported: Able to stand safely 2 minutes Sitting with Back Unsupported but Feet Supported on Floor or Stool: Able to sit safely and securely 2 minutes Stand to Sit: Controls descent by using hands Transfers: Able to transfer safely, definite need of hands Standing Unsupported with Eyes Closed: Able to stand 10 seconds safely Standing Ubsupported with Feet Together: Able to place feet together independently and stand 1 minute safely From Standing, Reach Forward with  Outstretched Arm: Can reach forward >12 cm safely (5") From Standing Position, Pick up Object from Floor: Able to pick up shoe, needs supervision From Standing Position, Turn to Look Behind Over each Shoulder: Looks behind from both sides and weight shifts well Turn 360 Degrees: Able to turn 360 degrees safely one side only in 4 seconds or less Standing  Unsupported, Alternately Place Feet on Step/Stool: Able to stand independently and safely and complete 8 steps in 20 seconds Standing Unsupported, One Foot in Front: Loses balance while stepping or standing Standing on One Leg: Unable to try or needs assist to prevent fall Total Score: 42 Dynamic Gait Index Level Surface: Normal Change in Gait Speed: Mild Impairment Gait with Horizontal Head Turns: Mild Impairment Gait with Vertical Head Turns: Mild Impairment Gait and Pivot Turn: Normal Step Over Obstacle: Mild Impairment Step Around Obstacles: Normal       Pertinent Vitals/Pain Pain Assessment: Faces Faces Pain Scale: Hurts little more Pain Location: RLE Pain Descriptors / Indicators: Discomfort;Guarding Pain Intervention(s): Limited activity within patient's tolerance;Monitored during session    Home Living Family/patient expects to be discharged to:: Private residence Living Arrangements: Alone Available Help at Discharge: Family;Available PRN/intermittently Type of Home: House Home Access: Stairs to enter Entrance Stairs-Rails: Right Entrance Stairs-Number of Steps: 3 Home Layout: One level(has 2 steps down to living room with rail on the R ascending) Home Equipment: Cane - single point;Grab bars - tub/shower      Prior Function Level of Independence: Independent with assistive device(s)         Comments: Pt ambulates without AD in home but does use SPC outside of home.  Pt denies any recent falls but daughter reports pt has had several near falls.  Pt able to do her cooking, light cleaning. Daughters do the heavy cleaning and drive the pt to the grocery store. Pt does drive to hair appointments.      Hand Dominance        Extremity/Trunk Assessment   Upper Extremity Assessment Upper Extremity Assessment: RUE deficits/detail;LUE deficits/detail RUE Deficits / Details: Strength grossly 3-/5 LUE Deficits / Details: Pt with h/o dislocated L elbow replacement, no  plans for surgical repair.  Formal MMT limited due to this but strength grossly 2+/5    Lower Extremity Assessment Lower Extremity Assessment: RLE deficits/detail;LLE deficits/detail RLE Deficits / Details: Cellulities RLE.  Did not formally assess knee or ankle strength due to pain RLE but strength at least 3/5.  R hip strength 3-/5.  RLE: Unable to fully assess due to pain LLE Deficits / Details: L hip F 3-/5, otherwise strength grossly 4/5       Communication   Communication: No difficulties  Cognition Arousal/Alertness: Awake/alert Behavior During Therapy: WFL for tasks assessed/performed Overall Cognitive Status: Within Functional Limits for tasks assessed                                        General Comments General comments (skin integrity, edema, etc.): Daughter present during evaluation.     Exercises     Assessment/Plan    PT Assessment Patient needs continued PT services  PT Problem List Decreased strength;Decreased balance;Decreased safety awareness;Pain       PT Treatment Interventions DME instruction;Gait training;Stair training;Functional mobility training;Therapeutic activities;Therapeutic exercise;Balance training;Neuromuscular re-education;Patient/family education    PT Goals (Current goals can be found in the Care Plan section)  Acute Rehab  PT Goals Patient Stated Goal: to return home PT Goal Formulation: With patient Time For Goal Achievement: 06/22/18 Potential to Achieve Goals: Good    Frequency Min 2X/week   Barriers to discharge        Co-evaluation               AM-PAC PT "6 Clicks" Daily Activity  Outcome Measure Difficulty turning over in bed (including adjusting bedclothes, sheets and blankets)?: None Difficulty moving from lying on back to sitting on the side of the bed? : A Little Difficulty sitting down on and standing up from a chair with arms (e.g., wheelchair, bedside commode, etc,.)?: A Little Help needed  moving to and from a bed to chair (including a wheelchair)?: A Little Help needed walking in hospital room?: A Little Help needed climbing 3-5 steps with a railing? : A Little 6 Click Score: 19    End of Session Equipment Utilized During Treatment: Gait belt Activity Tolerance: Patient tolerated treatment well Patient left: in chair;with call bell/phone within reach;with chair alarm set;with family/visitor present Nurse Communication: Mobility status PT Visit Diagnosis: Muscle weakness (generalized) (M62.81);Unsteadiness on feet (R26.81)    Time: 8675-4492 PT Time Calculation (min) (ACUTE ONLY): 29 min   Charges:   PT Evaluation $PT Eval Low Complexity: 1 Low PT Treatments $Gait Training: 8-22 mins   PT G Codes:        Collie Siad PT, DPT 06/08/2018, 4:53 PM

## 2018-06-08 NOTE — Consult Note (Signed)
[facility] Nurse wound consult note Reason for Consult: leg laceration  Wound type:trauma; patient's daughter reports patient bumped it at home several days prior to the onset of the cellulitis Pressure Injury POA: NA Measurement:1cm x 1cm x 0 Wound EBX:IDHWYSH v shaped laceration Drainage (amount, consistency, odor) none Periwound: purple/red erythema and edema.  Edema is normal for this patient however the right leg is worse at this time Dressing procedure/placement/frequency: No topical care at this time.  Her skin is very thin and fragile. The small area has scabbed at this time.   Resume compression hose when cellulitis resolved.  Discussed POC with patient and bedside nurse.  Re consult if needed, will not follow at this time. Thanks  Ramiz Turpin [employer], RN,CWOCN, CNS, [place] [phone])

## 2018-06-08 NOTE — Plan of Care (Signed)
  Problem: Education: Goal: Knowledge of General Education information will improve Outcome: Progressing   Problem: Safety: Goal: Ability to remain free from injury will improve Outcome: Progressing   Problem: Skin Integrity: Goal: Risk for impaired skin integrity will decrease Outcome: Progressing   Problem: Clinical Measurements: Goal: Ability to avoid or minimize complications of infection will improve Outcome: Progressing   Problem: Skin Integrity: Goal: Skin integrity will improve Outcome: Progressing

## 2018-06-08 NOTE — ED Provider Notes (Signed)
[facility] Emergency Department Provider Note   ____________________________________________   First MD Initiated Contact with Patient 06/08/18 0014     (approximate)  I have reviewed the triage vital signs and the nursing notes.   HISTORY  Chief Complaint Urinary Frequency    HPI Carol Bryan is a 84 y.o. female who comes into the hospital today because she could not get up out of her chair and she has a kidney infection.  The patient states that she has a history of frequent kidney infections.  She went to her doctor on Friday and gave a sample.  The patient's family received an email tonight saying that the culture returned that was positive for E. coli.  The patient is also had a red and swollen right lower extremity.  The patient has a history of a DVT and PE to the family was concerned about recurrence.  She started complaining of urinary symptoms on Thursday.  She reports that she woke up at night with chills.  She denies any pain with urination and frequency.  She was not put on the antibiotics by her primary care physician.  She denies any abdominal pain, chest pain, shortness of breath.  She is here today for evaluation.   Past Medical History:  Diagnosis Date  . Collagen vascular disease (HCC)    RA  . DVT (deep venous thrombosis) ([place])   . HLD (hyperlipidemia)   . Hypertension   . Hypothyroid   . Pulmonary embolism ([place])   . RA (rheumatoid arthritis) ([place])   . Sjogren's disease [facility]     Patient Active Problem List   Diagnosis Date Noted  . SVT (supraventricular tachycardia) ([place]) 10/29/2017  . Other pancytopenia ([place]) 09/24/2017  . Arthropathy 08/20/2017  . Diverticulosis 08/20/2017  . Edema 08/20/2017  . Heart murmur 08/20/2017  . Osteoporosis, postmenopausal 08/20/2017  . Pleural thickening 08/20/2017  . Rheumatoid arthritis involving multiple sites with positive rheumatoid factor ([place]) 08/20/2017  . Sjogren's syndrome ([place])  08/20/2017  . Vitamin D deficiency, unspecified 08/20/2017  . Acute deep vein thrombosis (DVT) of popliteal vein of left lower extremity ([place]) 01/14/2017  . Pulmonary embolism ([place]) 12/24/2016  . Essential hypertension 12/24/2016  . Hyperlipidemia, unspecified 12/24/2016  . Hypothyroidism 12/24/2016  . Rheumatoid arteritis 12/24/2016  . History of pulmonary embolism 12/24/2016  . Senile purpura ([place]) 08/19/2016  . Aortic atherosclerosis ([place]) 08/08/2016  . B12 deficiency 07/23/2016  . Hypokalemia 07/23/2016  . Neutropenia ([place]) 01/23/2016  . Abnormal positron emission tomography (PET) scan 11/01/2015  . Incidental lung nodule 07/27/2015  . CKD (chronic kidney disease) stage 3, GFR 30-59 ml/min (HCC) 07/19/2015  . Splenic artery aneurysm ([place]) 07/19/2015  . Osteoarthritis 07/20/2014  . Hypothyroidism due to acquired atrophy of thyroid 03/06/2012  . Peptic ulcer disease 03/06/2012  . S/P PDA repair 03/06/2012  . Encounter for long-term (current) use of medications 10/29/2011  . Carpal tunnel syndrome of right wrist 10/28/2011  . Cervical spondylosis 10/28/2011    Past Surgical History:  Procedure Laterality Date  . ABDOMINAL HYSTERECTOMY    . APPENDECTOMY    . ASD REPAIR  age 60  . cataracts    . JOINT REPLACEMENT Left    elbow  . PATENT DUCTUS ARTERIOUS REPAIR      Prior to Admission medications   Medication Sig Start Date End Date Taking? Authorizing Provider  amitriptyline (ELAVIL) 10 MG tablet Take 20 mg by mouth at bedtime.   Yes [provider]  apixaban (ELIQUIS) 2.5 MG TABS tablet Take 2.5 mg by mouth 2 (two) times daily.   Yes [provider]  Cyanocobalamin (VITAMIN B 12 PO) Take 1,000 mcg by mouth daily.   Yes [provider]  diphenhydramine-acetaminophen (TYLENOL PM) 25-500 MG TABS tablet Take 1 tablet by mouth at bedtime as needed.   Yes [provider]  folic acid (FOLVITE) 1 MG tablet Take 3 mg by mouth daily.  01/30/16  Yes  [provider]  furosemide (LASIX) 20 MG tablet Take 20 mg by mouth daily.   Yes [provider]  hydroxychloroquine (PLAQUENIL) 200 MG tablet TAKE 1 TABLET DAILY 12/05/17  Yes [provider]  levothyroxine (SYNTHROID, LEVOTHROID) 50 MCG tablet Take 50 mcg by mouth daily before breakfast.   Yes [provider]  metoprolol succinate (TOPROL-XL) 12.5 mg TB24 24 hr tablet Take 12.5 mg by mouth daily.   Yes [provider]  polyvinyl alcohol (LIQUIFILM TEARS) 1.4 % ophthalmic solution Place 1 drop into both eyes as needed for dry eyes.   Yes [provider]  potassium chloride SA (K-DUR,KLOR-CON) 20 MEQ tablet Take 20 mEq by mouth daily.   Yes [provider]  predniSONE (DELTASONE) 5 MG tablet Take 5 mg by mouth 2 (two) times daily with a meal.    Yes [provider]  Vitamin D, Ergocalciferol, (DRISDOL) 50000 units CAPS capsule Take 1,000 Units by mouth every 7 (seven) days.  10/03/16  Yes [provider]  diltiazem (CARDIZEM CD) 180 MG 24 hr capsule Take 1 capsule (180 mg total) by mouth daily. Patient not taking: Reported on 02/23/2018 10/31/17   Demetrios Loll, MD    Allergies Ciprofloxacin; Methotrexate; Gold-containing drug products; Other; and Sulfa antibiotics  Family History  Problem Relation Age of Onset  . Cancer Brother   . Stroke Mother   . Dementia Mother   . Kidney cancer Father   . Stroke Sister   . Cancer Sister   . Lung cancer Sister   . Liver cancer Sister     Social History Social History   Tobacco Use  . Smoking status: Never Smoker  . Smokeless tobacco: Never Used  Substance Use Topics  . Alcohol use: Yes    Comment: occasionally   . Drug use: No    Review of Systems  Constitutional: chills Eyes: No visual changes. ENT: No sore throat. Cardiovascular: Denies chest pain. Respiratory: Denies shortness of breath. Gastrointestinal: No abdominal pain.  No nausea, no vomiting.  No  diarrhea.  No constipation. Genitourinary: Negative for dysuria. Musculoskeletal: Negative for back pain. Skin: Redness to right lower extremity Neurological: Negative for headaches, focal weakness or numbness.   ____________________________________________   PHYSICAL EXAM:  VITAL SIGNS: ED Triage Vitals  Enc Vitals Group     BP 06/07/18 2316 99/64     Pulse Rate 06/07/18 2316 (!) 116     Resp 06/07/18 2316 16     Temp 06/07/18 2316 99.2 F (37.3 C)     Temp src --      SpO2 06/07/18 2316 93 %     Weight 06/07/18 2317 154 lb (69.9 kg)     Height 06/07/18 2317 5' 2.5" (1.588 m)     Head Circumference --      Peak Flow --      Pain Score 06/07/18 2320 6     Pain Loc --      Pain Edu? --      Excl. in [place]? --  Constitutional: Alert and oriented. Well appearing and in mild distress. Eyes: Conjunctivae are normal. PERRL. EOMI. Head: Atraumatic. Nose: No congestion/rhinnorhea. Mouth/Throat: Mucous membranes are moist.  Oropharynx non-erythematous. Cardiovascular: Normal rate, regular rhythm. Grossly normal heart sounds.  Good peripheral circulation. Respiratory: Normal respiratory effort.  No retractions. Lungs CTAB. Gastrointestinal: Soft and nontender. No distention. Positive bowel sounds Musculoskeletal: No lower extremity tenderness nor edema.   Neurologic:  Normal speech and language.  Skin:  Skin is warm, dry and intact.  Erythema to right lower extremity that is hot with some purpura noted. Psychiatric: Mood and affect are normal.   ____________________________________________   LABS (all labs ordered are listed, but only abnormal results are displayed)  Labs Reviewed  BASIC METABOLIC PANEL - Abnormal; Notable for the following components:      Result Value   Glucose, Bld 173 (*)    BUN 30 (*)    Creatinine, Ser 1.77 (*)    Calcium 8.4 (*)    GFR calc non Af Amer 24 (*)    GFR calc Af Amer 28 (*)    All other components within normal limits  CBC -  Abnormal; Notable for the following components:   Hemoglobin 11.7 (*)    HCT 34.2 (*)    RDW 18.3 (*)    Platelets 127 (*)    All other components within normal limits  LACTIC ACID, PLASMA - Abnormal; Notable for the following components:   Lactic Acid, Venous 2.3 (*)    All other components within normal limits  CULTURE, BLOOD (ROUTINE X 2)  CULTURE, BLOOD (ROUTINE X 2)  URINALYSIS, COMPLETE (UACMP) WITH MICROSCOPIC  LACTIC ACID, PLASMA   ____________________________________________  EKG  none ____________________________________________  RADIOLOGY  ED MD interpretation:  US venous right lower extremity: No evidence of deep venous thrombosis in the right lower extremity  Official radiology report(s): US Venous Img Lower Unilateral Right  Result Date: 06/08/2018 CLINICAL DATA:  84 year old female with right lower extremity swelling and redness. EXAM: Right LOWER EXTREMITY VENOUS DOPPLER ULTRASOUND TECHNIQUE: Gray-scale sonography with graded compression, as well as color Doppler and duplex ultrasound were performed to evaluate the lower extremity deep venous systems from the level of the common femoral vein and including the common femoral, femoral, profunda femoral, popliteal and calf veins including the posterior tibial, peroneal and gastrocnemius veins when visible. The superficial great saphenous vein was also interrogated. Spectral Doppler was utilized to evaluate flow at rest and with distal augmentation maneuvers in the common femoral, femoral and popliteal veins. COMPARISON:  None. FINDINGS: Contralateral Common Femoral Vein: Respiratory phasicity is normal and symmetric with the symptomatic side. No evidence of thrombus. Normal compressibility. Common Femoral Vein: No evidence of thrombus. Normal compressibility, respiratory phasicity and response to augmentation. Saphenofemoral Junction: No evidence of thrombus. Normal compressibility and flow on color Doppler imaging. Profunda  Femoral Vein: No evidence of thrombus. Normal compressibility and flow on color Doppler imaging. Femoral Vein: No evidence of thrombus. Normal compressibility, respiratory phasicity and response to augmentation. Popliteal Vein: No evidence of thrombus. Normal compressibility, respiratory phasicity and response to augmentation. Calf Veins: No evidence of thrombus. Normal compressibility and flow on color Doppler imaging. Superficial Great Saphenous Vein: No evidence of thrombus. Normal compressibility. Venous Reflux:  None. Other Findings:  None. IMPRESSION: No evidence of deep venous thrombosis in the right lower extremity. Electronically Signed   By: Anner Crete M.D.   On: 06/08/2018 01:53    ____________________________________________   PROCEDURES  Procedure(s) performed: None  Procedures  Critical Care performed: No  ____________________________________________   INITIAL IMPRESSION / ASSESSMENT AND PLAN / ED COURSE  As part of my medical decision making, I reviewed the following data within the electronic MEDICAL RECORD NUMBER Notes from prior ED visits and [place] Controlled Substance Database   This is a 84 year old female who comes into the hospital today with some redness to her right lower extremity as well as a urinary tract infection and weakness.  We did check some blood work on the patient to include a CBC, BMP, lactic acid.  The patient's creatinine is mildly elevated at 1.77 and the patient's lactic acid is 2.3.  I did look back at the patient's notes and it appears that the patient did grow out pansensitive E. coli in the urine that she gave [place] a sample on Friday.  The patient also has some erythema to her right lower extremity that is hot with a concern for cellulitis.  I did give the patient a dose of vancomycin and Zosyn.  I sent the patient for an ultrasound to ensure that she did not have a DVT and her ultrasound was negative.  As the patient was tachycardic when she arrived  and has elevated lactic acid I gave her a liter of normal saline she will be admitted to the hospitalist service for IV antibiotics.      ____________________________________________   FINAL CLINICAL IMPRESSION(S) / ED DIAGNOSES  Final diagnoses:  Cellulitis of right lower extremity  Acute cystitis without hematuria     ED Discharge Orders    None       Note:  This document was prepared using Dragon voice recognition software and may include unintentional dictation errors.    Loney Hering, MD 06/08/18 [phone]

## 2018-06-08 NOTE — ED Notes (Signed)
Patient transported to 143

## 2018-06-08 NOTE — H&P (Signed)
Carol Bryan is an 84 y.o. female.   Chief Complaint: Urinary frequency HPI: The patient with past medical history of rheumatoid arthritis, hypertension hypothyroidism presents to the emergency department due to urinary frequency.  She was seen by her primary care physician a few days ago and was informed by telephone call that she may have a urinary tract infection.  Antibiotics were not prescribed and today she began to feel weak and nauseous.  She had multiple episodes of nonbloody nonbilious emesis.  She currently complains of left flank pain as well as leg swelling.  Physical exam was pertinent for erythema and swelling of her right lower extremity.  Antibiotics were started for cellulitis in addition to urinary tract infection prior to the emergency department staff calling the hospitalist service for admission.  Past Medical History:  Diagnosis Date  . Collagen vascular disease (HCC)    RA  . DVT (deep venous thrombosis) ([place])   . HLD (hyperlipidemia)   . Hypertension   . Hypothyroid   . Pulmonary embolism ([place])   . RA (rheumatoid arthritis) ([place])   . Sjogren's disease [facility])     Past Surgical History:  Procedure Laterality Date  . ABDOMINAL HYSTERECTOMY    . APPENDECTOMY    . ASD REPAIR  age 46  . cataracts    . JOINT REPLACEMENT Left    elbow  . PATENT DUCTUS ARTERIOUS REPAIR      Family History  Problem Relation Age of Onset  . Cancer Brother   . Stroke Mother   . Dementia Mother   . Kidney cancer Father   . Stroke Sister   . Cancer Sister   . Lung cancer Sister   . Liver cancer Sister    Social History:  reports that she has never smoked. She has never used smokeless tobacco. She reports that she drinks alcohol. She reports that she does not use drugs.  Allergies:  Allergies  Allergen Reactions  . Ciprofloxacin Other (See Comments)    Foot burning; tolerates levaquin  . Methotrexate Other (See Comments)    leukopenia  . Gold-Containing Drug Products Rash   . Other Other (See Comments) and Rash  . Sulfa Antibiotics Rash     (Not in a hospital admission)  Results for orders placed or performed during the hospital encounter of 06/08/18 (from the past 48 hour(s))  Basic metabolic panel     Status: Abnormal   Collection Time: 06/07/18 11:26 PM  Result Value Ref Range   Sodium 135 135 - 145 mmol/L   Potassium 4.1 3.5 - 5.1 mmol/L   Chloride 101 98 - 111 mmol/L    Comment: Please note change in reference range.   CO2 23 22 - 32 mmol/L   Glucose, Bld 173 (H) 70 - 99 mg/dL    Comment: Please note change in reference range.   BUN 30 (H) 8 - 23 mg/dL    Comment: Please note change in reference range.   Creatinine, Ser 1.77 (H) 0.44 - 1.00 mg/dL   Calcium 8.4 (L) 8.9 - 10.3 mg/dL   GFR calc non Af Amer 24 (L) >60 mL/min   GFR calc Af Amer 28 (L) >60 mL/min    Comment: (NOTE) The eGFR has been calculated using the CKD EPI equation. This calculation has not been validated in all clinical situations. eGFR's persistently <60 mL/min signify possible Chronic Kidney Disease.    Anion gap 11 5 - 15    Comment: Performed at [facility],  [place], [place] [address]  CBC     Status: Abnormal   Collection Time: 06/07/18 11:26 PM  Result Value Ref Range   WBC 6.7 3.6 - 11.0 K/uL   RBC 3.88 3.80 - 5.20 MIL/uL   Hemoglobin 11.7 (L) 12.0 - 16.0 g/dL   HCT 34.2 (L) 35.0 - 47.0 %   MCV 88.3 80.0 - 100.0 fL   MCH 30.3 26.0 - 34.0 pg   MCHC 34.3 32.0 - 36.0 g/dL   RDW 18.3 (H) 11.5 - 14.5 %   Platelets 127 (L) 150 - 440 K/uL    Comment: Performed at [facility], [address] [place], [place] [address]  Lactic acid, plasma     Status: Abnormal   Collection Time: 06/07/18 11:26 PM  Result Value Ref Range   Lactic Acid, Venous 2.3 (HH) 0.5 - 1.9 mmol/L    Comment: CRITICAL RESULT CALLED TO, READ BACK BY AND VERIFIED WITH ASHLEY SMITH AT 0002 06/08/18.PMH Performed at [facility], [address]  [place], [place] [address]   Lactic acid, plasma     Status: Abnormal   Collection Time: 06/08/18  1:52 AM  Result Value Ref Range   Lactic Acid, Venous 2.0 (HH) 0.5 - 1.9 mmol/L    Comment: CRITICAL RESULT CALLED TO, READ BACK BY AND VERIFIED WITH JENNA ELLINGTON AT [phone] 06/08/18.PMH Performed at [facility], [place], [place] [address]    US Venous Img Lower Unilateral Right  Result Date: 06/08/2018 CLINICAL DATA:  84 year old female with right lower extremity swelling and redness. EXAM: Right LOWER EXTREMITY VENOUS DOPPLER ULTRASOUND TECHNIQUE: Gray-scale sonography with graded compression, as well as color Doppler and duplex ultrasound were performed to evaluate the lower extremity deep venous systems from the level of the common femoral vein and including the common femoral, femoral, profunda femoral, popliteal and calf veins including the posterior tibial, peroneal and gastrocnemius veins when visible. The superficial great saphenous vein was also interrogated. Spectral Doppler was utilized to evaluate flow at rest and with distal augmentation maneuvers in the common femoral, femoral and popliteal veins. COMPARISON:  None. FINDINGS: Contralateral Common Femoral Vein: Respiratory phasicity is normal and symmetric with the symptomatic side. No evidence of thrombus. Normal compressibility. Common Femoral Vein: No evidence of thrombus. Normal compressibility, respiratory phasicity and response to augmentation. Saphenofemoral Junction: No evidence of thrombus. Normal compressibility and flow on color Doppler imaging. Profunda Femoral Vein: No evidence of thrombus. Normal compressibility and flow on color Doppler imaging. Femoral Vein: No evidence of thrombus. Normal compressibility, respiratory phasicity and response to augmentation. Popliteal Vein: No evidence of thrombus. Normal compressibility, respiratory phasicity and response to augmentation. Calf Veins: No evidence of thrombus.  Normal compressibility and flow on color Doppler imaging. Superficial Great Saphenous Vein: No evidence of thrombus. Normal compressibility. Venous Reflux:  None. Other Findings:  None. IMPRESSION: No evidence of deep venous thrombosis in the right lower extremity. Electronically Signed   By: Anner Crete M.D.   On: 06/08/2018 01:53    Review of Systems  Constitutional: Negative for chills and fever.  HENT: Negative for sore throat and tinnitus.   Eyes: Negative for blurred vision and redness.  Respiratory: Negative for cough and shortness of breath.   Cardiovascular: Positive for leg swelling. Negative for chest pain, palpitations, orthopnea and PND.  Gastrointestinal: Positive for abdominal pain (flank pain), nausea and vomiting. Negative for diarrhea.  Genitourinary: Positive for frequency. Negative for dysuria and urgency.  Musculoskeletal: Negative for joint pain and myalgias.  Skin: Negative for rash.       No lesions  Neurological: Negative for speech change, focal weakness and weakness.  Endo/Heme/Allergies: Does not bruise/bleed easily.       No temperature intolerance  Psychiatric/Behavioral: Negative for depression and suicidal ideas.    Blood pressure 114/68, pulse 99, temperature 99.2 F (37.3 C), resp. rate (!) 26, height 5' 2.5" (1.588 m), weight 69.9 kg (154 lb), SpO2 97 %. Physical Exam  Vitals reviewed. Constitutional: She is oriented to person, place, and time. She appears well-developed and well-nourished. No distress.  HENT:  Head: Normocephalic and atraumatic.  Mouth/Throat: Oropharynx is clear and moist.  Eyes: Pupils are equal, round, and reactive to light. Conjunctivae and EOM are normal. No scleral icterus.  Neck: Normal range of motion. Neck supple. No JVD present. No tracheal deviation present. No thyromegaly present.  Cardiovascular: Normal rate, regular rhythm and normal heart sounds. Exam reveals no gallop and no friction rub.  No murmur  heard. Respiratory: Effort normal and breath sounds normal.  GI: Soft. Bowel sounds are normal. She exhibits no distension. There is no tenderness.  Genitourinary:  Genitourinary Comments: Deferred  Musculoskeletal: Normal range of motion. She exhibits edema.  Lymphadenopathy:    She has no cervical adenopathy.  Neurological: She is alert and oriented to person, place, and time. No cranial nerve deficit. She exhibits normal muscle tone.  Skin: Skin is warm and dry. No rash noted. There is erythema.  Psychiatric: She has a normal mood and affect. Her behavior is normal. Judgment and thought content normal.     Assessment/Plan This is a 84 year old female admitted for acute on chronic kidney injury. 1.  Acute on chronic kidney injury: Hydrate with intravenous fluid.  Avoid nephrotoxic agents. 2.  Cellulitis: Wound on right lower leg with surrounding ecchymosis.  Oral Augmentin per cellulitis guidelines.  Incomplete criteria for sepsis.  The patient is alert and hemodynamically stable. 3.  UTI present on admission: She is already received vancomycin and Zosyn.  We will continue the latter.  Discontinue vancomycin if no MRSA isolated  4.  Hypothyroidism: TSH significantly elevated.  Likely secondary to not taking thyroxine on an empty stomach.  Encourage compliance.  Increase Synthroid if needed. 5.  Rheumatoid arthritis/Sjogren's: Continue Plaquenil and prednisone.  Also continue LiquiTears 6.  Atrial fib/flutter: Continue Eliquis and metoprolol 7.  DVT prophylaxis: Full dose anticoagulation as above 8.  GI prophylaxis: None The patient is a full code.  Time spent on admission orders and patient care proximately 45 minutes  Harrie Foreman, MD 06/08/2018, 3:13 AM

## 2018-06-08 NOTE — ED Notes (Signed)
Patient transported to Ultrasound 

## 2018-06-09 DIAGNOSIS — N179 Acute kidney failure, unspecified: Secondary | ICD-10-CM | POA: Diagnosis not present

## 2018-06-09 DIAGNOSIS — L03115 Cellulitis of right lower limb: Secondary | ICD-10-CM | POA: Diagnosis not present

## 2018-06-09 LAB — BASIC METABOLIC PANEL
Anion gap: 6 (ref 5–15)
BUN: 21 mg/dL (ref 8–23)
CO2: 26 mmol/L (ref 22–32)
CREATININE: 0.82 mg/dL (ref 0.44–1.00)
Calcium: 7.7 mg/dL — ABNORMAL LOW (ref 8.9–10.3)
Chloride: 109 mmol/L (ref 98–111)
GFR calc Af Amer: >60 mL/min (ref >60)
GLUCOSE: 113 mg/dL — AB (ref 70–99)
POTASSIUM: 3.8 mmol/L (ref 3.5–5.1)
Sodium: 141 mmol/L (ref 135–145)

## 2018-06-09 MED ORDER — SODIUM CHLORIDE 0.9 % IV SOLN
3.0000 g | Freq: Four times a day (QID) | INTRAVENOUS | Status: DC
  Administered 2018-06-09 – 2018-06-10 (×5): 3 g via INTRAVENOUS
  Filled 2018-06-09 (×7): qty 3

## 2018-06-09 NOTE — Progress Notes (Signed)
[place] [facility] [place] NAME: Carol Bryan    MR#:  [id number]  DATE OF BIRTH:  [birth year]  SUBJECTIVE:  CHIEF COMPLAINT:   Chief Complaint  Patient presents with  . Urinary Frequency   Generalized weakness.  Still right leg tenderness, swelling and erythema. REVIEW OF SYSTEMS:  Review of Systems  Constitutional: Positive for malaise/fatigue. Negative for chills and fever.  HENT: Negative for sore throat.   Eyes: Negative for blurred vision and double vision.  Respiratory: Negative for cough, hemoptysis, shortness of breath, wheezing and stridor.   Cardiovascular: Positive for leg swelling. Negative for chest pain, palpitations and orthopnea.  Gastrointestinal: Negative for abdominal pain, blood in stool, diarrhea, melena, nausea and vomiting.  Genitourinary: Negative for dysuria, flank pain and hematuria.  Musculoskeletal: Negative for back pain and joint pain.       Right leg redness, swelling and tenderness.  Skin: Negative for rash.  Neurological: Negative for dizziness, sensory change, focal weakness, seizures, loss of consciousness, weakness and headaches.  Endo/Heme/Allergies: Negative for polydipsia.  Psychiatric/Behavioral: Negative for depression. The patient is not nervous/anxious.     DRUG ALLERGIES:   Allergies  Allergen Reactions  . Ciprofloxacin Other (See Comments)    Foot burning; tolerates levaquin  . Methotrexate Other (See Comments)    leukopenia  . Gold-Containing Drug Products Rash  . Other Other (See Comments) and Rash  . Sulfa Antibiotics Rash   VITALS:  Blood pressure 109/67, pulse 91, temperature 97.6 F (36.4 C), temperature source Oral, resp. rate 18, height 5' 2.5" (1.588 m), weight 154 lb (69.9 kg), SpO2 97 %. PHYSICAL EXAMINATION:  Physical Exam  Constitutional: She is oriented to person, place, and time. She appears well-developed.  HENT:  Head: Normocephalic.  Mouth/Throat: Oropharynx is  clear and moist.  Eyes: Pupils are equal, round, and reactive to light. Conjunctivae and EOM are normal. No scleral icterus.  Neck: Normal range of motion. Neck supple. No JVD present. No tracheal deviation present.  Cardiovascular: Normal rate, regular rhythm and normal heart sounds. Exam reveals no gallop.  No murmur heard. Pulmonary/Chest: Effort normal and breath sounds normal. No respiratory distress. She has no wheezes. She has no rales.  Abdominal: Soft. Bowel sounds are normal. She exhibits no distension. There is no tenderness. There is no rebound.  Musculoskeletal: Normal range of motion. She exhibits no edema or tenderness.  Right leg tenderness, erythema and swelling.  Neurological: She is alert and oriented to person, place, and time. No cranial nerve deficit.  Skin: No rash noted. No erythema.  Psychiatric: She has a normal mood and affect.   LABORATORY PANEL:  Female CBC Recent Labs  Lab 06/07/18 2326  WBC 6.7  HGB 11.7*  HCT 34.2*  PLT 127*   ------------------------------------------------------------------------------------------------------------------ Chemistries  Recent Labs  Lab 06/09/18 0329  NA 141  K 3.8  CL 109  CO2 26  GLUCOSE 113*  BUN 21  CREATININE 0.82  CALCIUM 7.7*   RADIOLOGY:  No results found. ASSESSMENT AND PLAN:   This is a 84 year old female admitted for acute on chronic kidney injury. 1.  Acute on chronic kidney injury:  Improved with intravenous fluid.  Avoid nephrotoxic agents. 2.  Cellulitis of right leg.  Continue Unasyn IV.  3.  UTI. She received vancomycin and Zosyn.  Changed to Unasyn.  Follow-up urine culture. 4.  Hypothyroidism: Continue Synthroid. 5.  Rheumatoid arthritis/Sjogren's: Continue Plaquenil and prednisone.  Also continue LiquiTears 6.  Atrial fib/flutter:  Continue Eliquis and metoprolol.  Generalized weakness.  PT evaluation suggest home health and PT. All the records are reviewed and case discussed with  Care Management/Social Worker. Management plans discussed with the patient, her daughter and they are in agreement.  CODE STATUS: Full Code  TOTAL TIME TAKING CARE OF THIS PATIENT: 26 minutes.   More than 50% of the time was spent in counseling/coordination of care: YES  POSSIBLE D/C IN1-2 DAYS, DEPENDING ON CLINICAL CONDITION.   Demetrios Loll M.D on 06/09/2018 at 1:39 PM  Between 7am to 6pm - Pager - [phone]  After 6pm go to [email or web address] - [facility]

## 2018-06-09 NOTE — Consult Note (Signed)
Pharmacy Antibiotic Note  Carol Bryan is a 84 y.o. female admitted on 06/08/2018 with cellulitis and UTI.  Pharmacy has been consulted for Unasyn dosing. Patient was receiving amoxicillin 875 BID PTA for UTI. Patient receiving Vancomycin 1g IV and Zosyn 3.375 IV x 1 dose in ED.Marland Kitchen  Plan: Will increase Unasyn dose to 3g Q6H due to improved renal function. Will continue to monitor and adjust accordingly.   Height: 5' 2.5" (158.8 cm) Weight: 154 lb (69.9 kg) IBW/kg (Calculated) : 51.25  Temp (24hrs), Avg:98.1 F (36.7 C), Min:97.6 F (36.4 C), Max:98.5 F (36.9 C)  Recent Labs  Lab 06/07/18 2326 06/08/18 0152 06/09/18 0329  WBC 6.7  --   --   CREATININE 1.77*  --  0.82  LATICACIDVEN 2.3* 2.0*  --     Estimated Creatinine Clearance: 40.6 mL/min (by C-G formula based on SCr of 0.82 mg/dL).    Allergies  Allergen Reactions  . Ciprofloxacin Other (See Comments)    Foot burning; tolerates levaquin  . Methotrexate Other (See Comments)    leukopenia  . Gold-Containing Drug Products Rash  . Other Other (See Comments) and Rash  . Sulfa Antibiotics Rash    Antimicrobials this admission: 7/15 Vancomycin/Zosyn >> x1 dose  7/15 Unasyn >>    Microbiology results: 7/15 BCx: pending 7/11 UCx: E. Coli    Thank you for allowing pharmacy to be a part of this patient's care.  Lendon Ka, PharmD 06/09/2018 8:45 AM

## 2018-06-09 NOTE — Progress Notes (Signed)
Patient and family not compliant with leg elevation or ambulation to BR. Asked that staff ambulate to BR.

## 2018-06-09 NOTE — Progress Notes (Signed)
PHARMACY - PHYSICIAN COMMUNICATION CRITICAL VALUE ALERT - BLOOD CULTURE IDENTIFICATION (BCID)  Carol Bryan is an 84 y.o. female who presented to [facility]2018 with a chief complaint of urinary frequency and LLE redness and swelling  Assessment:  Vitals WNL, LA 2.3 >> 2.0, UA nitrite +, bacteria few, [place] - for DVT, 1/4 GPC BCID coag negative staph  Name of physician (or Provider) Contacted: Rosilyn Mings  Current antibiotics: Unasyn   Changes to prescribed antibiotics recommended:  Patient is on recommended antibiotics - No changes needed  Results for orders placed or performed during the hospital encounter of 06/08/18  Blood Culture ID Panel (Reflexed) (Collected: 06/07/2018 11:27 PM)  Result Value Ref Range   Enterococcus species NOT DETECTED NOT DETECTED   Listeria monocytogenes NOT DETECTED NOT DETECTED   Staphylococcus species DETECTED (A) NOT DETECTED   Staphylococcus aureus NOT DETECTED NOT DETECTED   Methicillin resistance NOT DETECTED NOT DETECTED   Streptococcus species NOT DETECTED NOT DETECTED   Streptococcus agalactiae NOT DETECTED NOT DETECTED   Streptococcus pneumoniae NOT DETECTED NOT DETECTED   Streptococcus pyogenes NOT DETECTED NOT DETECTED   Acinetobacter baumannii NOT DETECTED NOT DETECTED   Enterobacteriaceae species NOT DETECTED NOT DETECTED   Enterobacter cloacae complex NOT DETECTED NOT DETECTED   Escherichia coli NOT DETECTED NOT DETECTED   Klebsiella oxytoca NOT DETECTED NOT DETECTED   Klebsiella pneumoniae NOT DETECTED NOT DETECTED   Proteus species NOT DETECTED NOT DETECTED   Serratia marcescens NOT DETECTED NOT DETECTED   Haemophilus influenzae NOT DETECTED NOT DETECTED   Neisseria meningitidis NOT DETECTED NOT DETECTED   Pseudomonas aeruginosa NOT DETECTED NOT DETECTED   Candida albicans NOT DETECTED NOT DETECTED   Candida glabrata NOT DETECTED NOT DETECTED   Candida krusei NOT DETECTED NOT DETECTED   Candida parapsilosis NOT  DETECTED NOT DETECTED   Candida tropicalis NOT DETECTED NOT DETECTED   Tobie Lords, PharmD, BCPS Clinical Pharmacist 06/09/2018

## 2018-06-09 NOTE — Care Management Note (Signed)
Case Management Note  Patient Details  Name: Carol Bryan MRN: [id number] Date of Birth: [birth year]  Subjective/Objective:  Met with patient and her daughter at bedside. Patient states she lives alone. She has 3 daughters, all are nurses. 1 lives 10 minutes from her and she and her husband are in and out every day taking care of the patient. The other 2 daughters are also in and out through out the week helping with patient. The patient uses a cane if she goes out of the house. She also has a Corporate investment banker. Offered home health PT. Patient states she would like to think about it. She felt like it disrupted her schedule last time. She would like to discuss it more with her daughters. RNCM will check back with her at a later date regarding her decision. It is anticipated that patient will discharge tomorrow.                   Action/Plan:   Expected Discharge Date:  06/10/18               Expected Discharge Plan:     In-House Referral:     Discharge planning Services     Post Acute Care Choice:    Choice offered to:     DME Arranged:    DME Agency:     HH Arranged:    HH Agency:     Status of Service:     If discussed at [employer], dates discussed:    Additional Comments:  Jolly Mango, RN 06/09/2018, 9:55 AM

## 2018-06-10 LAB — CULTURE, BLOOD (ROUTINE X 2): SPECIAL REQUESTS: ADEQUATE

## 2018-06-10 MED ORDER — AMOXICILLIN-POT CLAVULANATE 250-125 MG PO TABS: 2.0000 | ORAL_TABLET | Freq: Two times a day (BID) | ORAL | 0 refills | Status: DC

## 2018-06-10 MED ORDER — AMOXICILLIN-POT CLAVULANATE 250-125 MG PO TABS
2.0000 | ORAL_TABLET | Freq: Two times a day (BID) | ORAL | Status: DC
  Filled 2018-06-10: qty 2

## 2018-06-10 NOTE — Discharge Summary (Signed)
[place] [facility] [place] NAME: Carol Bryan    MR#:  [id number]  DATE OF BIRTH:  09-14-1923  DATE OF ADMISSION:  06/08/2018   ADMITTING PHYSICIAN: Harrie Foreman, MD  DATE OF DISCHARGE: 06/10/2018 12:00 PM  PRIMARY CARE PHYSICIAN: [name], MD   ADMISSION DIAGNOSIS:  Cellulitis of right lower extremity [L03.115] Acute cystitis without hematuria [N30.00] DISCHARGE DIAGNOSIS:  Active Problems:   Acute-on-chronic kidney injury ([place])  SECONDARY DIAGNOSIS:   Past Medical History:  Diagnosis Date  . Collagen vascular disease (HCC)    RA  . DVT (deep venous thrombosis) ([place])   . HLD (hyperlipidemia)   . Hypertension   . Hypothyroid   . Pulmonary embolism ([place])   . RA (rheumatoid arthritis) ([place])   . Sjogren's disease [facility])    HOSPITAL COURSE:   This is a 84 year old female admitted for acute on chronic kidney injury. 1. Acute on chronic kidney injury:  Improved with intravenous fluid. Avoid nephrotoxic agents. 2. Cellulitis of right leg.    She has been treated with Unasyn IV. Changed to Augmentin 500 mg twice daily for 4 days. 3. UTI. She received vancomycin and Zosyn.  Changed to Unasyn.   Changed to Augmentin as above. Follow-up urine culture as outpatient. 4. Hypothyroidism: Continue Synthroid. 5. Rheumatoid arthritis/Sjogren's: Continue Plaquenil and prednisone. Also continue LiquiTears 6. Atrial fib/flutter: Continue Eliquis and metoprolol.  Generalized weakness.  PT evaluation suggest home health and PT. DISCHARGE CONDITIONS:  Stable, discharged to home with home health and PT today. CONSULTS OBTAINED:   DRUG ALLERGIES:   Allergies  Allergen Reactions  . Ciprofloxacin Other (See Comments)    Foot burning; tolerates levaquin  . Methotrexate Other (See Comments)    leukopenia  . Gold-Containing Drug Products Rash  . Other Other (See Comments) and Rash  . Sulfa Antibiotics Rash   DISCHARGE  MEDICATIONS:   Allergies as of 06/10/2018      Reactions   Ciprofloxacin Other (See Comments)   Foot burning; tolerates levaquin   Methotrexate Other (See Comments)   leukopenia   Gold-containing Drug Products Rash   Other Other (See Comments), Rash   Sulfa Antibiotics Rash      Medication List    TAKE these medications   amitriptyline 10 MG tablet Commonly known as:  ELAVIL Take 20 mg by mouth at bedtime.   amoxicillin-clavulanate 250-125 MG tablet Commonly known as:  AUGMENTIN Take 2 tablets by mouth every 12 (twelve) hours.   apixaban 2.5 MG Tabs tablet Commonly known as:  ELIQUIS Take 2.5 mg by mouth 2 (two) times daily.   diphenhydramine-acetaminophen 25-500 MG Tabs tablet Commonly known as:  TYLENOL PM Take 1 tablet by mouth at bedtime as needed.   folic acid 1 MG tablet Commonly known as:  FOLVITE Take 3 mg by mouth daily.   furosemide 20 MG tablet Commonly known as:  LASIX Take 20 mg by mouth daily.   hydroxychloroquine 200 MG tablet Commonly known as:  PLAQUENIL TAKE 1 TABLET DAILY   levothyroxine 50 MCG tablet Commonly known as:  SYNTHROID, LEVOTHROID Take 50 mcg by mouth daily before breakfast.   metoprolol succinate 12.5 mg Tb24 24 hr tablet Commonly known as:  TOPROL-XL Take 12.5 mg by mouth daily.   polyvinyl alcohol 1.4 % ophthalmic solution Commonly known as:  LIQUIFILM TEARS Place 1 drop into both eyes as needed for dry eyes.   potassium chloride SA 20 MEQ tablet Commonly known  as:  K-DUR,KLOR-CON Take 20 mEq by mouth daily.   predniSONE 5 MG tablet Commonly known as:  DELTASONE Take 5 mg by mouth 2 (two) times daily with a meal.   VITAMIN B 12 PO Take 1,000 mcg by mouth daily.   Vitamin D (Ergocalciferol) 50000 units Caps capsule Commonly known as:  DRISDOL Take 1,000 Units by mouth every 7 (seven) days.        DISCHARGE INSTRUCTIONS:  See AVS. If you experience worsening of your admission symptoms, develop shortness of  breath, life threatening emergency, suicidal or homicidal thoughts you must seek medical attention immediately by calling 911 or calling your MD immediately  if symptoms less severe.  You Must read complete instructions/literature along with all the possible adverse reactions/side effects for all the Medicines you take and that have been prescribed to you. Take any new Medicines after you have completely understood and accpet all the possible adverse reactions/side effects.   Please note  You were cared for by a hospitalist during your hospital stay. If you have any questions about your discharge medications or the care you received while you were in the hospital after you are discharged, you can call the unit and asked to speak with the hospitalist on call if the hospitalist that took care of you is not available. Once you are discharged, your primary care physician will handle any further medical issues. Please note that NO REFILLS for any discharge medications will be authorized once you are discharged, as it is imperative that you return to your primary care physician (or establish a relationship with a primary care physician if you do not have one) for your aftercare needs so that they can reassess your need for medications and monitor your lab values.    On the day of Discharge:  VITAL SIGNS:  Blood pressure 139/83, pulse 78, temperature (!) 97.4 F (36.3 C), temperature source Oral, resp. rate 18, height 5' 2.5" (1.588 m), weight 154 lb (69.9 kg), SpO2 90 %. PHYSICAL EXAMINATION:  GENERAL:  84 y.o.-year-old patient lying in the bed with no acute distress.  EYES: Pupils equal, round, reactive to light and accommodation. No scleral icterus. Extraocular muscles intact.  HEENT: Head atraumatic, normocephalic. Oropharynx and nasopharynx clear.  NECK:  Supple, no jugular venous distention. No thyroid enlargement, no tenderness.  LUNGS: Normal breath sounds bilaterally, no wheezing, rales,rhonchi  or crepitation. No use of accessory muscles of respiration.  CARDIOVASCULAR: S1, S2 normal. No murmurs, rubs, or gallops.  ABDOMEN: Soft, non-tender, non-distended. Bowel sounds present. No organomegaly or mass.  EXTREMITIES: No cyanosis, or clubbing.  Right leg swelling with bruises. NEUROLOGIC: Cranial nerves II through XII are intact. Muscle strength 5/5 in all extremities. Sensation intact. Gait not checked.  PSYCHIATRIC: The patient is alert and oriented x 3.  SKIN: No obvious rash, lesion, or ulcer.  DATA REVIEW:   CBC Recent Labs  Lab 06/07/18 2326  WBC 6.7  HGB 11.7*  HCT 34.2*  PLT 127*    Chemistries  Recent Labs  Lab 06/09/18 0329  NA 141  K 3.8  CL 109  CO2 26  GLUCOSE 113*  BUN 21  CREATININE 0.82  CALCIUM 7.7*     Microbiology Results  Results for orders placed or performed during the hospital encounter of 06/08/18  Blood culture (routine x 2)     Status: Abnormal   Collection Time: 06/07/18 11:27 PM  Result Value Ref Range Status   Specimen Description   Final  BLOOD BLOOD LEFT ARM Performed at [facility], [address] [place], [place] [address]    Special Requests   Final    BOTTLES DRAWN AEROBIC AND ANAEROBIC Blood Culture adequate volume Performed at [facility], [address] [place], [place] [address]    Culture  Setup Time   Final    Organism ID to follow [place] CRITICAL RESULT CALLED TO, READ BACK BY AND VERIFIED WITH: SCOTT CHRISTY 06/08/18 AT 2107 BY HS Performed at [facility], [address] [place], [place] [address]    Culture (A)  Final    STAPHYLOCOCCUS SPECIES (COAGULASE NEGATIVE) THE SIGNIFICANCE OF ISOLATING THIS ORGANISM FROM A SINGLE SET OF BLOOD CULTURES WHEN MULTIPLE SETS ARE DRAWN IS UNCERTAIN. PLEASE NOTIFY THE MICROBIOLOGY DEPARTMENT WITHIN ONE WEEK IF SPECIATION AND SENSITIVITIES ARE REQUIRED. Performed at [facility], [place] [address]  [place], [place] [address]    Report Status 06/10/2018 FINAL  Final  Blood Culture ID Panel (Reflexed)     Status: Abnormal   Collection Time: 06/07/18 11:27 PM  Result Value Ref Range Status   Enterococcus species NOT DETECTED NOT DETECTED Final   Listeria monocytogenes NOT DETECTED NOT DETECTED Final   Staphylococcus species DETECTED (A) NOT DETECTED Final    Comment: Methicillin (oxacillin) susceptible coagulase negative staphylococcus. Possible blood culture contaminant (unless isolated from more than one blood culture draw or clinical case suggests pathogenicity). No antibiotic treatment is indicated for blood  culture contaminants. CRITICAL RESULT CALLED TO, READ BACK BY AND VERIFIED WITH: SCOTT CHRISTY 06/08/18 AT 2107 BY HS    Staphylococcus aureus NOT DETECTED NOT DETECTED Final   Methicillin resistance NOT DETECTED NOT DETECTED Final   Streptococcus species NOT DETECTED NOT DETECTED Final   Streptococcus agalactiae NOT DETECTED NOT DETECTED Final   Streptococcus pneumoniae NOT DETECTED NOT DETECTED Final   Streptococcus pyogenes NOT DETECTED NOT DETECTED Final   Acinetobacter baumannii NOT DETECTED NOT DETECTED Final   Enterobacteriaceae species NOT DETECTED NOT DETECTED Final   Enterobacter cloacae complex NOT DETECTED NOT DETECTED Final   Escherichia coli NOT DETECTED NOT DETECTED Final   Klebsiella oxytoca NOT DETECTED NOT DETECTED Final   Klebsiella pneumoniae NOT DETECTED NOT DETECTED Final   Proteus species NOT DETECTED NOT DETECTED Final   Serratia marcescens NOT DETECTED NOT DETECTED Final   Haemophilus influenzae NOT DETECTED NOT DETECTED Final   Neisseria meningitidis NOT DETECTED NOT DETECTED Final   Pseudomonas aeruginosa NOT DETECTED NOT DETECTED Final   Candida albicans NOT DETECTED NOT DETECTED Final   Candida glabrata NOT DETECTED NOT DETECTED Final   Candida krusei NOT DETECTED NOT DETECTED Final   Candida parapsilosis NOT DETECTED NOT DETECTED Final   Candida  tropicalis NOT DETECTED NOT DETECTED Final    Comment: Performed at [facility], [address] [place], [place] [address]  Blood culture (routine x 2)     Status: None (Preliminary result)   Collection Time: 06/08/18 12:31 AM  Result Value Ref Range Status   Specimen Description BLOOD BLOOD RIGHT FOREARM  Final   Special Requests   Final    BOTTLES DRAWN AEROBIC AND ANAEROBIC Blood Culture results may not be optimal due to an excessive volume of blood received in culture bottles   Culture   Final    NO GROWTH 2 DAYS Performed at [facility], [address], [place], [place] [address]    Report Status PENDING  Incomplete  RADIOLOGY:  No results found.   Management plans discussed with the patient, her daughter and they are in agreement.  CODE STATUS: Full Code   TOTAL TIME TAKING CARE OF THIS PATIENT: 33 minutes.    Demetrios Loll M.D on 06/10/2018 at 2:19 PM  Between 7am to 6pm - Pager - [phone]  After 6pm go to [email or web address] - password [email or web address] [facility]  [facility] [place] [facility]  Office  [phone]  CC: Primary care physician; Adin Hector, MD   Note: This dictation was prepared with Dragon dictation along with smaller phrase technology. Any transcriptional errors that result from this process are unintentional.

## 2018-06-10 NOTE — Care Management Note (Signed)
Case Management Note  Patient Details  Name: YARELLY KUBA MRN: [id number] Date of Birth: 05-02-1923  Subjective/Objective:  Discharging today. Follow up with patient. She has decided she would like to pursue home health PT. She does not want to use [facility] again but some one local. Referral to [facility] for HHPT. No DME needs. Case closed.                  Action/Plan:   Expected Discharge Date:  06/10/18               Expected Discharge Plan:  [place]  In-House Referral:     Discharge planning Services  CM Consult  Post Acute Care Choice:  Home Health Choice offered to:  Patient  DME Arranged:    DME Agency:     HH Arranged:  PT [place]:  [place]  Status of Service:  Completed, signed off  If discussed at [place] of Stay Meetings, dates discussed:    Additional Comments:  Jolly Mango, RN 06/10/2018, 9:35 AM

## 2018-06-10 NOTE — Discharge Instructions (Signed)
HHPT Fall and aspiration precaution.

## 2018-06-11 LAB — URINE CULTURE: CULTURE: NO GROWTH

## 2018-06-13 LAB — CULTURE, BLOOD (ROUTINE X 2): Culture: NO GROWTH

## 2018-06-25 ENCOUNTER — Ambulatory Visit: Payer: Medicare Other | Admitting: Podiatry

## 2018-07-05 NOTE — Progress Notes (Signed)
[place]  [phone] [phone]  ID: Pilar Grammes OB: May 25, 1923  MR#: [id number]  PYP#:[id number]  Patient Care Team: Adin Hector, MD as PCP - General (Internal Medicine) Corey Skains, MD as Consulting Physician (Cardiology)  CHIEF COMPLAINT: Pancytopenia, PE/DVT.  INTERVAL HISTORY: Patient returns to clinic today for repeat laboratory work and routine evaluation.  She currently feels well and is asymptomatic.  She does not complain of weakness or fatigue today.  She has no neurologic complaints. She denies any recent fevers or illnesses. She has a good appetite and denies weight loss. She denies any chest pain or shortness of breath. She has no cough or hemoptysis. She denies any nausea, vomiting, constipation, or diarrhea.  She has no melena or hematochezia.  She has no urinary complaints.  Patient feels at her baseline offers no specific complaints today.  REVIEW OF SYSTEMS:   Review of Systems  Constitutional: Negative.  Negative for fever, malaise/fatigue and weight loss.  Respiratory: Negative.  Negative for cough, hemoptysis and shortness of breath.   Cardiovascular: Negative.  Negative for chest pain and leg swelling.  Gastrointestinal: Negative.  Negative for abdominal pain, blood in stool and melena.  Genitourinary: Negative.  Negative for hematuria.  Musculoskeletal: Negative.   Skin: Negative.  Negative for rash.  Neurological: Negative.  Negative for sensory change, focal weakness and weakness.  Endo/Heme/Allergies: Does not bruise/bleed easily.  Psychiatric/Behavioral: Negative.  The patient is not nervous/anxious.     As per HPI. Otherwise, a complete review of systems is negative.  PAST MEDICAL HISTORY: Past Medical History:  Diagnosis Date  . Collagen vascular disease (HCC)    RA  . DVT (deep venous thrombosis) ([place])   . HLD (hyperlipidemia)   . Hypertension   . Hypothyroid   . Pulmonary embolism ([place])     . RA (rheumatoid arthritis) ([place])   . Sjogren's disease ([place])     PAST SURGICAL HISTORY: Past Surgical History:  Procedure Laterality Date  . ABDOMINAL HYSTERECTOMY    . APPENDECTOMY    . ASD REPAIR  age 84  . cataracts    . JOINT REPLACEMENT Left    elbow  . PATENT DUCTUS ARTERIOUS REPAIR      FAMILY HISTORY: Family History  Problem Relation Age of Onset  . Cancer Brother   . Stroke Mother   . Dementia Mother   . Kidney cancer Father   . Stroke Sister   . Cancer Sister   . Lung cancer Sister   . Liver cancer Sister     ADVANCED DIRECTIVES (Y/N):  N  HEALTH MAINTENANCE: Social History   Tobacco Use  . Smoking status: Never Smoker  . Smokeless tobacco: Never Used  Substance Use Topics  . Alcohol use: Yes    Comment: occasionally   . Drug use: No     Colonoscopy:  PAP:  Bone density:  Lipid panel:  Allergies  Allergen Reactions  . Ciprofloxacin Other (See Comments)    Foot burning; tolerates levaquin  . Methotrexate Other (See Comments)    leukopenia  . Gold-Containing Drug Products Rash  . Other Other (See Comments) and Rash  . Sulfa Antibiotics Rash    Current Outpatient Medications  Medication Sig Dispense Refill  . amitriptyline (ELAVIL) 10 MG tablet Take 20 mg by mouth at bedtime.    Marland Kitchen apixaban (ELIQUIS) 2.5 MG TABS tablet Take 2.5 mg by mouth 2 (two) times daily.    . Cyanocobalamin (VITAMIN B 12  PO) Take 1,000 mcg by mouth daily.    . diphenhydramine-acetaminophen (TYLENOL PM) 25-500 MG TABS tablet Take 1 tablet by mouth at bedtime as needed.    . folic acid (FOLVITE) 1 MG tablet Take 3 mg by mouth daily.     . furosemide (LASIX) 20 MG tablet Take 20 mg by mouth daily.    . hydroxychloroquine (PLAQUENIL) 200 MG tablet TAKE 1 TABLET DAILY    . levothyroxine (SYNTHROID, LEVOTHROID) 50 MCG tablet Take 50 mcg by mouth daily before breakfast.    . metoprolol succinate (TOPROL-XL) 12.5 mg TB24 24 hr tablet Take 12.5 mg by mouth daily.    .  polyvinyl alcohol (LIQUIFILM TEARS) 1.4 % ophthalmic solution Place 1 drop into both eyes as needed for dry eyes.    . potassium chloride SA (K-DUR,KLOR-CON) 20 MEQ tablet Take 20 mEq by mouth daily.    . predniSONE (DELTASONE) 5 MG tablet Take 5 mg by mouth 2 (two) times daily with a meal.     . Vitamin D, Ergocalciferol, (DRISDOL) 50000 units CAPS capsule Take 1,000 Units by mouth daily.      No current facility-administered medications for this visit.     OBJECTIVE: Vitals:   07/06/18 1107  BP: (!) 141/82  Pulse: 76  Resp: 20  Temp: (!) 97.5 F (36.4 C)     Body mass index is 28.2 kg/m.    ECOG FS:0 - Asymptomatic  General: Well-developed, well-nourished, no acute distress. Eyes: Pink conjunctiva, anicteric sclera. HEENT: Normocephalic, moist mucous membranes. Lungs: Clear to auscultation bilaterally. Heart: Regular rate and rhythm. No rubs, murmurs, or gallops. Abdomen: Soft, nontender, nondistended. No organomegaly noted, normoactive bowel sounds. Musculoskeletal: No edema, cyanosis, or clubbing. Neuro: Alert, answering all questions appropriately. Cranial nerves grossly intact. Skin: No rashes or petechiae noted. Psych: Normal affect.  LAB RESULTS:  Lab Results  Component Value Date   NA 141 06/09/2018   K 3.8 06/09/2018   CL 109 06/09/2018   CO2 26 06/09/2018   GLUCOSE 113 (H) 06/09/2018   BUN 21 06/09/2018   CREATININE 0.82 06/09/2018   CALCIUM 7.7 (L) 06/09/2018   PROT 6.5 06/20/2013   ALBUMIN 3.4 06/20/2013   AST 26 06/20/2013   ALT 20 06/20/2013   ALKPHOS 78 06/20/2013   BILITOT 0.6 06/20/2013   GFRNONAA >60 06/09/2018   GFRAA >60 06/09/2018    Lab Results  Component Value Date   WBC 3.5 (L) 07/06/2018   NEUTROABS 2.4 07/06/2018   HGB 11.4 (L) 07/06/2018   HCT 34.1 (L) 07/06/2018   MCV 89.8 07/06/2018   PLT 120 (L) 07/06/2018     STUDIES: No results found.  ASSESSMENT: Pancytopenia, PE/DVT.  PLAN:    1. Pancytopenia: Patient's white  blood cell count, hemoglobin, platelets remain decreased, but essentially stable. Previously, the remainder of her blood work including iron stores, B-12, folate, reticulocyte count, and hemolysis labs are all within normal limits. Antineutrophil antibodies are positive, but likely clinically insignificant. No intervention is needed at this time. Patient does not require bone marrow biopsy, but would consider one in the future if her blood counts continue to trend down.  Return to clinic in 6 months with repeat laboratory work and further evaluation. 2.  Anemia: Hemoglobin essentially stable at 11.4.  Iron stores drawn on July 06, 2018 are within normal limits  3. Pulmonary embolism, left popliteal DVT: Patient continues to take Eliquis, but now it appears this is for her underlying cardiac issues.  Continue treatment and  management per primary care.    I spent a total of 20 minutes face-to-face with the patient of which greater than 50% of the visit was spent in counseling and coordination of care as detailed above.    Patient expressed understanding and was in agreement with this plan. She also understands that She can call clinic at any time with any questions, concerns, or complaints.    Lloyd Huger, MD   07/08/2018 1:29 PM

## 2018-07-06 ENCOUNTER — Inpatient Hospital Stay: Payer: Medicare Other

## 2018-07-06 ENCOUNTER — Encounter: Payer: Self-pay | Admitting: Oncology

## 2018-07-06 ENCOUNTER — Inpatient Hospital Stay: Payer: Medicare Other | Attending: Oncology

## 2018-07-06 ENCOUNTER — Inpatient Hospital Stay (HOSPITAL_BASED_OUTPATIENT_CLINIC_OR_DEPARTMENT_OTHER): Payer: Medicare Other | Admitting: Oncology

## 2018-07-06 VITALS — BP 141/82 | HR 76 | Temp 97.5°F | Resp 20 | Wt 156.7 lb

## 2018-07-06 DIAGNOSIS — I2699 Other pulmonary embolism without acute cor pulmonale: Secondary | ICD-10-CM

## 2018-07-06 DIAGNOSIS — Z86718 Personal history of other venous thrombosis and embolism: Secondary | ICD-10-CM | POA: Diagnosis not present

## 2018-07-06 DIAGNOSIS — Z86711 Personal history of pulmonary embolism: Secondary | ICD-10-CM | POA: Insufficient documentation

## 2018-07-06 DIAGNOSIS — Z701 Counseling related to patient's sexual behavior and orientation: Secondary | ICD-10-CM | POA: Insufficient documentation

## 2018-07-06 DIAGNOSIS — Z79899 Other long term (current) drug therapy: Secondary | ICD-10-CM

## 2018-07-06 DIAGNOSIS — D61818 Other pancytopenia: Secondary | ICD-10-CM

## 2018-07-06 DIAGNOSIS — D649 Anemia, unspecified: Secondary | ICD-10-CM | POA: Diagnosis not present

## 2018-07-06 DIAGNOSIS — I82432 Acute embolism and thrombosis of left popliteal vein: Secondary | ICD-10-CM

## 2018-07-06 DIAGNOSIS — Z7901 Long term (current) use of anticoagulants: Secondary | ICD-10-CM

## 2018-07-06 LAB — IRON AND TIBC
IRON: 61 ug/dL (ref 28–170)
SATURATION RATIOS: 19 % (ref 10.4–31.8)
TIBC: 327 ug/dL (ref 250–450)
UIBC: 267 ug/dL

## 2018-07-06 LAB — CBC WITH DIFFERENTIAL/PLATELET
BASOS ABS: 0 10*3/uL (ref 0–0.1)
BASOS PCT: 1 %
EOS ABS: 0 10*3/uL (ref 0–0.7)
EOS PCT: 0 %
HCT: 34.1 % — ABNORMAL LOW (ref 35.0–47.0)
Hemoglobin: 11.4 g/dL — ABNORMAL LOW (ref 12.0–16.0)
Lymphocytes Relative: 15 %
Lymphs Abs: 0.5 10*3/uL — ABNORMAL LOW (ref 1.0–3.6)
MCH: 30 pg (ref 26.0–34.0)
MCHC: 33.4 g/dL (ref 32.0–36.0)
MCV: 89.8 fL (ref 80.0–100.0)
MONO ABS: 0.6 10*3/uL (ref 0.2–0.9)
MONOS PCT: 16 %
Neutro Abs: 2.4 10*3/uL (ref 1.4–6.5)
Neutrophils Relative %: 68 %
PLATELETS: 120 10*3/uL — AB (ref 150–440)
RBC: 3.8 MIL/uL (ref 3.80–5.20)
RDW: 18.3 % — AB (ref 11.5–14.5)
WBC: 3.5 10*3/uL — ABNORMAL LOW (ref 3.6–11.0)

## 2018-07-06 LAB — FERRITIN: FERRITIN: 158 ng/mL (ref 11–307)

## 2018-07-06 NOTE — Progress Notes (Signed)
Patient denies any concerns today.  

## 2018-07-29 ENCOUNTER — Other Ambulatory Visit: Payer: Self-pay | Admitting: Internal Medicine

## 2018-07-29 DIAGNOSIS — I728 Aneurysm of other specified arteries: Secondary | ICD-10-CM

## 2018-09-30 ENCOUNTER — Ambulatory Visit
Admission: RE | Admit: 2018-09-30 | Discharge: 2018-09-30 | Disposition: A | Discharge status: Home / Self Care | Payer: Medicare Other | Source: Ambulatory Visit | Attending: Physician Assistant | Admitting: Physician Assistant

## 2018-09-30 ENCOUNTER — Encounter: Payer: Self-pay | Admitting: Emergency Medicine

## 2018-09-30 ENCOUNTER — Other Ambulatory Visit
Admission: RE | Admit: 2018-09-30 | Discharge: 2018-09-30 | Disposition: A | Discharge status: Home / Self Care | Payer: Medicare Other | Source: Ambulatory Visit | Attending: Physician Assistant | Admitting: Physician Assistant

## 2018-09-30 ENCOUNTER — Emergency Department: Payer: Medicare Other

## 2018-09-30 ENCOUNTER — Other Ambulatory Visit: Payer: Self-pay

## 2018-09-30 ENCOUNTER — Other Ambulatory Visit: Payer: Self-pay | Admitting: Physician Assistant

## 2018-09-30 ENCOUNTER — Inpatient Hospital Stay
Admission: EM | Admit: 2018-09-30 | Discharge: 2018-10-03 | DRG: 392 | Disposition: A | Discharge status: Home Health | Payer: Medicare Other | Attending: Internal Medicine | Admitting: Internal Medicine

## 2018-09-30 DIAGNOSIS — K769 Liver disease, unspecified: Secondary | ICD-10-CM | POA: Diagnosis present

## 2018-09-30 DIAGNOSIS — D61818 Other pancytopenia: Secondary | ICD-10-CM | POA: Diagnosis present

## 2018-09-30 DIAGNOSIS — Z86711 Personal history of pulmonary embolism: Secondary | ICD-10-CM

## 2018-09-30 DIAGNOSIS — R402250 Coma scale, best verbal response, oriented, unspecified time: Secondary | ICD-10-CM | POA: Diagnosis present

## 2018-09-30 DIAGNOSIS — R000 Tachycardia, unspecified: Secondary | ICD-10-CM | POA: Diagnosis present

## 2018-09-30 DIAGNOSIS — Z881 Allergy status to other antibiotic agents status: Secondary | ICD-10-CM | POA: Diagnosis not present

## 2018-09-30 DIAGNOSIS — E039 Hypothyroidism, unspecified: Secondary | ICD-10-CM | POA: Diagnosis present

## 2018-09-30 DIAGNOSIS — N281 Cyst of kidney, acquired: Secondary | ICD-10-CM

## 2018-09-30 DIAGNOSIS — D7389 Other diseases of spleen: Secondary | ICD-10-CM | POA: Diagnosis present

## 2018-09-30 DIAGNOSIS — D509 Iron deficiency anemia, unspecified: Secondary | ICD-10-CM | POA: Diagnosis not present

## 2018-09-30 DIAGNOSIS — K5792 Diverticulitis of intestine, part unspecified, without perforation or abscess without bleeding: Secondary | ICD-10-CM | POA: Diagnosis present

## 2018-09-30 DIAGNOSIS — I728 Aneurysm of other specified arteries: Secondary | ICD-10-CM | POA: Insufficient documentation

## 2018-09-30 DIAGNOSIS — R0789 Other chest pain: Secondary | ICD-10-CM

## 2018-09-30 DIAGNOSIS — R197 Diarrhea, unspecified: Secondary | ICD-10-CM

## 2018-09-30 DIAGNOSIS — Z9109 Other allergy status, other than to drugs and biological substances: Secondary | ICD-10-CM

## 2018-09-30 DIAGNOSIS — N179 Acute kidney failure, unspecified: Secondary | ICD-10-CM | POA: Diagnosis present

## 2018-09-30 DIAGNOSIS — D869 Sarcoidosis, unspecified: Secondary | ICD-10-CM

## 2018-09-30 DIAGNOSIS — R1012 Left upper quadrant pain: Secondary | ICD-10-CM

## 2018-09-30 DIAGNOSIS — Z86718 Personal history of other venous thrombosis and embolism: Secondary | ICD-10-CM

## 2018-09-30 DIAGNOSIS — Z79899 Other long term (current) drug therapy: Secondary | ICD-10-CM

## 2018-09-30 DIAGNOSIS — N8110 Cystocele, unspecified: Secondary | ICD-10-CM | POA: Insufficient documentation

## 2018-09-30 DIAGNOSIS — R402360 Coma scale, best motor response, obeys commands, unspecified time: Secondary | ICD-10-CM | POA: Diagnosis present

## 2018-09-30 DIAGNOSIS — I10 Essential (primary) hypertension: Secondary | ICD-10-CM | POA: Diagnosis present

## 2018-09-30 DIAGNOSIS — R531 Weakness: Secondary | ICD-10-CM | POA: Diagnosis present

## 2018-09-30 DIAGNOSIS — M3500 Sicca syndrome, unspecified: Secondary | ICD-10-CM | POA: Diagnosis present

## 2018-09-30 DIAGNOSIS — R402140 Coma scale, eyes open, spontaneous, unspecified time: Secondary | ICD-10-CM | POA: Diagnosis present

## 2018-09-30 DIAGNOSIS — Z7901 Long term (current) use of anticoagulants: Secondary | ICD-10-CM

## 2018-09-30 DIAGNOSIS — E611 Iron deficiency: Secondary | ICD-10-CM | POA: Diagnosis present

## 2018-09-30 DIAGNOSIS — M069 Rheumatoid arthritis, unspecified: Secondary | ICD-10-CM | POA: Diagnosis present

## 2018-09-30 DIAGNOSIS — Z96622 Presence of left artificial elbow joint: Secondary | ICD-10-CM | POA: Diagnosis present

## 2018-09-30 DIAGNOSIS — E785 Hyperlipidemia, unspecified: Secondary | ICD-10-CM | POA: Diagnosis present

## 2018-09-30 DIAGNOSIS — R160 Hepatomegaly, not elsewhere classified: Secondary | ICD-10-CM | POA: Diagnosis not present

## 2018-09-30 DIAGNOSIS — E860 Dehydration: Secondary | ICD-10-CM | POA: Diagnosis present

## 2018-09-30 DIAGNOSIS — K5732 Diverticulitis of large intestine without perforation or abscess without bleeding: Principal | ICD-10-CM | POA: Diagnosis present

## 2018-09-30 DIAGNOSIS — Q433 Congenital malformations of intestinal fixation: Secondary | ICD-10-CM | POA: Insufficient documentation

## 2018-09-30 DIAGNOSIS — Z7952 Long term (current) use of systemic steroids: Secondary | ICD-10-CM

## 2018-09-30 DIAGNOSIS — D735 Infarction of spleen: Secondary | ICD-10-CM | POA: Diagnosis present

## 2018-09-30 DIAGNOSIS — R918 Other nonspecific abnormal finding of lung field: Secondary | ICD-10-CM | POA: Insufficient documentation

## 2018-09-30 DIAGNOSIS — I700 Atherosclerosis of aorta: Secondary | ICD-10-CM | POA: Insufficient documentation

## 2018-09-30 DIAGNOSIS — I248 Other forms of acute ischemic heart disease: Secondary | ICD-10-CM | POA: Diagnosis present

## 2018-09-30 DIAGNOSIS — Z9049 Acquired absence of other specified parts of digestive tract: Secondary | ICD-10-CM

## 2018-09-30 DIAGNOSIS — Z882 Allergy status to sulfonamides status: Secondary | ICD-10-CM

## 2018-09-30 DIAGNOSIS — Z888 Allergy status to other drugs, medicaments and biological substances status: Secondary | ICD-10-CM

## 2018-09-30 DIAGNOSIS — Z8679 Personal history of other diseases of the circulatory system: Secondary | ICD-10-CM

## 2018-09-30 DIAGNOSIS — Z91048 Other nonmedicinal substance allergy status: Secondary | ICD-10-CM

## 2018-09-30 HISTORY — DX: Unspecified atrial flutter: I48.92

## 2018-09-30 LAB — GLUCOSE, CAPILLARY: Glucose-Capillary: 89 mg/dL (ref 70–99)

## 2018-09-30 LAB — CREATININE, SERUM
Creatinine, Ser: 1.11 mg/dL — ABNORMAL HIGH (ref 0.44–1.00)
GFR calc non Af Amer: 41 mL/min — ABNORMAL LOW (ref >60)
GFR, EST AFRICAN AMERICAN: 48 mL/min — AB (ref >60)

## 2018-09-30 LAB — TROPONIN I: TROPONIN I: 0.04 ng/mL — AB (ref <0.03)

## 2018-09-30 MED ORDER — SODIUM CHLORIDE 0.9 % IV BOLUS
500.0000 mL | Freq: Once | INTRAVENOUS | Status: AC
  Administered 2018-09-30: 500 mL via INTRAVENOUS

## 2018-09-30 MED ORDER — HYDROCODONE-ACETAMINOPHEN 5-325 MG PO TABS
1.0000 | ORAL_TABLET | ORAL | Status: DC | PRN
  Administered 2018-10-01: 1 via ORAL
  Filled 2018-09-30: qty 1

## 2018-09-30 MED ORDER — ONDANSETRON HCL 4 MG PO TABS: 4.0000 mg | ORAL_TABLET | Freq: Four times a day (QID) | ORAL | Status: DC | PRN

## 2018-09-30 MED ORDER — POLYVINYL ALCOHOL 1.4 % OP SOLN
1.0000 [drp] | OPHTHALMIC | Status: DC | PRN
  Filled 2018-09-30: qty 15

## 2018-09-30 MED ORDER — PIPERACILLIN-TAZOBACTAM 3.375 G IVPB 30 MIN
3.3750 g | Freq: Once | INTRAVENOUS | Status: AC
  Administered 2018-09-30: 3.375 g via INTRAVENOUS
  Filled 2018-09-30: qty 50

## 2018-09-30 MED ORDER — SODIUM CHLORIDE 0.9 % IV SOLN
Freq: Once | INTRAVENOUS | Status: AC
  Administered 2018-09-30: 23:00:00 via INTRAVENOUS

## 2018-09-30 MED ORDER — LEVOTHYROXINE SODIUM 50 MCG PO TABS
50.0000 ug | ORAL_TABLET | Freq: Every day | ORAL | Status: DC
  Administered 2018-10-01 – 2018-10-03 (×3): 50 ug via ORAL
  Filled 2018-09-30 (×3): qty 1

## 2018-09-30 MED ORDER — PIPERACILLIN-TAZOBACTAM 3.375 G IVPB
3.3750 g | Freq: Three times a day (TID) | INTRAVENOUS | Status: DC
  Administered 2018-10-01 – 2018-10-02 (×4): 3.375 g via INTRAVENOUS
  Filled 2018-09-30 (×5): qty 50

## 2018-09-30 MED ORDER — IOPAMIDOL (ISOVUE-300) INJECTION 61%
100.0000 mL | Freq: Once | INTRAVENOUS | Status: AC | PRN
  Administered 2018-09-30: 100 mL via INTRAVENOUS

## 2018-09-30 MED ORDER — FOLIC ACID 1 MG PO TABS
3.0000 mg | ORAL_TABLET | Freq: Every day | ORAL | Status: DC
  Administered 2018-10-01 – 2018-10-03 (×3): 3 mg via ORAL
  Filled 2018-09-30 (×3): qty 3

## 2018-09-30 MED ORDER — ONDANSETRON HCL 4 MG/2ML IJ SOLN
4.0000 mg | Freq: Four times a day (QID) | INTRAMUSCULAR | Status: DC | PRN
  Administered 2018-10-02: 4 mg via INTRAVENOUS
  Filled 2018-09-30: qty 2

## 2018-09-30 MED ORDER — LIDOCAINE 5 % EX PTCH
1.0000 | MEDICATED_PATCH | Freq: Every day | CUTANEOUS | Status: DC
  Administered 2018-10-01 – 2018-10-03 (×3): 1 via TRANSDERMAL
  Filled 2018-09-30 (×3): qty 1

## 2018-09-30 MED ORDER — AMITRIPTYLINE HCL 10 MG PO TABS
20.0000 mg | ORAL_TABLET | Freq: Every day | ORAL | Status: DC
  Administered 2018-09-30 – 2018-10-02 (×3): 20 mg via ORAL
  Filled 2018-09-30 (×4): qty 2

## 2018-09-30 MED ORDER — APIXABAN 2.5 MG PO TABS
2.5000 mg | ORAL_TABLET | Freq: Two times a day (BID) | ORAL | Status: DC
  Administered 2018-09-30 – 2018-10-02 (×4): 2.5 mg via ORAL
  Filled 2018-09-30 (×4): qty 1

## 2018-09-30 MED ORDER — PREDNISONE 10 MG PO TABS
5.0000 mg | ORAL_TABLET | Freq: Two times a day (BID) | ORAL | Status: DC
  Administered 2018-10-01 – 2018-10-03 (×5): 5 mg via ORAL
  Filled 2018-09-30 (×5): qty 1

## 2018-09-30 MED ORDER — VITAMIN D 25 MCG (1000 UNIT) PO TABS: 1000.0000 [IU] | ORAL_TABLET | ORAL | Status: DC

## 2018-09-30 MED ORDER — HYDROXYCHLOROQUINE SULFATE 200 MG PO TABS
200.0000 mg | ORAL_TABLET | Freq: Every day | ORAL | Status: DC
  Administered 2018-10-01 – 2018-10-03 (×3): 200 mg via ORAL
  Filled 2018-09-30 (×3): qty 1

## 2018-09-30 MED ORDER — POTASSIUM CHLORIDE CRYS ER 20 MEQ PO TBCR
20.0000 meq | EXTENDED_RELEASE_TABLET | Freq: Every day | ORAL | Status: DC
  Administered 2018-10-01 – 2018-10-03 (×3): 20 meq via ORAL
  Filled 2018-09-30 (×3): qty 1

## 2018-09-30 MED ORDER — MORPHINE SULFATE (PF) 2 MG/ML IV SOLN
2.0000 mg | Freq: Once | INTRAVENOUS | Status: AC
  Administered 2018-09-30: 2 mg via INTRAVENOUS
  Filled 2018-09-30: qty 1

## 2018-09-30 MED ORDER — ONDANSETRON HCL 4 MG/2ML IJ SOLN
4.0000 mg | Freq: Once | INTRAMUSCULAR | Status: AC
  Administered 2018-09-30: 4 mg via INTRAVENOUS
  Filled 2018-09-30: qty 2

## 2018-09-30 MED ORDER — AZELASTINE HCL 0.1 % NA SOLN
1.0000 | Freq: Two times a day (BID) | NASAL | Status: DC
  Administered 2018-10-02 – 2018-10-03 (×3): 1 via NASAL
  Filled 2018-09-30 (×2): qty 30

## 2018-09-30 MED ORDER — DOCUSATE SODIUM 100 MG PO CAPS
100.0000 mg | ORAL_CAPSULE | Freq: Two times a day (BID) | ORAL | Status: DC
  Administered 2018-10-01 – 2018-10-03 (×5): 100 mg via ORAL
  Filled 2018-09-30 (×5): qty 1

## 2018-09-30 MED ORDER — TRAZODONE HCL 50 MG PO TABS
25.0000 mg | ORAL_TABLET | Freq: Every evening | ORAL | Status: DC | PRN
  Administered 2018-09-30 – 2018-10-02 (×3): 25 mg via ORAL
  Filled 2018-09-30 (×3): qty 1

## 2018-09-30 MED ORDER — ACETAMINOPHEN 325 MG PO TABS
650.0000 mg | ORAL_TABLET | Freq: Four times a day (QID) | ORAL | Status: DC | PRN
  Administered 2018-10-02: 650 mg via ORAL
  Filled 2018-09-30: qty 2

## 2018-09-30 MED ORDER — ACETAMINOPHEN 650 MG RE SUPP: 650.0000 mg | Freq: Four times a day (QID) | RECTAL | Status: DC | PRN

## 2018-09-30 MED ORDER — BISACODYL 5 MG PO TBEC
5.0000 mg | DELAYED_RELEASE_TABLET | Freq: Every day | ORAL | Status: DC | PRN
  Administered 2018-10-02: 5 mg via ORAL
  Filled 2018-09-30: qty 1

## 2018-09-30 MED ORDER — METOPROLOL SUCCINATE ER 25 MG PO TB24
12.5000 mg | ORAL_TABLET | Freq: Every day | ORAL | Status: DC
  Administered 2018-10-01 – 2018-10-02 (×3): 12.5 mg via ORAL
  Filled 2018-09-30 (×3): qty 1

## 2018-09-30 NOTE — ED Triage Notes (Signed)
Patient has been sick with left upper quad pain and some chest pain and not feeling well for several days. Went to her doctor and had labs and was told to come here for elevated troponin.

## 2018-09-30 NOTE — ED Notes (Signed)
Patient transported to Ultrasound 

## 2018-09-30 NOTE — Progress Notes (Signed)
Pharmacy Antibiotic Note  Carol Bryan is a 84 y.o. female admitted on 09/30/2018 with Intra-Abdominal infection.  Pharmacy has been consulted for Zosyn dosing.  Plan: Zosyn 3.375g IV q8h (4 hour infusion).  Scr pending  Height: 5\' 2"  (157.5 cm) Weight: 152 lb (68.9 kg) IBW/kg (Calculated) : 50.1  Temp (24hrs), Avg:97.6 F (36.4 C), Min:97.6 F (36.4 C), Max:97.6 F (36.4 C)  No results for input(s): WBC, CREATININE, LATICACIDVEN, VANCOTROUGH, VANCOPEAK, VANCORANDOM, GENTTROUGH, GENTPEAK, GENTRANDOM, TOBRATROUGH, TOBRAPEAK, TOBRARND, AMIKACINPEAK, AMIKACINTROU, AMIKACIN in the last 168 hours.  CrCl cannot be calculated (Patient's most recent lab result is older than the maximum 21 days allowed.).    Allergies  Allergen Reactions  . Ciprofloxacin Other (See Comments)    Foot burning; tolerates levaquin  . Methotrexate Other (See Comments)    leukopenia  . Gold-Containing Drug Products Rash  . Other Other (See Comments) and Rash  . Sulfa Antibiotics Rash    Antimicrobials this admission: Zosyn 11/6 >>       >>    Dose adjustments this admission:    Microbiology results:   BCx:     UCx:      Sputum:      MRSA PCR:    Thank you for allowing pharmacy to be a part of this patient's care.  Mackenna Kamer A 09/30/2018 8:28 PM

## 2018-09-30 NOTE — H&P (Addendum)
[facility] [place] NAME: Carol Bryan    MR#:  [id number]  DATE OF BIRTH:  11/17/1923  DATE OF ADMISSION:  09/30/2018  PRIMARY CARE PHYSICIAN: Adin Hector, MD   REQUESTING/REFERRING PHYSICIAN:   CHIEF COMPLAINT:   Chief Complaint  Patient presents with  . Abdominal Pain  . Chest Pain    HISTORY OF PRESENT ILLNESS: Carol Bryan  is a 84 y.o. female with a known history of atrial flutter, DVT, hypertension, PE, rheumatoid arthritis, Sjogren's disease. Patient was brought to emergency room for nausea, vomiting, LLQ pain and diarrhea, going on for the past 2 days, gradually worsening.  She took Imodium just before coming to emergency room which seemed to stop diarrhea.  She still has nausea and abdominal pain.  No fevers/chills, no bleeding.  No sick contacts, no recent travel.  Patient also reported one episode of retrosternal chest pain after vomiting, described as mild to moderate burning pressure.  Patient is currently chest pain-free.  No shortness of breath, no palpitations. Blood test done emergency room are notable for slightly elevated creatinine level of 1.11 and borderline elevated troponin level is 0.04. EKG shows his tachycardia with heart rate 110 bpm, no acute ischemic changes. Abdominal CAT scan shows moderate but focal acute sigmoid colon diverticulitis with diffuse colonic diverticulosis. Radiology also reports as incidental findings: Multiple new bibasilar pulmonary nodules measuring up to 2.1 cm in long axis. Although the patient does seem to have a history of waxing and waning pulmonary nodules which could be from atypical infectious process, there are new hypoenhancing lesions in the liver and in the spleen along with splenomegaly and likely a splenic infarct. This raises concern for lymphoproliferative malignancy or other malignancy with metastatic disease to the liver and spleen.  Patient is admitted for further  evaluation and treatment.  PAST MEDICAL HISTORY:   Past Medical History:  Diagnosis Date  . Atrial flutter ([place])    cardioverted  . Collagen vascular disease ([facility])    RA  . DVT (deep venous thrombosis) ([place])   . HLD (hyperlipidemia)   . Hypertension   . Hypothyroid   . Pulmonary embolism ([place])   . RA (rheumatoid arthritis) ([place])   . Sjogren's disease ([place])     PAST SURGICAL HISTORY:  Past Surgical History:  Procedure Laterality Date  . ABDOMINAL HYSTERECTOMY    . APPENDECTOMY    . ASD REPAIR  age 66  . cataracts    . JOINT REPLACEMENT Left    elbow  . PATENT DUCTUS ARTERIOUS REPAIR      SOCIAL HISTORY:  Social History   Tobacco Use  . Smoking status: Never Smoker  . Smokeless tobacco: Never Used  Substance Use Topics  . Alcohol use: Yes    Comment: occasionally     FAMILY HISTORY:  Family History  Problem Relation Age of Onset  . Cancer Brother   . Stroke Mother   . Dementia Mother   . Kidney cancer Father   . Stroke Sister   . Cancer Sister   . Lung cancer Sister   . Liver cancer Sister     DRUG ALLERGIES:  Allergies  Allergen Reactions  . Ciprofloxacin Other (See Comments)    Foot burning; tolerates levaquin  . Methotrexate Other (See Comments)    leukopenia  . Gold-Containing Drug Products Rash  . Other Other (See Comments) and Rash  . Sulfa Antibiotics Rash    REVIEW OF SYSTEMS:  CONSTITUTIONAL: No fever, fatigue or weakness.  EYES: No changes in vision.  EARS, NOSE, AND THROAT: No tinnitus or ear pain.  RESPIRATORY: No cough, shortness of breath, wheezing or hemoptysis.  CARDIOVASCULAR: Positive for chest pain, no orthopnea/edema.  GASTROINTESTINAL: Positive for nausea, vomiting, diarrhea and abdominal pain.  GENITOURINARY: No dysuria, hematuria.  ENDOCRINE: No polyuria, nocturia. HEMATOLOGY: No bleeding. SKIN: No rash or lesion. MUSCULOSKELETAL: No joint pain at this time.   NEUROLOGIC: No focal weakness.  PSYCHIATRY: No anxiety  or depression.   MEDICATIONS AT HOME:  Prior to Admission medications   Medication Sig Start Date End Date Taking? Authorizing Provider  acetaminophen (TYLENOL) 500 MG tablet Take 500 mg by mouth daily as needed for mild pain or moderate pain.   Yes [provider]  amitriptyline (ELAVIL) 10 MG tablet Take 20 mg by mouth at bedtime.   Yes [provider]  apixaban (ELIQUIS) 2.5 MG TABS tablet Take 2.5 mg by mouth 2 (two) times daily.   Yes [provider]  azelastine (ASTELIN) 0.1 % nasal spray Place 1 spray into both nostrils 2 (two) times daily. Use in each nostril as directed   Yes [provider]  cholecalciferol (VITAMIN D3) 25 MCG (1000 UT) tablet Take 1,000 Units by mouth once a week.    Yes [provider]  diclofenac sodium (VOLTAREN) 1 % GEL Apply 2 g topically 3 (three) times daily.   Yes [provider]  folic acid (FOLVITE) 1 MG tablet Take 3 mg by mouth daily.  01/30/16  Yes [provider]  furosemide (LASIX) 20 MG tablet Take 10 mg by mouth 2 (two) times daily.    Yes [provider]  hydroxychloroquine (PLAQUENIL) 200 MG tablet Take 200 mg by mouth daily.  12/05/17  Yes [provider]  levothyroxine (SYNTHROID, LEVOTHROID) 50 MCG tablet Take 50 mcg by mouth daily before breakfast.   Yes [provider]  lidocaine (LIDODERM) 5 % Place 1 patch onto the skin daily. (remove after 12 hours or as directed 09/09/18  Yes [provider]  metoprolol succinate (TOPROL-XL) 12.5 mg TB24 24 hr tablet Take 12.5 mg by mouth daily.   Yes [provider]  polyvinyl alcohol (LIQUIFILM TEARS) 1.4 % ophthalmic solution Place 1 drop into both eyes as needed for dry eyes.   Yes [provider]  potassium chloride SA (K-DUR,KLOR-CON) 20 MEQ tablet Take 20 mEq by mouth daily.   Yes [provider]  predniSONE (DELTASONE) 5 MG tablet Take 5 mg by mouth 2 (two) times daily with a  meal.    Yes [provider]  tiZANidine (ZANAFLEX) 2 MG tablet Take 2 mg by mouth 2 (two) times daily as needed for muscle spasms. 09/07/18  Yes [provider]      PHYSICAL EXAMINATION:   VITAL SIGNS: Blood pressure 130/76, pulse (!) 102, temperature 97.6 F (36.4 C), temperature source Oral, resp. rate 17, height 5\' 2"  (1.575 m), weight 68.9 kg, SpO2 100 %.  GENERAL:  84 y.o.-year-old patient lying in the bed with moderate distress, secondary to nausea and abdominal pain.  EYES: Pupils equal, round, reactive to light and accommodation. No scleral icterus. Extraocular muscles intact.  HEENT: Head atraumatic, normocephalic. Oropharynx and nasopharynx clear.  NECK:  Supple, no jugular venous distention. No thyroid enlargement, no tenderness.  LUNGS: Normal breath sounds bilaterally, no wheezing, rales,rhonchi or crepitation. No use of accessory muscles of respiration.  CARDIOVASCULAR: S1, S2 normal. No S3/S4.  ABDOMEN:  There is left lower quadrant tenderness with palpation.  Abdomen is slightly distended.  Bowel sounds present. EXTREMITIES: No pedal edema, cyanosis, or clubbing.  NEUROLOGIC: No focal weakness. PSYCHIATRIC: The patient is alert and oriented x 3.  SKIN: No obvious rash, lesion, or ulcer.   LABORATORY PANEL:   CBC No results for input(s): WBC, HGB, HCT, PLT, MCV, MCH, MCHC, RDW, LYMPHSABS, MONOABS, EOSABS, BASOSABS, BANDABS in the last 168 hours.  Invalid input(s): NEUTRABS, BANDSABD ------------------------------------------------------------------------------------------------------------------  Chemistries  Recent Labs  Lab 09/30/18 1958  CREATININE 1.11*   ------------------------------------------------------------------------------------------------------------------ estimated creatinine clearance is 28.8 mL/min (A) (by C-G formula based on SCr of 1.11 mg/dL  (H)). ------------------------------------------------------------------------------------------------------------------ No results for input(s): TSH, T4TOTAL, T3FREE, THYROIDAB in the last 72 hours.  Invalid input(s): FREET3   Coagulation profile No results for input(s): INR, PROTIME in the last 168 hours. ------------------------------------------------------------------------------------------------------------------- No results for input(s): DDIMER in the last 72 hours. -------------------------------------------------------------------------------------------------------------------  Cardiac Enzymes Recent Labs  Lab 09/30/18 1511  TROPONINI 0.04*   ------------------------------------------------------------------------------------------------------------------ Invalid input(s): POCBNP  ---------------------------------------------------------------------------------------------------------------  Urinalysis    Component Value Date/Time   COLORURINE YELLOW (A) 06/08/2018 1245   APPEARANCEUR CLEAR (A) 06/08/2018 1245   APPEARANCEUR Clear 06/20/2013 0742   LABSPEC 1.010 06/08/2018 1245   LABSPEC 1.016 06/20/2013 0742   PHURINE 5.0 06/08/2018 1245   GLUCOSEU NEGATIVE 06/08/2018 1245   GLUCOSEU Negative 06/20/2013 0742   HGBUR SMALL (A) 06/08/2018 1245   BILIRUBINUR NEGATIVE 06/08/2018 1245   BILIRUBINUR Negative 06/20/2013 0742   KETONESUR NEGATIVE 06/08/2018 1245   PROTEINUR NEGATIVE 06/08/2018 1245   NITRITE POSITIVE (A) 06/08/2018 1245   LEUKOCYTESUR NEGATIVE 06/08/2018 1245   LEUKOCYTESUR Negative 06/20/2013 0742     RADIOLOGY: Dg Chest 2 View  Result Date: 09/30/2018 CLINICAL DATA:  Lower chest pain.  Chronic shortness of breath. EXAM: CHEST - 2 VIEW COMPARISON:  Chest radiograph October 29, 2017 FINDINGS: Cardiac silhouette is upper limits of normal, mediastinal silhouette is unremarkable for this low inspiratory examination with crowded vasculature markings.  Calcified aortic arch. The lungs are clear without pleural effusions or focal consolidations. Mild chronic interstitial changes. Mid and lower lung zone atelectasis/scarring. Trachea projects midline and there is no pneumothorax. Included soft tissue planes and osseous structures are non-suspicious. LEFT humerus intramedullary rod. Osteopenia. High-riding humeral heads seen with old rotator cuff injuries. IMPRESSION: Borderline cardiomegaly. Chronic interstitial changes with bilateral atelectasis/scarring. Aortic Atherosclerosis (ICD10-I70.0). Electronically Signed   By: Elon Alas M.D.   On: 09/30/2018 18:41   Ct Abdomen Pelvis W Contrast  Result Date: 09/30/2018 CLINICAL DATA:  Left upper quadrant abdominal pain starting on Sunday. Nausea and vomiting. EXAM: CT ABDOMEN AND PELVIS WITH CONTRAST TECHNIQUE: Multidetector CT imaging of the abdomen and pelvis was performed using the standard protocol following bolus administration of intravenous contrast. CONTRAST:  18mL ISOVUE-300 IOPAMIDOL (ISOVUE-300) INJECTION 61% COMPARISON:  Multiple exams, including 07/24/2015 FINDINGS: Lower chest: Multiple new bibasilar pulmonary nodules although some of the previous nodules on the 12/24/2016 exam have resolved, and some are stable. A posterior basal segment right lower lobe nodule measuring 2.1 by 1.4 cm on image 12/4 is new today. Another right lower lobe nodule measuring 1.8 by 0.9 cm on image 6/4 is new. A third right lower lobe pulmonary nodule with slightly irregular margins measuring 1.2 by 0.8 cm on image 4/4 is new. Peripheral nodules in the right middle lobe on image 3/4 are stable. In the left lower lobe, there is a new 1.2 by 1.0 cm nodule on image 8/4.  There is also some pleural-based nodularity anteriorly in the left lower lobe which is stable. Other smaller pulmonary nodules are present in the lung bases. Calcification of the mitral valve and of the wall of the descending thoracic aorta.  Hepatobiliary: Multiple new hypodense hepatic lesions are observed. These include a 2.0 by 1.8 cm lesion in segment 5 on image 23/2 and has some peripheral enhancement; a 1.7 by 1.3 cm hypoenhancing lesion posteriorly in the right hepatic lobe on image 19/2 which becomes less conspicuous on the delayed images; and a 1.8 by 1.3 cm posteromedial right hepatic lobe lesion on image 22/2, likewise with some reduced conspicuity on the delayed images. The gallbladder appears unremarkable. No biliary dilatation. Pancreas: Unremarkable Spleen: The spleen measures 14.7 by 5.5 by 14.9 cm and contains numerous hypoenhancing scattered lesions throughout the spleen as well as some geographic abnormal hypodensity in the posterolateral spleen which could be from infarct or tumor. Adrenals/Urinary Tract: The adrenal glands appear unremarkable. Two benign left renal cysts are present. Pelvic floor laxity with cystocele. Several small additional hypodense lesions in the kidneys are statistically likely to be small cysts but technically nonspecific. Stomach/Bowel: Large proximal duodenal diverticulum. 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 Somewhat diffuse colonic diverticulosis most severe in the sigmoid colon; a rim enhancing 2.3 by 2.0 cm presumed diverticulum along the margin of the sigmoid colon is probably inflamed compatible with acute diverticulitis. This appears to communicate with the bowel and accordingly I do not believe it to be an abscess. There is an anastomotic staple line near the anus and low position of the anorectal junction due to the considerable pelvic floor laxity. Vascular/Lymphatic: Aortoiliac atherosclerotic vascular disease. 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 Atherosclerotic narrowing of the superior mesenteric artery. No overt pathologic adenopathy. Reproductive: Uterus absent.  Adnexa unremarkable. Other: No supplemental non-categorized findings.  Musculoskeletal: Prominent pelvic floor laxity as noted above. Grade 1 degenerative anterolisthesis at L3-4. Lumbar spondylosis and degenerative disc disease cause impingement at L3-4, L4-5, and L5-S1. IMPRESSION: 1. Moderate but focal acute sigmoid colon diverticulitis with diffuse colonic diverticulosis. 2. Multiple new bibasilar pulmonary nodules measuring up to 2.1 cm in long axis. Although the patient does seem to have a history of waxing and waning pulmonary nodules which could be from atypical infectious process, there are new hypoenhancing lesions in the liver and in the spleen along with splenomegaly and likely a splenic infarct. This raises concern for lymphoproliferative malignancy or other malignancy with metastatic disease to the liver and spleen. I recommend either nuclear medicine PET-CT, or further scanning with dedicated CT chest to provide complementary information. Sarcoidosis could also cause pulmonary, hepatic, and splenic lesions. 3. Considerable pelvic floor laxity with cystocele and very low position of the anorectal junction, where there are postoperative findings. 4. Other imaging findings of potential clinical significance: Aortic Atherosclerosis (ICD10-I70.0). 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 Multilevel lower lumbar impingement. Benign-appearing left renal cysts. Calcification of the mitral valve. Electronically Signed   By: Van Clines M.D.   On: 09/30/2018 17:19   US Venous Img Lower Bilateral  Result Date: 09/30/2018 CLINICAL DATA:  Dyspnea.  Prior LEFT lower extremity DVT EXAM: BILATERAL LOWER EXTREMITY VENOUS DOPPLER ULTRASOUND TECHNIQUE: Gray-scale sonography with graded compression, as well as color Doppler and duplex ultrasound were performed to evaluate the lower extremity deep venous systems from the level of the common femoral vein and including the common femoral, femoral, profunda femoral, popliteal and calf  veins including the posterior  tibial, peroneal and gastrocnemius veins when visible. The superficial great saphenous vein was also interrogated. Spectral Doppler was utilized to evaluate flow at rest and with distal augmentation maneuvers in the common femoral, femoral and popliteal veins. COMPARISON:  12/25/2016 FINDINGS: RIGHT LOWER EXTREMITY Common Femoral Vein: No evidence of thrombus. Normal compressibility, respiratory phasicity and response to augmentation. Saphenofemoral Junction: No evidence of thrombus. Normal compressibility and flow on color Doppler imaging. Profunda Femoral Vein: No evidence of thrombus. Normal compressibility and flow on color Doppler imaging. Femoral Vein: No evidence of thrombus. Normal compressibility, respiratory phasicity and response to augmentation. Popliteal Vein: No evidence of thrombus. Normal compressibility, respiratory phasicity and response to augmentation. Calf Veins: No evidence of thrombus. Normal compressibility and flow on color Doppler imaging. Superficial Great Saphenous Vein: No evidence of thrombus. Normal compressibility. Venous Reflux:  None. Other Findings:  None. LEFT LOWER EXTREMITY Common Femoral Vein: No evidence of thrombus. Normal compressibility, respiratory phasicity and response to augmentation. Saphenofemoral Junction: No evidence of thrombus. Normal compressibility and flow on color Doppler imaging. Profunda Femoral Vein: No evidence of thrombus. Normal compressibility and flow on color Doppler imaging. Femoral Vein: No evidence of thrombus. Normal compressibility, respiratory phasicity and response to augmentation. Popliteal Vein: No evidence of thrombus. Normal compressibility, respiratory phasicity and response to augmentation. Calf Veins: No evidence of thrombus. Normal compressibility and flow on color Doppler imaging. Superficial Great Saphenous Vein: No evidence of thrombus. Normal compressibility. Venous Reflux:  None. Other Findings:  None. IMPRESSION: No evidence of  RIGHT or LEFT deep venous thrombosis. Electronically Signed   By: Staci Righter M.D.   On: 09/30/2018 19:31    EKG: Orders placed or performed in visit on 09/30/18  . EKG 12-Lead    IMPRESSION AND PLAN:  1.  Acute diverticulitis, will start patient on Zosyn IV and IV fluids.  Due to recent course of antibiotics and low immunity, patient is a high risk for C. difficile colitis.  Will check stool for C. Difficile. 2.  Borderline elevated troponin level, likely related to demand ischemia from acute illness.  We will monitor patient on telemetry and follow troponin levels to rule out ACS.  Will check 2D echo to further evaluate her cardiac function. 3.  Acute renal failure, likely prerenal, secondary to dehydration from vomiting and diarrhea.  We will start gentle IV hydration and monitor kidney function closely.  Avoid nephrotoxic medications. 4.  History of atrial flutter, PE and DVT.  Continue anticoagulation with with Eliquis. 5.  Hypertension, stable, resume home medications. 6.  Sjogren's disease, stable, continue maintenance therapy. 7.  Pulmonary nodules, liver and spleen lesions per CAT scan report.  This findings were discussed with patient and the family.  Patient would like to follow-up with her hematology oncology specialist as outpatient to discuss further management, as she would like to avoid any aggressive procedures.  All the records are reviewed and case discussed with ED provider. Management plans discussed with the patient, family and they are in agreement.  CODE STATUS: Full Code Status History    Date Active Date Inactive Code Status Order ID Comments User Context   06/08/2018 0431 06/10/2018 1523 Full Code [id number]  Harrie Foreman, MD Inpatient   10/29/2017 1616 10/30/2017 1653 Full Code [id number]  Loletha Grayer, MD ED   12/24/2016 2056 12/26/2016 1817 Full Code [id number]  Lance Coon, MD Inpatient       TOTAL TIME TAKING CARE OF THIS PATIENT: 50 minutes.  Amelia Jo M.D on 09/30/2018 at 9:35 PM  Between 7am to 6pm - Pager - [phone]  After 6pm go to [email or web address] - password [email or web address] [facility] [place] [facility]  [phone]  CC: Primary care physician; Adin Hector, MD

## 2018-09-30 NOTE — ED Provider Notes (Signed)
[facility] Emergency Department Provider Note   ____________________________________________   None    (approximate)  I have reviewed the triage vital signs and the nursing notes.   HISTORY  Chief Complaint Abdominal Pain and Chest Pain    HPI Carol Bryan is a 84 y.o. female presents for evaluation for left-sided abdominal pain but also concern for an elevated troponin at clinic   Referred from clinic today for concerns of diarrhea, elevated troponin after an episode of substernal chest pain earlier today as well.  Patient reports for about 5 to 6 days now she is been expensing pain in her left side abdomen having loose stools up to 3 daily, nonbloody.  No fever or chills.  Some fatigue and decreased appetite, vomited twice this morning.  She also reported after vomiting she felt a little bit of a discomfort of the left side of her chest a light pressure feeling.  All pain and discomfort of gone away at this time.  She does report when she pushed on her stomach though she is having some tenderness in the left side still.  Known history of pulmonary embolism, anticoagulated and she reports compliance including taking her anticoagulant dose this morning  No shortness of breath at present, but is had some chronic shortness of breath since her pulmonary embolism    Past Medical History:  Diagnosis Date  . Atrial flutter ([place])    cardioverted  . Collagen vascular disease (HCC)    RA  . DVT (deep venous thrombosis) ([place])   . HLD (hyperlipidemia)   . Hypertension   . Hypothyroid   . Pulmonary embolism ([place])   . RA (rheumatoid arthritis) ([place])   . Sjogren's disease [facility])     Patient Active Problem List   Diagnosis Date Noted  . Acute-on-chronic kidney injury ([place]) 06/08/2018  . SVT (supraventricular tachycardia) ([place]) 10/29/2017  . Other pancytopenia ([place]) 09/24/2017  . Arthropathy 08/20/2017  . Diverticulosis 08/20/2017  . Edema 08/20/2017    . Heart murmur 08/20/2017  . Osteoporosis, postmenopausal 08/20/2017  . Pleural thickening 08/20/2017  . Rheumatoid arthritis involving multiple sites with positive rheumatoid factor ([place]) 08/20/2017  . Sjogren's syndrome ([place]) 08/20/2017  . Vitamin D deficiency, unspecified 08/20/2017  . Acute deep vein thrombosis (DVT) of popliteal vein of left lower extremity ([place]) 01/14/2017  . Pulmonary embolism ([place]) 12/24/2016  . Essential hypertension 12/24/2016  . Hyperlipidemia, unspecified 12/24/2016  . Hypothyroidism 12/24/2016  . Rheumatoid arteritis ([place]) 12/24/2016  . History of pulmonary embolism 12/24/2016  . Senile purpura ([place]) 08/19/2016  . Aortic atherosclerosis ([place]) 08/08/2016  . B12 deficiency 07/23/2016  . Hypokalemia 07/23/2016  . Neutropenia ([place]) 01/23/2016  . Abnormal positron emission tomography (PET) scan 11/01/2015  . Incidental lung nodule 07/27/2015  . CKD (chronic kidney disease) stage 3, GFR 30-59 ml/min (HCC) 07/19/2015  . Splenic artery aneurysm ([place]) 07/19/2015  . Osteoarthritis 07/20/2014  . Hypothyroidism due to acquired atrophy of thyroid 03/06/2012  . Peptic ulcer disease 03/06/2012  . S/P PDA repair 03/06/2012  . Encounter for long-term (current) use of medications 10/29/2011  . Carpal tunnel syndrome of right wrist 10/28/2011  . Cervical spondylosis 10/28/2011    Past Surgical History:  Procedure Laterality Date  . ABDOMINAL HYSTERECTOMY    . APPENDECTOMY    . ASD REPAIR  age 32  . cataracts    . JOINT REPLACEMENT Left    elbow  . PATENT DUCTUS ARTERIOUS REPAIR      Prior to  Admission medications   Medication Sig Start Date End Date Taking? Authorizing Provider  amitriptyline (ELAVIL) 10 MG tablet Take 20 mg by mouth at bedtime.    [provider]  apixaban (ELIQUIS) 2.5 MG TABS tablet Take 2.5 mg by mouth 2 (two) times daily.    [provider]  Cyanocobalamin (VITAMIN B 12 PO) Take 1,000 mcg by mouth daily.    [provider]  diphenhydramine-acetaminophen (TYLENOL PM) 25-500 MG TABS tablet Take 1 tablet by mouth at bedtime as needed.    [provider]  folic acid (FOLVITE) 1 MG tablet Take 3 mg by mouth daily.  01/30/16   [provider]  furosemide (LASIX) 20 MG tablet Take 20 mg by mouth daily.    [provider]  hydroxychloroquine (PLAQUENIL) 200 MG tablet TAKE 1 TABLET DAILY 12/05/17   [provider]  levothyroxine (SYNTHROID, LEVOTHROID) 50 MCG tablet Take 50 mcg by mouth daily before breakfast.    [provider]  metoprolol succinate (TOPROL-XL) 12.5 mg TB24 24 hr tablet Take 12.5 mg by mouth daily.    [provider]  polyvinyl alcohol (LIQUIFILM TEARS) 1.4 % ophthalmic solution Place 1 drop into both eyes as needed for dry eyes.    [provider]  potassium chloride SA (K-DUR,KLOR-CON) 20 MEQ tablet Take 20 mEq by mouth daily.    [provider]  predniSONE (DELTASONE) 5 MG tablet Take 5 mg by mouth 2 (two) times daily with a meal.     [provider]  Vitamin D, Ergocalciferol, (DRISDOL) 50000 units CAPS capsule Take 1,000 Units by mouth daily.  10/03/16   [provider]    Allergies Ciprofloxacin; Methotrexate; Gold-containing drug products; Other; and Sulfa antibiotics  Family History  Problem Relation Age of Onset  . Cancer Brother   . Stroke Mother   . Dementia Mother   . Kidney cancer Father   . Stroke Sister   . Cancer Sister   . Lung cancer Sister   . Liver cancer Sister     Social History Social History   Tobacco Use  . Smoking status: Never Smoker  . Smokeless tobacco: Never Used  Substance Use Topics  . Alcohol use: Yes    Comment: occasionally   . Drug use: No    Review of Systems Constitutional: No fever/chills Eyes: No visual changes. ENT: No sore throat. Cardiovascular: See HPI  Respiratory: See HPI Gastrointestinal: See HPI Genitourinary: Negative for  dysuria. Musculoskeletal: Negative for back pain. Skin: Negative for rash. Neurological: Negative for headaches, areas of focal weakness or numbness.    ____________________________________________   PHYSICAL EXAM:  VITAL SIGNS: ED Triage Vitals  Enc Vitals Group     BP 09/30/18 1714 131/76     Pulse Rate 09/30/18 1714 (!) 107     Resp 09/30/18 1714 16     Temp 09/30/18 1714 97.6 F (36.4 C)     Temp Source 09/30/18 1714 Oral     SpO2 09/30/18 1714 96 %     Weight 09/30/18 1715 152 lb (68.9 kg)     Height 09/30/18 1715 5\' 2"  (1.575 m)     Head Circumference --      Peak Flow --      Pain Score 09/30/18 1715 0     Pain Loc --      Pain Edu? --      Excl. in [place]? --     Constitutional: Alert and oriented. Well appearing and  in no acute distress.  Very pleasant. Eyes: Conjunctivae are normal. Head: Atraumatic. Nose: No congestion/rhinnorhea. Mouth/Throat: Mucous membranes are somewhat dry Neck: No stridor.  Cardiovascular: Normal rate, regular rhythm. Grossly normal heart sounds.  Good peripheral circulation. Respiratory: Normal respiratory effort.  No retractions. Lungs CTAB. Gastrointestinal: Soft and nontender except for some mild peritonitis over the left upper quadrant and left flank.  Some slight rebound tenderness.  No guarding. No distention. Musculoskeletal: No lower extremity tenderness 1+ lower extremity edema which patient reports actually better than normal. Neurologic:  Normal speech and language. No gross focal neurologic deficits are appreciated.  Skin:  Skin is warm, dry and intact. No rash noted. Psychiatric: Mood and affect are normal. Speech and behavior are normal.  ____________________________________________   LABS (all labs ordered are listed, but only abnormal results are displayed)  Labs Reviewed  CBG MONITORING, ED   ____________________________________________  EKG  Reviewed enterotomy at 1730 Heart rate 110 QRS 80 QTc 440 Sinus  tachycardia, no evidence of acute ischemia denoted ____________________________________________  RADIOLOGY  Dg Chest 2 View  Result Date: 09/30/2018 CLINICAL DATA:  Lower chest pain.  Chronic shortness of breath. EXAM: CHEST - 2 VIEW COMPARISON:  Chest radiograph October 29, 2017 FINDINGS: Cardiac silhouette is upper limits of normal, mediastinal silhouette is unremarkable for this low inspiratory examination with crowded vasculature markings. Calcified aortic arch. The lungs are clear without pleural effusions or focal consolidations. Mild chronic interstitial changes. Mid and lower lung zone atelectasis/scarring. Trachea projects midline and there is no pneumothorax. Included soft tissue planes and osseous structures are non-suspicious. LEFT humerus intramedullary rod. Osteopenia. High-riding humeral heads seen with old rotator cuff injuries. IMPRESSION: Borderline cardiomegaly. Chronic interstitial changes with bilateral atelectasis/scarring. Aortic Atherosclerosis (ICD10-I70.0). Electronically Signed   By: Elon Alas M.D.   On: 09/30/2018 18:41   Ct Abdomen Pelvis W Contrast  Result Date: 09/30/2018 CLINICAL DATA:  Left upper quadrant abdominal pain starting on Sunday. Nausea and vomiting. EXAM: CT ABDOMEN AND PELVIS WITH CONTRAST TECHNIQUE: Multidetector CT imaging of the abdomen and pelvis was performed using the standard protocol following bolus administration of intravenous contrast. CONTRAST:  140mL ISOVUE-300 IOPAMIDOL (ISOVUE-300) INJECTION 61% COMPARISON:  Multiple exams, including 07/24/2015 FINDINGS: Lower chest: Multiple new bibasilar pulmonary nodules although some of the previous nodules on the 12/24/2016 exam have resolved, and some are stable. A posterior basal segment right lower lobe nodule measuring 2.1 by 1.4 cm on image 12/4 is new today. Another right lower lobe nodule measuring 1.8 by 0.9 cm on image 6/4 is new. A third right lower lobe pulmonary nodule with slightly  irregular margins measuring 1.2 by 0.8 cm on image 4/4 is new. Peripheral nodules in the right middle lobe on image 3/4 are stable. In the left lower lobe, there is a new 1.2 by 1.0 cm nodule on image 8/4. There is also some pleural-based nodularity anteriorly in the left lower lobe which is stable. Other smaller pulmonary nodules are present in the lung bases. Calcification of the mitral valve and of the wall of the descending thoracic aorta. Hepatobiliary: Multiple new hypodense hepatic lesions are observed. These include a 2.0 by 1.8 cm lesion in segment 5 on image 23/2 and has some peripheral enhancement; a 1.7 by 1.3 cm hypoenhancing lesion posteriorly in the right hepatic lobe on image 19/2 which becomes less conspicuous on the delayed images; and a 1.8 by 1.3 cm posteromedial right hepatic lobe lesion on image 22/2, likewise with some reduced conspicuity on the delayed  images. The gallbladder appears unremarkable. No biliary dilatation. Pancreas: Unremarkable Spleen: The spleen measures 14.7 by 5.5 by 14.9 cm and contains numerous hypoenhancing scattered lesions throughout the spleen as well as some geographic abnormal hypodensity in the posterolateral spleen which could be from infarct or tumor. Adrenals/Urinary Tract: The adrenal glands appear unremarkable. Two benign left renal cysts are present. Pelvic floor laxity with cystocele. Several small additional hypodense lesions in the kidneys are statistically likely to be small cysts but technically nonspecific. Stomach/Bowel: Large proximal duodenal diverticulum. 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 Somewhat diffuse colonic diverticulosis most severe in the sigmoid colon; a rim enhancing 2.3 by 2.0 cm presumed diverticulum along the margin of the sigmoid colon is probably inflamed compatible with acute diverticulitis. This appears to communicate with the bowel and accordingly I do not believe it to be an abscess. There is  an anastomotic staple line near the anus and low position of the anorectal junction due to the considerable pelvic floor laxity. Vascular/Lymphatic: Aortoiliac atherosclerotic vascular disease. 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 Atherosclerotic narrowing of the superior mesenteric artery. No overt pathologic adenopathy. Reproductive: Uterus absent.  Adnexa unremarkable. Other: No supplemental non-categorized findings. Musculoskeletal: Prominent pelvic floor laxity as noted above. Grade 1 degenerative anterolisthesis at L3-4. Lumbar spondylosis and degenerative disc disease cause impingement at L3-4, L4-5, and L5-S1. IMPRESSION: 1. Moderate but focal acute sigmoid colon diverticulitis with diffuse colonic diverticulosis. 2. Multiple new bibasilar pulmonary nodules measuring up to 2.1 cm in long axis. Although the patient does seem to have a history of waxing and waning pulmonary nodules which could be from atypical infectious process, there are new hypoenhancing lesions in the liver and in the spleen along with splenomegaly and likely a splenic infarct. This raises concern for lymphoproliferative malignancy or other malignancy with metastatic disease to the liver and spleen. I recommend either nuclear medicine PET-CT, or further scanning with dedicated CT chest to provide complementary information. Sarcoidosis could also cause pulmonary, hepatic, and splenic lesions. 3. Considerable pelvic floor laxity with cystocele and very low position of the anorectal junction, where there are postoperative findings. 4. Other imaging findings of potential clinical significance: Aortic Atherosclerosis (ICD10-I70.0). 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 Multilevel lower lumbar impingement. Benign-appearing left renal cysts. Calcification of the mitral valve. Electronically Signed   By: Van Clines M.D.   On: 09/30/2018 17:19   CT report included as this was performed at clinic, reviewed by  me.  Multiple findings. ____________________________________________   PROCEDURES  Procedure(s) performed: None  Procedures  Critical Care performed: No  ____________________________________________   INITIAL IMPRESSION / ASSESSMENT AND PLAN / ED COURSE  Pertinent labs & imaging results that were available during my care of the patient were reviewed by me and considered in my medical decision making (see chart for details).   Patient returns for evaluation of diarrhea and left-sided abdominal pain, CT concerning for diverticulitis but also concern for possible myeloproliferative disease and all other findings such as a splenic infarct.  EKG no evidence of acute ischemia, currently pain-free.  Her troponin is minimally elevated and actually less so than has been in the past.  Lab work reviewed from the clinic which was done today.  We will check chest x-ray to further evaluate for additional nodules, ultrasound to evaluate for any evidence of an acute DVT though at this point I suspect her pain is likely referred discomfort secondary diverticulitis.  She is currently  anticoagulated lowering her risk for acute pulmonary embolism and DVT significantly.  Resting comfortably, given her age, tachycardia and ongoing symptomatology with slight peritonitis on exam will admit and initiate broad-spectrum antibiotics.      ____________________________________________   FINAL CLINICAL IMPRESSION(S) / ED DIAGNOSES  Final diagnoses:  Splenic infarct  Diverticulitis        Note:  This document was prepared using Dragon voice recognition software and may include unintentional dictation errors       Delman Kitten, MD 09/30/18 1902

## 2018-09-30 NOTE — ED Notes (Signed)
Patient transported to X-ray 

## 2018-10-01 ENCOUNTER — Inpatient Hospital Stay
Admit: 2018-10-01 | Discharge: 2018-10-01 | Disposition: A | Discharge status: Home / Self Care | Payer: Medicare Other | Attending: Internal Medicine | Admitting: Internal Medicine

## 2018-10-01 LAB — BASIC METABOLIC PANEL
Anion gap: 10 (ref 5–15)
BUN: 19 mg/dL (ref 8–23)
CO2: 24 mmol/L (ref 22–32)
CREATININE: 1.11 mg/dL — AB (ref 0.44–1.00)
Calcium: 7.6 mg/dL — ABNORMAL LOW (ref 8.9–10.3)
Chloride: 104 mmol/L (ref 98–111)
GFR calc Af Amer: 48 mL/min — ABNORMAL LOW (ref >60)
GFR, EST NON AFRICAN AMERICAN: 41 mL/min — AB (ref >60)
Glucose, Bld: 87 mg/dL (ref 70–99)
Potassium: 3.9 mmol/L (ref 3.5–5.1)
SODIUM: 138 mmol/L (ref 135–145)

## 2018-10-01 LAB — TROPONIN I: TROPONIN I: 0.03 ng/mL — AB (ref <0.03)

## 2018-10-01 LAB — GLUCOSE, CAPILLARY: Glucose-Capillary: 84 mg/dL (ref 70–99)

## 2018-10-01 LAB — CBC
HCT: 30.3 % — ABNORMAL LOW (ref 36.0–46.0)
Hemoglobin: 9.3 g/dL — ABNORMAL LOW (ref 12.0–15.0)
MCH: 28 pg (ref 26.0–34.0)
MCHC: 30.7 g/dL (ref 30.0–36.0)
MCV: 91.3 fL (ref 80.0–100.0)
PLATELETS: 152 10*3/uL (ref 150–400)
RBC: 3.32 MIL/uL — ABNORMAL LOW (ref 3.87–5.11)
RDW: 15.9 % — AB (ref 11.5–15.5)
WBC: 2.6 10*3/uL — AB (ref 4.0–10.5)
nRBC: 0.8 % — ABNORMAL HIGH (ref 0.0–0.2)

## 2018-10-01 NOTE — Progress Notes (Signed)
[facility] [place] NAME: Carol Bryan    MR#:  [id number]  DATE OF BIRTH:  May 26, 1923  SUBJECTIVE:  CHIEF COMPLAINT:   Chief Complaint  Patient presents with  . Abdominal Pain  . Chest Pain   The patient has better abdominal pain, no nausea, vomiting or bowel movement. REVIEW OF SYSTEMS:  Review of Systems  Constitutional: Negative for chills, fever and malaise/fatigue.  HENT: Negative for sore throat.   Eyes: Negative for blurred vision and double vision.  Respiratory: Negative for cough, hemoptysis, shortness of breath, wheezing and stridor.   Cardiovascular: Negative for chest pain, palpitations, orthopnea and leg swelling.  Gastrointestinal: Positive for abdominal pain. Negative for blood in stool, diarrhea, melena, nausea and vomiting.  Genitourinary: Negative for dysuria, flank pain and hematuria.  Musculoskeletal: Negative for back pain and joint pain.  Skin: Negative for rash.  Neurological: Negative for dizziness, sensory change, focal weakness, seizures, loss of consciousness, weakness and headaches.  Endo/Heme/Allergies: Negative for polydipsia.  Psychiatric/Behavioral: Negative for depression. The patient is not nervous/anxious.     DRUG ALLERGIES:   Allergies  Allergen Reactions  . Ciprofloxacin Other (See Comments)    Foot burning; tolerates levaquin  . Methotrexate Other (See Comments)    leukopenia  . Gold-Containing Drug Products Rash  . Other Other (See Comments) and Rash  . Sulfa Antibiotics Rash   VITALS:  Blood pressure 133/81, pulse (!) 106, temperature 99.4 F (37.4 C), temperature source Oral, resp. rate 17, height 5\' 2"  (1.575 m), weight 70.1 kg, SpO2 97 %. PHYSICAL EXAMINATION:  Physical Exam  Constitutional: She is oriented to person, place, and time. She appears well-developed. No distress.  HENT:  Head: Normocephalic.  Mouth/Throat: Oropharynx is clear and moist.  Eyes: Pupils are equal,  round, and reactive to light. Conjunctivae and EOM are normal. No scleral icterus.  Neck: Normal range of motion. Neck supple. No JVD present. No tracheal deviation present.  Cardiovascular: Normal rate, regular rhythm and normal heart sounds. Exam reveals no gallop.  No murmur heard. Pulmonary/Chest: Effort normal and breath sounds normal. No respiratory distress. She has no wheezes. She has no rales.  Abdominal: Soft. Bowel sounds are normal. She exhibits no distension. There is tenderness. There is no rebound.  Tenderness on left lower quadrant  Musculoskeletal: Normal range of motion. She exhibits no edema or tenderness.  Neurological: She is alert and oriented to person, place, and time. No cranial nerve deficit.  Skin: No rash noted. No erythema.  Psychiatric: She has a normal mood and affect.   LABORATORY PANEL:  Female CBC Recent Labs  Lab 10/01/18 0428  WBC 2.6*  HGB 9.3*  HCT 30.3*  PLT 152   ------------------------------------------------------------------------------------------------------------------ Chemistries  Recent Labs  Lab 10/01/18 0428  NA 138  K 3.9  CL 104  CO2 24  GLUCOSE 87  BUN 19  CREATININE 1.11*  CALCIUM 7.6*   RADIOLOGY:  Dg Chest 2 View  Result Date: 09/30/2018 CLINICAL DATA:  Lower chest pain.  Chronic shortness of breath. EXAM: CHEST - 2 VIEW COMPARISON:  Chest radiograph October 29, 2017 FINDINGS: Cardiac silhouette is upper limits of normal, mediastinal silhouette is unremarkable for this low inspiratory examination with crowded vasculature markings. Calcified aortic arch. The lungs are clear without pleural effusions or focal consolidations. Mild chronic interstitial changes. Mid and lower lung zone atelectasis/scarring. Trachea projects midline and there is no pneumothorax. Included soft tissue planes and osseous structures are non-suspicious. LEFT  humerus intramedullary rod. Osteopenia. High-riding humeral heads seen with old rotator cuff  injuries. IMPRESSION: Borderline cardiomegaly. Chronic interstitial changes with bilateral atelectasis/scarring. Aortic Atherosclerosis (ICD10-I70.0). Electronically Signed   By: Elon Alas M.D.   On: 09/30/2018 18:41   Ct Abdomen Pelvis W Contrast  Result Date: 09/30/2018 CLINICAL DATA:  Left upper quadrant abdominal pain starting on Sunday. Nausea and vomiting. EXAM: CT ABDOMEN AND PELVIS WITH CONTRAST TECHNIQUE: Multidetector CT imaging of the abdomen and pelvis was performed using the standard protocol following bolus administration of intravenous contrast. CONTRAST:  176mL ISOVUE-300 IOPAMIDOL (ISOVUE-300) INJECTION 61% COMPARISON:  Multiple exams, including 07/24/2015 FINDINGS: Lower chest: Multiple new bibasilar pulmonary nodules although some of the previous nodules on the 12/24/2016 exam have resolved, and some are stable. A posterior basal segment right lower lobe nodule measuring 2.1 by 1.4 cm on image 12/4 is new today. Another right lower lobe nodule measuring 1.8 by 0.9 cm on image 6/4 is new. A third right lower lobe pulmonary nodule with slightly irregular margins measuring 1.2 by 0.8 cm on image 4/4 is new. Peripheral nodules in the right middle lobe on image 3/4 are stable. In the left lower lobe, there is a new 1.2 by 1.0 cm nodule on image 8/4. There is also some pleural-based nodularity anteriorly in the left lower lobe which is stable. Other smaller pulmonary nodules are present in the lung bases. Calcification of the mitral valve and of the wall of the descending thoracic aorta. Hepatobiliary: Multiple new hypodense hepatic lesions are observed. These include a 2.0 by 1.8 cm lesion in segment 5 on image 23/2 and has some peripheral enhancement; a 1.7 by 1.3 cm hypoenhancing lesion posteriorly in the right hepatic lobe on image 19/2 which becomes less conspicuous on the delayed images; and a 1.8 by 1.3 cm posteromedial right hepatic lobe lesion on image 22/2, likewise with some  reduced conspicuity on the delayed images. The gallbladder appears unremarkable. No biliary dilatation. Pancreas: Unremarkable Spleen: The spleen measures 14.7 by 5.5 by 14.9 cm and contains numerous hypoenhancing scattered lesions throughout the spleen as well as some geographic abnormal hypodensity in the posterolateral spleen which could be from infarct or tumor. Adrenals/Urinary Tract: The adrenal glands appear unremarkable. Two benign left renal cysts are present. Pelvic floor laxity with cystocele. Several small additional hypodense lesions in the kidneys are statistically likely to be small cysts but technically nonspecific. Stomach/Bowel: Large proximal duodenal diverticulum. 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 Somewhat diffuse colonic diverticulosis most severe in the sigmoid colon; a rim enhancing 2.3 by 2.0 cm presumed diverticulum along the margin of the sigmoid colon is probably inflamed compatible with acute diverticulitis. This appears to communicate with the bowel and accordingly I do not believe it to be an abscess. There is an anastomotic staple line near the anus and low position of the anorectal junction due to the considerable pelvic floor laxity. Vascular/Lymphatic: Aortoiliac atherosclerotic vascular disease. 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 Atherosclerotic narrowing of the superior mesenteric artery. No overt pathologic adenopathy. Reproductive: Uterus absent.  Adnexa unremarkable. Other: No supplemental non-categorized findings. Musculoskeletal: Prominent pelvic floor laxity as noted above. Grade 1 degenerative anterolisthesis at L3-4. Lumbar spondylosis and degenerative disc disease cause impingement at L3-4, L4-5, and L5-S1. IMPRESSION: 1. Moderate but focal acute sigmoid colon diverticulitis with diffuse colonic diverticulosis. 2. Multiple new bibasilar pulmonary nodules measuring up to 2.1 cm in long axis. Although the patient does seem  to have  a history of waxing and waning pulmonary nodules which could be from atypical infectious process, there are new hypoenhancing lesions in the liver and in the spleen along with splenomegaly and likely a splenic infarct. This raises concern for lymphoproliferative malignancy or other malignancy with metastatic disease to the liver and spleen. I recommend either nuclear medicine PET-CT, or further scanning with dedicated CT chest to provide complementary information. Sarcoidosis could also cause pulmonary, hepatic, and splenic lesions. 3. Considerable pelvic floor laxity with cystocele and very low position of the anorectal junction, where there are postoperative findings. 4. Other imaging findings of potential clinical significance: Aortic Atherosclerosis (ICD10-I70.0). 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 Multilevel lower lumbar impingement. Benign-appearing left renal cysts. Calcification of the mitral valve. Electronically Signed   By: Van Clines M.D.   On: 09/30/2018 17:19   US Venous Img Lower Bilateral  Result Date: 09/30/2018 CLINICAL DATA:  Dyspnea.  Prior LEFT lower extremity DVT EXAM: BILATERAL LOWER EXTREMITY VENOUS DOPPLER ULTRASOUND TECHNIQUE: Gray-scale sonography with graded compression, as well as color Doppler and duplex ultrasound were performed to evaluate the lower extremity deep venous systems from the level of the common femoral vein and including the common femoral, femoral, profunda femoral, popliteal and calf veins including the posterior tibial, peroneal and gastrocnemius veins when visible. The superficial great saphenous vein was also interrogated. Spectral Doppler was utilized to evaluate flow at rest and with distal augmentation maneuvers in the common femoral, femoral and popliteal veins. COMPARISON:  12/25/2016 FINDINGS: RIGHT LOWER EXTREMITY Common Femoral Vein: No evidence of thrombus. Normal compressibility, respiratory phasicity and response  to augmentation. Saphenofemoral Junction: No evidence of thrombus. Normal compressibility and flow on color Doppler imaging. Profunda Femoral Vein: No evidence of thrombus. Normal compressibility and flow on color Doppler imaging. Femoral Vein: No evidence of thrombus. Normal compressibility, respiratory phasicity and response to augmentation. Popliteal Vein: No evidence of thrombus. Normal compressibility, respiratory phasicity and response to augmentation. Calf Veins: No evidence of thrombus. Normal compressibility and flow on color Doppler imaging. Superficial Great Saphenous Vein: No evidence of thrombus. Normal compressibility. Venous Reflux:  None. Other Findings:  None. LEFT LOWER EXTREMITY Common Femoral Vein: No evidence of thrombus. Normal compressibility, respiratory phasicity and response to augmentation. Saphenofemoral Junction: No evidence of thrombus. Normal compressibility and flow on color Doppler imaging. Profunda Femoral Vein: No evidence of thrombus. Normal compressibility and flow on color Doppler imaging. Femoral Vein: No evidence of thrombus. Normal compressibility, respiratory phasicity and response to augmentation. Popliteal Vein: No evidence of thrombus. Normal compressibility, respiratory phasicity and response to augmentation. Calf Veins: No evidence of thrombus. Normal compressibility and flow on color Doppler imaging. Superficial Great Saphenous Vein: No evidence of thrombus. Normal compressibility. Venous Reflux:  None. Other Findings:  None. IMPRESSION: No evidence of RIGHT or LEFT deep venous thrombosis. Electronically Signed   By: Staci Righter M.D.   On: 09/30/2018 19:31   ASSESSMENT AND PLAN:   1.  Acute comorbid diverticulitis,  Continue Zosyn IV and IV fluids.  Due to recent course of antibiotics and low immunity, patient is a high risk for C. difficile colitis.   No stool for C. Difficile test.  2.  Borderline elevated troponin level, likely related to demand ischemia  from acute illness. No ACS.    Follow-up 2D.  3.  Acute renal failure, likely prerenal, secondary to dehydration from vomiting and diarrhea.    She was on gentle IV hydration.  4.  History of atrial flutter, PE  and DVT.  Continue anticoagulation with with Eliquis.  5.  Hypertension, stable, resume home medications.  6.  Sjogren's disease, stable, continue maintenance therapy.  7.  Pulmonary nodules, liver and spleen lesions per CAT scan report.  This findings were discussed with patient and the family.  Patient would like to follow-up with her hematology oncology specialist as outpatient to discuss further management, as she would like to avoid any aggressive procedures.  All the records are reviewed and case discussed with Care Management/Social Worker. Management plans discussed with the patient, her daughter and they are in agreement.  CODE STATUS: Full Code  TOTAL TIME TAKING CARE OF THIS PATIENT: 27 minutes.   More than 50% of the time was spent in counseling/coordination of care: YES  POSSIBLE D/C IN 2 DAYS, DEPENDING ON CLINICAL CONDITION.   Demetrios Loll M.D on 10/01/2018 at 3:10 PM  Between 7am to 6pm - Pager - [phone]  After 6pm go to [email or web address] - [facility]

## 2018-10-01 NOTE — Progress Notes (Signed)
*  PRELIMINARY RESULTS* Echocardiogram 2D Echocardiogram has been performed.  Sherrie Sport 10/01/2018, 1:45 PM

## 2018-10-01 NOTE — Progress Notes (Signed)
Advanced Care Plan.  Purpose of Encounter: CODE STATUS. Parties in Attendance: The patient, her daughter and me. Patient's Decisional Capacity: Yes. Medical Story:  Carol Bryan  is a 84 y.o. female with a known history of atrial flutter, DVT, hypertension, PE, rheumatoid arthritis, Sjogren's disease.  She is admitted for acute sigmoid diverticulitis and acute renal failure.  I discussed with the patient about her current condition, prognosis and CODE STATUS.  The patient hesitated to decide.  She said she will think about it.  Her daughter also wants to discuss with the patient about CODE STATUS. Plan:  Code Status: Full code for now. Time spent discussing advance care planning: 18 minutes.

## 2018-10-02 DIAGNOSIS — R918 Other nonspecific abnormal finding of lung field: Secondary | ICD-10-CM

## 2018-10-02 DIAGNOSIS — R160 Hepatomegaly, not elsewhere classified: Secondary | ICD-10-CM

## 2018-10-02 DIAGNOSIS — D509 Iron deficiency anemia, unspecified: Secondary | ICD-10-CM

## 2018-10-02 DIAGNOSIS — D61818 Other pancytopenia: Secondary | ICD-10-CM

## 2018-10-02 LAB — CBC
HEMATOCRIT: 29.2 % — AB (ref 36.0–46.0)
HEMOGLOBIN: 8.6 g/dL — AB (ref 12.0–15.0)
MCH: 27.3 pg (ref 26.0–34.0)
MCHC: 29.5 g/dL — ABNORMAL LOW (ref 30.0–36.0)
MCV: 92.7 fL (ref 80.0–100.0)
Platelets: 141 10*3/uL — ABNORMAL LOW (ref 150–400)
RBC: 3.15 MIL/uL — AB (ref 3.87–5.11)
RDW: 16 % — ABNORMAL HIGH (ref 11.5–15.5)
WBC: 2.7 10*3/uL — ABNORMAL LOW (ref 4.0–10.5)
nRBC: 0 % (ref 0.0–0.2)

## 2018-10-02 LAB — BASIC METABOLIC PANEL
Anion gap: 6 (ref 5–15)
BUN: 17 mg/dL (ref 8–23)
CHLORIDE: 105 mmol/L (ref 98–111)
CO2: 25 mmol/L (ref 22–32)
CREATININE: 1.1 mg/dL — AB (ref 0.44–1.00)
Calcium: 7.3 mg/dL — ABNORMAL LOW (ref 8.9–10.3)
GFR calc Af Amer: 49 mL/min — ABNORMAL LOW (ref >60)
GFR calc non Af Amer: 42 mL/min — ABNORMAL LOW (ref >60)
GLUCOSE: 94 mg/dL (ref 70–99)
Potassium: 3.9 mmol/L (ref 3.5–5.1)
Sodium: 136 mmol/L (ref 135–145)

## 2018-10-02 LAB — MAGNESIUM: Magnesium: 2 mg/dL (ref 1.7–2.4)

## 2018-10-02 LAB — IRON AND TIBC
IRON: 24 ug/dL — AB (ref 28–170)
Saturation Ratios: 14 % (ref 10.4–31.8)
TIBC: 173 ug/dL — ABNORMAL LOW (ref 250–450)
UIBC: 149 ug/dL

## 2018-10-02 LAB — GLUCOSE, CAPILLARY
GLUCOSE-CAPILLARY: 76 mg/dL (ref 70–99)
Glucose-Capillary: 69 mg/dL — ABNORMAL LOW (ref 70–99)

## 2018-10-02 LAB — FERRITIN: Ferritin: 462 ng/mL — ABNORMAL HIGH (ref 11–307)

## 2018-10-02 LAB — ECHOCARDIOGRAM COMPLETE
Height: 62 in
WEIGHTICAEL: 2472.68 [oz_av]

## 2018-10-02 MED ORDER — METRONIDAZOLE 500 MG PO TABS
500.0000 mg | ORAL_TABLET | Freq: Three times a day (TID) | ORAL | Status: DC
  Administered 2018-10-02 (×2): 500 mg via ORAL
  Filled 2018-10-02 (×3): qty 1

## 2018-10-02 MED ORDER — APIXABAN 5 MG PO TABS
5.0000 mg | ORAL_TABLET | Freq: Two times a day (BID) | ORAL | Status: DC
  Administered 2018-10-02 – 2018-10-03 (×2): 5 mg via ORAL
  Filled 2018-10-02 (×2): qty 1

## 2018-10-02 MED ORDER — SODIUM CHLORIDE 0.9 % IV SOLN
510.0000 mg | Freq: Once | INTRAVENOUS | Status: AC
  Administered 2018-10-02: 510 mg via INTRAVENOUS
  Filled 2018-10-02: qty 17

## 2018-10-02 MED ORDER — ALUM & MAG HYDROXIDE-SIMETH 200-200-20 MG/5ML PO SUSP: 15.0000 mL | Freq: Four times a day (QID) | ORAL | Status: DC | PRN

## 2018-10-02 MED ORDER — BISACODYL 10 MG RE SUPP
10.0000 mg | Freq: Every day | RECTAL | Status: DC | PRN
  Filled 2018-10-02: qty 1

## 2018-10-02 NOTE — Progress Notes (Signed)
Pharmacy Antibiotic Note  Carol Bryan is a 84 y.o. female admitted on 09/30/2018 with Intra-Abdominal infection.  Pharmacy has been consulted for Zosyn dosing.  Plan: Zosyn 3.375g IV q8h (4 hour infusion).  Scr 1.1 mg/dl  Height: 5\' 2"  (157.5 cm) Weight: 154 lb 8.7 oz (70.1 kg) IBW/kg (Calculated) : 50.1  Temp (24hrs), Avg:97.6 F (36.4 C), Min:97.6 F (36.4 C), Max:97.6 F (36.4 C)  Recent Labs  Lab 09/30/18 1958 10/01/18 0428 10/02/18 0414  WBC  --  2.6* 2.7*  CREATININE 1.11* 1.11* 1.10*    Estimated Creatinine Clearance: 29.3 mL/min (A) (by C-G formula based on SCr of 1.1 mg/dL (H)).    Allergies  Allergen Reactions  . Ciprofloxacin Other (See Comments)    Foot burning; tolerates levaquin  . Methotrexate Other (See Comments)    leukopenia  . Gold-Containing Drug Products Rash  . Other Other (See Comments) and Rash  . Sulfa Antibiotics Rash    Antimicrobials this admission: Zosyn 11/6 >>       >>    Dose adjustments this admission:    Microbiology results:   BCx:     UCx:      Sputum:      MRSA PCR:    Thank you for allowing pharmacy to be a part of this patient's care.  Laural Benes, Pharm.D., BCPS Clinical Pharmacist 10/02/2018 9:12 AM

## 2018-10-02 NOTE — Consult Note (Signed)
[facility] Nurse wound consult note.  Patient evaluated at [facility]. Female visitor present. Patient able to provide entire history to abrasions on RLE.  Reason for Consult: RLE wounds Wound type: Abrasions Measurement: Right pretibial (shin) measures 2cm x 1.7cm. Wound bed: 100% pink clean Drainage (amount, consistency, odor) no drainage, no odor, no induration. Pt states she has been keeping wound open to air to allow scab to form. She reports when a knee high compression hose was removed during this admission, the scab peeled off.  Periwound: Normal color and texture however very thin and fragile.  Dressing procedure/placement/frequency: Gently cleanse right shin abrasion with soap and water. Pat dry. Cover with foam dressing. Foam dressing can remain in place for 3 days.  Monitor the wound area(s) for worsening of condition such as: Signs/symptoms of infection,  Increase in size,  Development of or worsening of odor, Development of pain, or increased pain at the affected locations.  Notify the medical team if any of these develop.  Thank you for the consult.  Discussed plan of care with the patient and bedside nurse.  [facility] nurse will not follow at this time.  Please re-consult the [place] team if needed.  Val Riles, RN, MSN, CWOCN, CNS-BC, pager [phone]

## 2018-10-02 NOTE — Progress Notes (Signed)
[facility] at [place] NAME: Carol Bryan    MR#:  [id number]  DATE OF BIRTH:  1923/04/15  SUBJECTIVE:  CHIEF COMPLAINT:   Chief Complaint  Patient presents with  . Abdominal Pain  . Chest Pain   The patient has no abdominal pain, no nausea, vomiting,  No bowel movement. She related to soft diet. REVIEW OF SYSTEMS:  Review of Systems  Constitutional: Negative for chills, fever and malaise/fatigue.  HENT: Negative for sore throat.   Eyes: Negative for blurred vision and double vision.  Respiratory: Negative for cough, hemoptysis, shortness of breath, wheezing and stridor.   Cardiovascular: Negative for chest pain, palpitations, orthopnea and leg swelling.  Gastrointestinal: Negative for abdominal pain, blood in stool, diarrhea, melena, nausea and vomiting.  Genitourinary: Negative for dysuria, flank pain and hematuria.  Musculoskeletal: Negative for back pain and joint pain.  Skin: Negative for rash.  Neurological: Negative for dizziness, sensory change, focal weakness, seizures, loss of consciousness, weakness and headaches.  Endo/Heme/Allergies: Negative for polydipsia.  Psychiatric/Behavioral: Negative for depression. The patient is not nervous/anxious.     DRUG ALLERGIES:   Allergies  Allergen Reactions  . Ciprofloxacin Other (See Comments)    Foot burning; tolerates levaquin  . Methotrexate Other (See Comments)    leukopenia  . Gold-Containing Drug Products Rash  . Other Other (See Comments) and Rash  . Sulfa Antibiotics Rash   VITALS:  Blood pressure 112/83, pulse 70, temperature 97.6 F (36.4 C), temperature source Oral, resp. rate 17, height 5\' 2"  (1.575 m), weight 70.1 kg, SpO2 98 %. PHYSICAL EXAMINATION:  Physical Exam  Constitutional: She is oriented to person, place, and time. She appears well-developed. No distress.  HENT:  Head: Normocephalic.  Mouth/Throat: Oropharynx is clear and moist.  Eyes: Pupils are  equal, round, and reactive to light. Conjunctivae and EOM are normal. No scleral icterus.  Neck: Normal range of motion. Neck supple. No JVD present. No tracheal deviation present.  Cardiovascular: Normal rate, regular rhythm and normal heart sounds. Exam reveals no gallop.  No murmur heard. Pulmonary/Chest: Effort normal and breath sounds normal. No respiratory distress. She has no wheezes. She has no rales.  Abdominal: Soft. Bowel sounds are normal. She exhibits no distension. There is no tenderness. There is no rebound.  Musculoskeletal: Normal range of motion. She exhibits no edema or tenderness.  Neurological: She is alert and oriented to person, place, and time. No cranial nerve deficit.  Skin: No rash noted. No erythema.  Psychiatric: She has a normal mood and affect.   LABORATORY PANEL:  Female CBC Recent Labs  Lab 10/02/18 0414  WBC 2.7*  HGB 8.6*  HCT 29.2*  PLT 141*   ------------------------------------------------------------------------------------------------------------------ Chemistries  Recent Labs  Lab 10/02/18 0414  NA 136  K 3.9  CL 105  CO2 25  GLUCOSE 94  BUN 17  CREATININE 1.10*  CALCIUM 7.3*  MG 2.0   RADIOLOGY:  No results found. ASSESSMENT AND PLAN:   1.  Acute comorbid diverticulitis,  She has been treated with Zosyn IV and IV fluids.  Due to recent course of antibiotics and low immunity, patient is a high risk for C. difficile colitis.   But no stool for C. Difficile test.  Looks like not C. difficile colitis.  2.  Borderline elevated troponin level, likely related to demand ischemia from acute illness. No ACS.    Follow-up 2D: LV EF: 55% -   65%.  3.  Acute  renal failure, likely prerenal, secondary to dehydration from vomiting and diarrhea.    Improved with gentle IV hydration.  4.  History of atrial flutter, PE and DVT.  Continue anticoagulation with with Eliquis.  5.  Hypertension, stable, resume home medications.  6.  Sjogren's  disease, stable, continue maintenance therapy.  7.  Pulmonary nodules, liver and spleen lesions per CAT scan report.  This findings were discussed with patient and the family.  Patient would like to follow-up with her hematology oncology specialist as outpatient to discuss further management, as she would like to avoid any aggressive procedures.   Anemia and pancytopenia.  Hemoglobin decreased to 8.6.  No active bleeding. Check stool occult, follow-up CBC and oncology consult.  All the records are reviewed and case discussed with Care Management/Social Worker. Management plans discussed with the patient, her daughter and they are in agreement.  CODE STATUS: Full Code  TOTAL TIME TAKING CARE OF THIS PATIENT: 26 minutes.   More than 50% of the time was spent in counseling/coordination of care: YES  POSSIBLE D/C IN 1-2 DAYS, DEPENDING ON CLINICAL CONDITION.   Demetrios Loll M.D on 10/02/2018 at 2:37 PM  Between 7am to 6pm - Pager - [phone]  After 6pm go to [email or web address] - [facility]

## 2018-10-02 NOTE — Progress Notes (Addendum)
Pharmacist - Prescriber Communication  Apixaban dose modified from 2.5 mg po BID to 5 mg po BID. The dose was originally reduced 05/20/18 by cardiology for age > 23 and SCr >1.5 mg/dL (indication atrial fibrillation) but SCr is now 1.1 mg/dL and patient no longer meets criteria for dose reduction. This patient also has a history of DVT/PE and the dose is not reduced for that indication.  Ebrima Ranta A. Smarr, [place].D., BCPS Clinical Pharmacist 10/02/2018 09:08

## 2018-10-02 NOTE — Consult Note (Signed)
[place]  [phone]) [phone] [phone]) [phone]  ID: Carol Bryan OB: 11-17-1923  MR#: [id number]  XNA#:[id number]  Patient Care Team: Adin Hector, MD as PCP - General (Internal Medicine) Corey Skains, MD as Consulting Physician (Cardiology)  CHIEF COMPLAINT: Pancytopenia with worsening anemia.  INTERVAL HISTORY: Patient is a 84 year old female with a long-standing history of pancytopenia who was recently admitted to the hospital with increased abdominal pain and diarrhea.  She was found to have diverticulitis.  She also has a history of pancytopenia, now with worsening hemoglobin.  Patient feels improved since admission.  She does not complain of any further abdominal pain.  She has no neurologic complaints.  She does not complain of weakness or fatigue.  She has no further chest pain and denies any shortness of breath, cough, hemoptysis.  She has no further nausea, vomiting, constipation, or diarrhea.  She has no melena or hematochezia.  She has no urinary complaints.  Patient offers no further specific complaints today.  REVIEW OF SYSTEMS:   Review of Systems  Constitutional: Negative.  Negative for fever, malaise/fatigue and weight loss.  Respiratory: Negative.  Negative for cough, hemoptysis and shortness of breath.   Cardiovascular: Negative.  Negative for chest pain and leg swelling.  Gastrointestinal: Negative.  Negative for abdominal pain, blood in stool and melena.  Genitourinary: Negative.  Negative for hematuria.  Musculoskeletal: Negative for back pain.  Skin: Negative.  Negative for rash.  Neurological: Negative.  Negative for focal weakness, weakness and headaches.  Psychiatric/Behavioral: Negative.  The patient is not nervous/anxious.     As per HPI. Otherwise, a complete review of systems is negative.  PAST MEDICAL HISTORY: Past Medical History:  Diagnosis Date  . Atrial flutter ([place])    cardioverted  . Collagen vascular  disease (H[facility])    RA  . DVT (deep venous thrombosis) ([place])   . HLD (hyperlipidemia)   . Hypertension   . Hypothyroid   . Pulmonary embolism ([place])   . RA (rheumatoid arthritis) ([place])   . Sjogren's disease ([place])     PAST SURGICAL HISTORY: Past Surgical History:  Procedure Laterality Date  . ABDOMINAL HYSTERECTOMY    . APPENDECTOMY    . ASD REPAIR  age 75  . cataracts    . JOINT REPLACEMENT Left    elbow  . PATENT DUCTUS ARTERIOUS REPAIR      FAMILY HISTORY: Family History  Problem Relation Age of Onset  . Cancer Brother   . Stroke Mother   . Dementia Mother   . Kidney cancer Father   . Stroke Sister   . Cancer Sister   . Lung cancer Sister   . Liver cancer Sister     ADVANCED DIRECTIVES (Y/N):  @ADVDIR @  HEALTH MAINTENANCE: Social History   Tobacco Use  . Smoking status: Never Smoker  . Smokeless tobacco: Never Used  Substance Use Topics  . Alcohol use: Yes    Comment: occasionally   . Drug use: No     Colonoscopy:  PAP:  Bone density:  Lipid panel:  Allergies  Allergen Reactions  . Ciprofloxacin Other (See Comments)    Foot burning; tolerates levaquin  . Methotrexate Other (See Comments)    leukopenia  . Gold-Containing Drug Products Rash  . Other Other (See Comments) and Rash  . Sulfa Antibiotics Rash    Current Facility-Administered Medications  Medication Dose Route Frequency Provider Last Rate Last Dose  . acetaminophen (TYLENOL) tablet 650 mg  650 mg  Oral Q6H PRN Amelia Jo, MD       Or  . acetaminophen (TYLENOL) suppository 650 mg  650 mg Rectal Q6H PRN Amelia Jo, MD      . amitriptyline (ELAVIL) tablet 20 mg  20 mg Oral QHS Amelia Jo, MD   20 mg at 10/01/18 2157  . apixaban (ELIQUIS) tablet 5 mg  5 mg Oral BID Demetrios Loll, MD      . azelastine (ASTELIN) 0.1 % nasal spray 1 spray  1 spray Each Nare BID Amelia Jo, MD   1 spray at 10/02/18 [phone]  . bisacodyl (DULCOLAX) suppository 10 mg  10 mg Rectal Daily PRN Demetrios Loll, MD       . Derrill Memo ON 10/05/2018] cholecalciferol (VITAMIN D3) tablet 1,000 Units  1,000 Units Oral Weekly Amelia Jo, MD      . docusate sodium (COLACE) capsule 100 mg  100 mg Oral BID Amelia Jo, MD   100 mg at 10/02/18 0853  . ferumoxytol (FERAHEME) 510 mg in sodium chloride 0.9 % 100 mL IVPB  510 mg Intravenous Once Lloyd Huger, MD      . folic acid (FOLVITE) tablet 3 mg  3 mg Oral Daily Amelia Jo, MD   3 mg at 10/02/18 0851  . HYDROcodone-acetaminophen (NORCO/VICODIN) 5-325 MG per tablet 1-2 tablet  1-2 tablet Oral Q4H PRN Amelia Jo, MD   1 tablet at 10/01/18 0807  . hydroxychloroquine (PLAQUENIL) tablet 200 mg  200 mg Oral Daily Amelia Jo, MD   200 mg at 10/02/18 9622  . levothyroxine (SYNTHROID, LEVOTHROID) tablet 50 mcg  50 mcg Oral QAC breakfast Amelia Jo, MD   50 mcg at 10/02/18 2979  . lidocaine (LIDODERM) 5 % 1 patch  1 patch Transdermal Daily Amelia Jo, MD   1 patch at 10/02/18 [phone]  . metoprolol succinate (TOPROL-XL) 24 hr tablet 12.5 mg  12.5 mg Oral Daily Amelia Jo, MD   12.5 mg at 10/01/18 2157  . metroNIDAZOLE (FLAGYL) tablet 500 mg  500 mg Oral Q8H Demetrios Loll, MD   500 mg at 10/02/18 1459  . ondansetron (ZOFRAN) tablet 4 mg  4 mg Oral Q6H PRN Amelia Jo, MD       Or  . ondansetron [facility]) injection 4 mg  4 mg Intravenous Q6H PRN Amelia Jo, MD      . polyvinyl alcohol (LIQUIFILM TEARS) 1.4 % ophthalmic solution 1 drop  1 drop Both Eyes PRN Amelia Jo, MD      . potassium chloride SA (K-DUR,KLOR-CON) CR tablet 20 mEq  20 mEq Oral Daily Amelia Jo, MD   20 mEq at 10/02/18 0851  . predniSONE (DELTASONE) tablet 5 mg  5 mg Oral BID WC Amelia Jo, MD   5 mg at 10/02/18 1941  . traZODone (DESYREL) tablet 25 mg  25 mg Oral QHS PRN Amelia Jo, MD   25 mg at 10/01/18 2157    OBJECTIVE: Vitals:   10/02/18 1039 10/02/18 1558  BP: 112/83 110/71  Pulse: 70 82  Resp:    Temp: 97.6 F (36.4 C) 97.9 F (36.6 C)  SpO2: 98% 98%     Body  mass index is 28.27 kg/m.    ECOG FS:0 - Asymptomatic  General: Well-developed, well-nourished, no acute distress. Eyes: Pink conjunctiva, anicteric sclera. HEENT: Normocephalic, moist mucous membranes, clear oropharnyx. Lungs: Clear to auscultation bilaterally. Heart: Regular rate and rhythm. No rubs, murmurs, or gallops. Abdomen: Soft, nontender, nondistended. No organomegaly noted, normoactive bowel sounds. Musculoskeletal: No edema,  cyanosis, or clubbing. Neuro: Alert, answering all questions appropriately. Cranial nerves grossly intact. Skin: No rashes or petechiae noted. Psych: Normal affect. Lymphatics: No cervical, calvicular, axillary or inguinal LAD.   LAB RESULTS:  Lab Results  Component Value Date   NA 136 10/02/2018   K 3.9 10/02/2018   CL 105 10/02/2018   CO2 25 10/02/2018   GLUCOSE 94 10/02/2018   BUN 17 10/02/2018   CREATININE 1.10 (H) 10/02/2018   CALCIUM 7.3 (L) 10/02/2018   PROT 6.5 06/20/2013   ALBUMIN 3.4 06/20/2013   AST 26 06/20/2013   ALT 20 06/20/2013   ALKPHOS 78 06/20/2013   BILITOT 0.6 06/20/2013   GFRNONAA 42 (L) 10/02/2018   GFRAA 49 (L) 10/02/2018    Lab Results  Component Value Date   WBC 2.7 (L) 10/02/2018   NEUTROABS 2.4 07/06/2018   HGB 8.6 (L) 10/02/2018   HCT 29.2 (L) 10/02/2018   MCV 92.7 10/02/2018   PLT 141 (L) 10/02/2018   Lab Results  Component Value Date   IRON 24 (L) 10/02/2018   TIBC 173 (L) 10/02/2018   IRONPCTSAT 14 10/02/2018   Lab Results  Component Value Date   FERRITIN 462 (H) 10/02/2018     STUDIES: Dg Chest 2 View  Result Date: 09/30/2018 CLINICAL DATA:  Lower chest pain.  Chronic shortness of breath. EXAM: CHEST - 2 VIEW COMPARISON:  Chest radiograph October 29, 2017 FINDINGS: Cardiac silhouette is upper limits of normal, mediastinal silhouette is unremarkable for this low inspiratory examination with crowded vasculature markings. Calcified aortic arch. The lungs are clear without pleural effusions or  focal consolidations. Mild chronic interstitial changes. Mid and lower lung zone atelectasis/scarring. Trachea projects midline and there is no pneumothorax. Included soft tissue planes and osseous structures are non-suspicious. LEFT humerus intramedullary rod. Osteopenia. High-riding humeral heads seen with old rotator cuff injuries. IMPRESSION: Borderline cardiomegaly. Chronic interstitial changes with bilateral atelectasis/scarring. Aortic Atherosclerosis (ICD10-I70.0). Electronically Signed   By: Elon Alas M.D.   On: 09/30/2018 18:41   Ct Abdomen Pelvis W Contrast  Result Date: 09/30/2018 CLINICAL DATA:  Left upper quadrant abdominal pain starting on Sunday. Nausea and vomiting. EXAM: CT ABDOMEN AND PELVIS WITH CONTRAST TECHNIQUE: Multidetector CT imaging of the abdomen and pelvis was performed using the standard protocol following bolus administration of intravenous contrast. CONTRAST:  189mL ISOVUE-300 IOPAMIDOL (ISOVUE-300) INJECTION 61% COMPARISON:  Multiple exams, including 07/24/2015 FINDINGS: Lower chest: Multiple new bibasilar pulmonary nodules although some of the previous nodules on the 12/24/2016 exam have resolved, and some are stable. A posterior basal segment right lower lobe nodule measuring 2.1 by 1.4 cm on image 12/4 is new today. Another right lower lobe nodule measuring 1.8 by 0.9 cm on image 6/4 is new. A third right lower lobe pulmonary nodule with slightly irregular margins measuring 1.2 by 0.8 cm on image 4/4 is new. Peripheral nodules in the right middle lobe on image 3/4 are stable. In the left lower lobe, there is a new 1.2 by 1.0 cm nodule on image 8/4. There is also some pleural-based nodularity anteriorly in the left lower lobe which is stable. Other smaller pulmonary nodules are present in the lung bases. Calcification of the mitral valve and of the wall of the descending thoracic aorta. Hepatobiliary: Multiple new hypodense hepatic lesions are observed. These include a  2.0 by 1.8 cm lesion in segment 5 on image 23/2 and has some peripheral enhancement; a 1.7 by 1.3 cm hypoenhancing lesion posteriorly in the right hepatic lobe  on image 19/2 which becomes less conspicuous on the delayed images; and a 1.8 by 1.3 cm posteromedial right hepatic lobe lesion on image 22/2, likewise with some reduced conspicuity on the delayed images. The gallbladder appears unremarkable. No biliary dilatation. Pancreas: Unremarkable Spleen: The spleen measures 14.7 by 5.5 by 14.9 cm and contains numerous hypoenhancing scattered lesions throughout the spleen as well as some geographic abnormal hypodensity in the posterolateral spleen which could be from infarct or tumor. Adrenals/Urinary Tract: The adrenal glands appear unremarkable. Two benign left renal cysts are present. Pelvic floor laxity with cystocele. Several small additional hypodense lesions in the kidneys are statistically likely to be small cysts but technically nonspecific. Stomach/Bowel: Large proximal duodenal diverticulum. 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 Somewhat diffuse colonic diverticulosis most severe in the sigmoid colon; a rim enhancing 2.3 by 2.0 cm presumed diverticulum along the margin of the sigmoid colon is probably inflamed compatible with acute diverticulitis. This appears to communicate with the bowel and accordingly I do not believe it to be an abscess. There is an anastomotic staple line near the anus and low position of the anorectal junction due to the considerable pelvic floor laxity. Vascular/Lymphatic: Aortoiliac atherosclerotic vascular disease. 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 Atherosclerotic narrowing of the superior mesenteric artery. No overt pathologic adenopathy. Reproductive: Uterus absent.  Adnexa unremarkable. Other: No supplemental non-categorized findings. Musculoskeletal: Prominent pelvic floor laxity as noted above. Grade 1 degenerative  anterolisthesis at L3-4. Lumbar spondylosis and degenerative disc disease cause impingement at L3-4, L4-5, and L5-S1. IMPRESSION: 1. Moderate but focal acute sigmoid colon diverticulitis with diffuse colonic diverticulosis. 2. Multiple new bibasilar pulmonary nodules measuring up to 2.1 cm in long axis. Although the patient does seem to have a history of waxing and waning pulmonary nodules which could be from atypical infectious process, there are new hypoenhancing lesions in the liver and in the spleen along with splenomegaly and likely a splenic infarct. This raises concern for lymphoproliferative malignancy or other malignancy with metastatic disease to the liver and spleen. I recommend either nuclear medicine PET-CT, or further scanning with dedicated CT chest to provide complementary information. Sarcoidosis could also cause pulmonary, hepatic, and splenic lesions. 3. Considerable pelvic floor laxity with cystocele and very low position of the anorectal junction, where there are postoperative findings. 4. Other imaging findings of potential clinical significance: Aortic Atherosclerosis (ICD10-I70.0). 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 Multilevel lower lumbar impingement. Benign-appearing left renal cysts. Calcification of the mitral valve. Electronically Signed   By: Van Clines M.D.   On: 09/30/2018 17:19   US Venous Img Lower Bilateral  Result Date: 09/30/2018 CLINICAL DATA:  Dyspnea.  Prior LEFT lower extremity DVT EXAM: BILATERAL LOWER EXTREMITY VENOUS DOPPLER ULTRASOUND TECHNIQUE: Gray-scale sonography with graded compression, as well as color Doppler and duplex ultrasound were performed to evaluate the lower extremity deep venous systems from the level of the common femoral vein and including the common femoral, femoral, profunda femoral, popliteal and calf veins including the posterior tibial, peroneal and gastrocnemius veins when visible. The superficial great saphenous  vein was also interrogated. Spectral Doppler was utilized to evaluate flow at rest and with distal augmentation maneuvers in the common femoral, femoral and popliteal veins. COMPARISON:  12/25/2016 FINDINGS: RIGHT LOWER EXTREMITY Common Femoral Vein: No evidence of thrombus. Normal compressibility, respiratory phasicity and response to augmentation. Saphenofemoral Junction: No evidence of thrombus. Normal compressibility and flow on color Doppler imaging. Profunda Femoral  Vein: No evidence of thrombus. Normal compressibility and flow on color Doppler imaging. Femoral Vein: No evidence of thrombus. Normal compressibility, respiratory phasicity and response to augmentation. Popliteal Vein: No evidence of thrombus. Normal compressibility, respiratory phasicity and response to augmentation. Calf Veins: No evidence of thrombus. Normal compressibility and flow on color Doppler imaging. Superficial Great Saphenous Vein: No evidence of thrombus. Normal compressibility. Venous Reflux:  None. Other Findings:  None. LEFT LOWER EXTREMITY Common Femoral Vein: No evidence of thrombus. Normal compressibility, respiratory phasicity and response to augmentation. Saphenofemoral Junction: No evidence of thrombus. Normal compressibility and flow on color Doppler imaging. Profunda Femoral Vein: No evidence of thrombus. Normal compressibility and flow on color Doppler imaging. Femoral Vein: No evidence of thrombus. Normal compressibility, respiratory phasicity and response to augmentation. Popliteal Vein: No evidence of thrombus. Normal compressibility, respiratory phasicity and response to augmentation. Calf Veins: No evidence of thrombus. Normal compressibility and flow on color Doppler imaging. Superficial Great Saphenous Vein: No evidence of thrombus. Normal compressibility. Venous Reflux:  None. Other Findings:  None. IMPRESSION: No evidence of RIGHT or LEFT deep venous thrombosis. Electronically Signed   By: Staci Righter M.D.    On: 09/30/2018 19:31    ASSESSMENT: Pancytopenia with worsening anemia.  PLAN:    1. Pancytopenia with worsening anemia: Patient's white blood cell count and platelet count are decreased, but relatively stable.  Her hemoglobin has trended down slightly and she has a mild iron deficiency.  Have ordered 510 mg IV Feraheme today.  Patient does not require a blood transfusion.  Will arrange follow-up in the cancer center 1 to 2 weeks after discharge for repeat laboratory work, further evaluation, and consideration of additional IV iron if necessary. 2.  Pulmonary nodules: Incidentally found on CT of the abdomen and pelvis.  Patient states given her advanced age she does not want any further work-up, but is willing to rediscuss as an outpatient after discharge. 3.  Hypoenhancing liver lesions: No further work-up requested by patient.  See above.  Appreciate consult, call with questions.   Lloyd Huger, MD   10/02/2018 4:58 PM

## 2018-10-02 NOTE — Care Management Important Message (Signed)
Important Message  Patient Details  Name: Carol Bryan MRN: [id number] Date of Birth: Jun 19, 1923   Medicare Important Message Given:  Yes    [name] A Mitchelle Sultan 10/02/2018, 10:58 AM

## 2018-10-03 LAB — CBC
HCT: 31.2 % — ABNORMAL LOW (ref 36.0–46.0)
Hemoglobin: 9.2 g/dL — ABNORMAL LOW (ref 12.0–15.0)
MCH: 27.5 pg (ref 26.0–34.0)
MCHC: 29.5 g/dL — ABNORMAL LOW (ref 30.0–36.0)
MCV: 93.4 fL (ref 80.0–100.0)
NRBC: 0 % (ref 0.0–0.2)
Platelets: 140 10*3/uL — ABNORMAL LOW (ref 150–400)
RBC: 3.34 MIL/uL — AB (ref 3.87–5.11)
RDW: 15.8 % — ABNORMAL HIGH (ref 11.5–15.5)
WBC: 2.5 10*3/uL — ABNORMAL LOW (ref 4.0–10.5)

## 2018-10-03 LAB — GLUCOSE, CAPILLARY: Glucose-Capillary: 89 mg/dL (ref 70–99)

## 2018-10-03 MED ORDER — AMOXICILLIN-POT CLAVULANATE 500-125 MG PO TABS
1.0000 | ORAL_TABLET | Freq: Two times a day (BID) | ORAL | Status: DC
  Administered 2018-10-03: 500 mg via ORAL
  Filled 2018-10-03 (×2): qty 1

## 2018-10-03 MED ORDER — METRONIDAZOLE 500 MG PO TABS: 500.0000 mg | ORAL_TABLET | Freq: Three times a day (TID) | ORAL | 0 refills | Status: DC

## 2018-10-03 MED ORDER — AMOXICILLIN-POT CLAVULANATE 875-125 MG PO TABS: 1.0000 | ORAL_TABLET | Freq: Two times a day (BID) | ORAL | 0 refills | Status: DC

## 2018-10-03 MED ORDER — AMOXICILLIN-POT CLAVULANATE 500-125 MG PO TABS: 1.0000 | ORAL_TABLET | Freq: Two times a day (BID) | ORAL | 0 refills | Status: DC

## 2018-10-03 MED ORDER — AMOXICILLIN-POT CLAVULANATE 875-125 MG PO TABS: 1.0000 | ORAL_TABLET | Freq: Two times a day (BID) | ORAL | Status: DC

## 2018-10-03 NOTE — Evaluation (Signed)
Physical Therapy Evaluation Patient Details Name: Carol Bryan MRN: [id number] DOB: March 08, 1923 Today's Date: 10/03/2018   History of Present Illness  84 y/o female here with diverticulitis.  At time of PT exam feeling back to normal and eager to get home.  Clinical Impression  Pt is eager to go home and willing to participate with PT to prove that she can.  She generally did well and should be able to return home w/o overt safety concerns.  She did have some minimal unsteadiness with ambulation (improved with change in SPC height), she also had some issues with getting to standing without heavy UE assist.  PT suggesting HHPT, but pt states she is not interested.  Overall pt is safe to go home, daughter present and agrees that they will be able to initially be around to help more as needed.     Follow Up Recommendations Home health PT(pt not interested despite PT attempts to explain limitations)    Equipment Recommendations       Recommendations for Other Services       Precautions / Restrictions Precautions Precautions: Fall Restrictions Weight Bearing Restrictions: No      Mobility  Bed Mobility Overal bed mobility: Independent                Transfers Overall transfer level: Independent Equipment used: Straight cane             General transfer comment: Pt was able to rise to standing on 3rd attempt.  She reports she typically does have a hard time getting up from "low" seats w/o arm rests  Ambulation/Gait Ambulation/Gait assistance: Min guard Gait Distance (Feet): 150 Feet Assistive device: Straight cane       General Gait Details: Pt did well with ambulation in the hallway with SPC (adjusted 3" down for more appropriate use). She did not have any LOBs but did have some occasional stagger stepping that she was able to self arrest.   Stairs Stairs: Yes Stairs assistance: Min guard Stair Management: One rail Right Number of Stairs: 4 General stair  comments: Pt was able to negotiate up/down steps with CGA.   Wheelchair Mobility    Modified Rankin (Stroke Patients Only)       Balance Overall balance assessment: Modified Independent                                           Pertinent Vitals/Pain Pain Assessment: No/denies pain    Home Living Family/patient expects to be discharged to:: Private residence Living Arrangements: Alone Available Help at Discharge: Family;Available PRN/intermittently Type of Home: House Home Access: Stairs to enter Entrance Stairs-Rails: Right Entrance Stairs-Number of Steps: 3 Home Layout: One level Home Equipment: Cane - single point;Grab bars - tub/shower      Prior Function Level of Independence: Independent with assistive device(s)         Comments: Pt ambulates without AD in home but does use SPC outside of home.  Pt denies any recent falls but daughter reports pt has had several near falls.  Pt able to do her cooking, light cleaning. Daughters do the heavy cleaning and drive the pt to the grocery store. Pt does drive to hair appointments.      Hand Dominance        Extremity/Trunk Assessment   Upper Extremity Assessment Upper Extremity Assessment: Generalized weakness(basline L>R UE weakness)  Lower Extremity Assessment Lower Extremity Assessment: Generalized weakness;Overall WFL for tasks assessed       Communication   Communication: No difficulties  Cognition Arousal/Alertness: Awake/alert Behavior During Therapy: WFL for tasks assessed/performed Overall Cognitive Status: Within Functional Limits for tasks assessed                                        General Comments      Exercises     Assessment/Plan    PT Assessment Patient needs continued PT services  PT Problem List Decreased strength;Decreased activity tolerance;Decreased balance;Decreased range of motion;Decreased mobility;Decreased knowledge of use of  DME;Decreased safety awareness       PT Treatment Interventions DME instruction;Gait training;Stair training;Functional mobility training;Therapeutic activities;Therapeutic exercise;Balance training;Neuromuscular re-education    PT Goals (Current goals can be found in the Care Plan section)  Acute Rehab PT Goals Patient Stated Goal: go home ASAP PT Goal Formulation: With patient Time For Goal Achievement: 10/17/18 Potential to Achieve Goals: Good    Frequency Min 2X/week   Barriers to discharge        Co-evaluation               AM-PAC PT "6 Clicks" Daily Activity  Outcome Measure Difficulty turning over in bed (including adjusting bedclothes, sheets and blankets)?: None Difficulty moving from lying on back to sitting on the side of the bed? : None Difficulty sitting down on and standing up from a chair with arms (e.g., wheelchair, bedside commode, etc,.)?: A Lot Help needed moving to and from a bed to chair (including a wheelchair)?: None Help needed walking in hospital room?: None Help needed climbing 3-5 steps with a railing? : A Little 6 Click Score: 21    End of Session Equipment Utilized During Treatment: Gait belt Activity Tolerance: Patient tolerated treatment well Patient left: with chair alarm set;with call bell/phone within reach Nurse Communication: Mobility status PT Visit Diagnosis: Muscle weakness (generalized) (M62.81);Difficulty in walking, not elsewhere classified (R26.2)    Time: 1245-8099 PT Time Calculation (min) (ACUTE ONLY): 17 min   Charges:   PT Evaluation $PT Eval Low Complexity: 1 Low          Kreg Shropshire, DPT 10/03/2018, 4:14 PM

## 2018-10-03 NOTE — Discharge Instructions (Signed)
HHPT °Fall precaution. °

## 2018-10-03 NOTE — Care Management Note (Signed)
Case Management Note  Patient Details  Name: Carol Bryan MRN: [id number] Date of Birth: [birth year]  Subjective/Objective:    Patient to be discharged per MD order. Orders in place for home health services. Patient and family agreeable to home health. [facility] are [facility] with [facility] and would like to use them again. Referral placed with Jermaine who agrees to accept the patient for RN,PT and aide services. I have given [facility] daughter [name]) phone number as the direct contact person. They are interested in buying a rollator for the patient. I directed them to the [place] care store but [facility] can also assist in obtaining this per Jermaine. No further needs. Family to transport.  Merrily Pew Kelis Plasse RN BSN RNCM [phone]                  Action/Plan:   Expected Discharge Date:                  Expected Discharge Plan:  [place]  In-House Referral:     Discharge planning Services  CM Consult  Post Acute Care Choice:  Home Health Choice offered to:  Patient  DME Arranged:    DME Agency:     HH Arranged:  RN, PT, Nurse's Aide [place] Agency:  [place]  Status of Service:  Completed, signed off  If discussed at [place] of Stay Meetings, dates discussed:    Additional Comments:  Latanya Maudlin, RN 10/03/2018, 2:07 PM

## 2018-10-03 NOTE — Progress Notes (Signed)
Patient had episode of nausea and dry heaving. Medicated. Relief given. Complaining of shoulder and abdominal pain. Medicated relief given. Per family having intermittent hallucinations. Confusion noted by staff. Family thinks may be related to oral antibiotic or sleeping aid. Would like to discuss with physician before continued treatment. Morning dose held. Will monitor

## 2018-10-03 NOTE — Discharge Summary (Signed)
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 Atrial flutter ([place])    cardioverted  . Collagen vascular disease ([facility])    RA  . DVT (deep venous thrombosis) ([place])   . HLD (hyperlipidemia)   . Hypertension   . Hypothyroid   . Pulmonary embolism ([place])   . RA (rheumatoid arthritis) ([place])   . Sjogren's disease ([place])    HOSPITAL COURSE:  1.Acute comorbid diverticulitis, She has been treated with Zosyn IV and IV fluids. Due to recent course of antibiotics and low immunity, patient is a high risk for C. difficile colitis.  But no stool for C. Difficile test.  Looks like not C. difficile colitis. Change to Augmentin p.o. for 5 more days.  2.Borderline elevated troponin level,likely related to demand ischemia from acute illness.No ACS.  Follow-up 2D: LV EF: 55% - 65%.  3.Acute renal failure, likely prerenal, secondary to dehydration from vomiting and diarrhea.  Improved with gentle IV hydration.  4.History of atrial flutter, PE and DVT.Continue anticoagulation with with Eliquis.  5.Hypertension, stable, resumed home medications.  6.Sjogren's disease,stable, continue maintenance therapy.  7.Pulmonary nodules, liver and spleen lesions per CAT scan report.This findings were discussed with patient and the family.Patient would like to follow-up with her hematology oncology specialist as outpatient to discuss further management,as she would like to avoid any aggressive procedures. Follow-up as outpatient per Dr.  Grayland Ormond.  Anemia and pancytopenia.  Hemoglobin decreased to 8.6.  No active bleeding.  Hemoglobin is stable at 9.2. The patient got IV iron yesterday.  Follow-up as outpatient per Dr. Grayland Ormond. Generalized weakness.  Patient needs home health and PT. DISCHARGE CONDITIONS:  Stable, discharge to home with home health and PT today. CONSULTS OBTAINED:  Treatment Team:  Lloyd Huger, MD DRUG ALLERGIES:   Allergies  Allergen Reactions  . Ciprofloxacin Other (See Comments)    Foot burning; tolerates levaquin  . Methotrexate Other (See Comments)    leukopenia  . Gold-Containing Drug Products Rash  . Other Other (See Comments) and Rash  . Sulfa Antibiotics Rash   DISCHARGE MEDICATIONS:   Allergies as of 10/03/2018      Reactions   Ciprofloxacin Other (See Comments)   Foot burning; tolerates levaquin   Methotrexate Other (See Comments)   leukopenia   Gold-containing Drug Products Rash   Other Other (See Comments), Rash   Sulfa Antibiotics Rash      Medication List    TAKE these medications   acetaminophen 500 MG tablet Commonly known as:  TYLENOL Take 500 mg by mouth daily as needed for mild pain or moderate pain.   amitriptyline 10 MG tablet Commonly known as:  ELAVIL Take 20 mg by mouth at bedtime.   amoxicillin-clavulanate 500-125 MG tablet Commonly known as:  AUGMENTIN Take 1 tablet (500 mg total) by mouth 2 (two) times daily.   apixaban 2.5 MG Tabs tablet Commonly known as:  ELIQUIS Take 2.5 mg by mouth 2 (two) times daily.   azelastine 0.1 % nasal spray Commonly known as:  ASTELIN Place 1 spray into both  nostrils 2 (two) times daily. Use in each nostril as directed   cholecalciferol 25 MCG (1000 UT) tablet Commonly known as:  VITAMIN D3 Take 1,000 Units by mouth once a week.   diclofenac sodium 1 % Gel Commonly known as:  VOLTAREN Apply 2 g topically 3 (three) times daily.   folic acid 1 MG tablet Commonly known as:  FOLVITE Take 3 mg by mouth  daily.   furosemide 20 MG tablet Commonly known as:  LASIX Take 10 mg by mouth 2 (two) times daily.   hydroxychloroquine 200 MG tablet Commonly known as:  PLAQUENIL Take 200 mg by mouth daily.   levothyroxine 50 MCG tablet Commonly known as:  SYNTHROID, LEVOTHROID Take 50 mcg by mouth daily before breakfast.   lidocaine 5 % Commonly known as:  LIDODERM Place 1 patch onto the skin daily. (remove after 12 hours or as directed   metoprolol succinate 12.5 mg Tb24 24 hr tablet Commonly known as:  TOPROL-XL Take 12.5 mg by mouth daily.   polyvinyl alcohol 1.4 % ophthalmic solution Commonly known as:  LIQUIFILM TEARS Place 1 drop into both eyes as needed for dry eyes.   potassium chloride SA 20 MEQ tablet Commonly known as:  K-DUR,KLOR-CON Take 20 mEq by mouth daily.   predniSONE 5 MG tablet Commonly known as:  DELTASONE Take 5 mg by mouth 2 (two) times daily with a meal.   tiZANidine 2 MG tablet Commonly known as:  ZANAFLEX Take 2 mg by mouth 2 (two) times daily as needed for muscle spasms.        DISCHARGE INSTRUCTIONS:  See AVS. If you experience worsening of your admission symptoms, develop shortness of breath, life threatening emergency, suicidal or homicidal thoughts you must seek medical attention immediately by calling 911 or calling your MD immediately  if symptoms less severe.  You Must read complete instructions/literature along with all the possible adverse reactions/side effects for all the Medicines you take and that have been prescribed to you. Take any new Medicines after you have completely understood and accpet all the possible adverse reactions/side effects.   Please note  You were cared for by a hospitalist during your hospital stay. If you have any questions about your discharge medications or the care you received while you were in the hospital after you are discharged, you can call the unit and asked to speak with the hospitalist on call if the  hospitalist that took care of you is not available. Once you are discharged, your primary care physician will handle any further medical issues. Please note that NO REFILLS for any discharge medications will be authorized once you are discharged, as it is imperative that you return to your primary care physician (or establish a relationship with a primary care physician if you do not have one) for your aftercare needs so that they can reassess your need for medications and monitor your lab values.    On the day of Discharge:  VITAL SIGNS:  Blood pressure 113/69, pulse 62, temperature 97.7 F (36.5 C), temperature source Oral, resp. rate 18, height 5\' 2"  (1.575 m), weight 70 kg, SpO2 96 %. PHYSICAL EXAMINATION:  GENERAL:  84 y.o.-year-old patient lying in the bed with no acute distress.  EYES: Pupils equal, round, reactive to light and accommodation. No scleral icterus. Extraocular muscles intact.  HEENT: Head atraumatic, normocephalic. Oropharynx and nasopharynx clear.  NECK:  Supple, no jugular venous distention. No thyroid enlargement, no tenderness.  LUNGS: Normal breath sounds bilaterally,  no wheezing, rales,rhonchi or crepitation. No use of accessory muscles of respiration.  CARDIOVASCULAR: S1, S2 normal. No murmurs, rubs, or gallops.  ABDOMEN: Soft, non-tender, non-distended. Bowel sounds present. No organomegaly or mass.  EXTREMITIES: No pedal edema, cyanosis, or clubbing.  NEUROLOGIC: Cranial nerves II through XII are intact. Muscle strength 4/5 in all extremities. Sensation intact. Gait not checked.  PSYCHIATRIC: The patient is alert and oriented x 3.  SKIN: No obvious rash, lesion, or ulcer.  DATA REVIEW:   CBC Recent Labs  Lab 10/03/18 0610  WBC 2.5*  HGB 9.2*  HCT 31.2*  PLT 140*    Chemistries  Recent Labs  Lab 10/02/18 0414  NA 136  K 3.9  CL 105  CO2 25  GLUCOSE 94  BUN 17  CREATININE 1.10*  CALCIUM 7.3*  MG 2.0     Microbiology Results  Results for  orders placed or performed during the hospital encounter of 06/08/18  Blood culture (routine x 2)     Status: Abnormal   Collection Time: 06/07/18 11:27 PM  Result Value Ref Range Status   Specimen Description   Final    BLOOD BLOOD LEFT ARM Performed at [facility], [address], [place], [place] [address]    Special Requests   Final    BOTTLES DRAWN AEROBIC AND ANAEROBIC Blood Culture adequate volume Performed at [facility], [address] [place], [place] [address]    Culture  Setup Time   Final    Organism ID to follow [place] CRITICAL RESULT CALLED TO, READ BACK BY AND VERIFIED WITH: SCOTT CHRISTY 06/08/18 AT 2107 BY HS Performed at [facility], [address] [place], [place] [address]    Culture (A)  Final    STAPHYLOCOCCUS SPECIES (COAGULASE NEGATIVE) THE SIGNIFICANCE OF ISOLATING THIS ORGANISM FROM A SINGLE SET OF BLOOD CULTURES WHEN MULTIPLE SETS ARE DRAWN IS UNCERTAIN. PLEASE NOTIFY THE MICROBIOLOGY DEPARTMENT WITHIN ONE WEEK IF SPECIATION AND SENSITIVITIES ARE REQUIRED. Performed at [facility], [place] [address] [place], [place] [address]    Report Status 06/10/2018 FINAL  Final  Blood Culture ID Panel (Reflexed)     Status: Abnormal   Collection Time: 06/07/18 11:27 PM  Result Value Ref Range Status   Enterococcus species NOT DETECTED NOT DETECTED Final   Listeria monocytogenes NOT DETECTED NOT DETECTED Final   Staphylococcus species DETECTED (A) NOT DETECTED Final    Comment: Methicillin (oxacillin) susceptible coagulase negative staphylococcus. Possible blood culture contaminant (unless isolated from more than one blood culture draw or clinical case suggests pathogenicity). No antibiotic treatment is indicated for blood  culture contaminants. CRITICAL RESULT CALLED TO, READ BACK BY AND VERIFIED WITH: SCOTT CHRISTY 06/08/18 AT 2107 BY HS    Staphylococcus aureus (BCID) NOT DETECTED NOT DETECTED  Final   Methicillin resistance NOT DETECTED NOT DETECTED Final   Streptococcus species NOT DETECTED NOT DETECTED Final   Streptococcus agalactiae NOT DETECTED NOT DETECTED Final   Streptococcus pneumoniae NOT DETECTED NOT DETECTED Final   Streptococcus pyogenes NOT DETECTED NOT DETECTED Final   Acinetobacter baumannii NOT DETECTED NOT DETECTED Final   Enterobacteriaceae species NOT DETECTED NOT DETECTED Final   Enterobacter cloacae complex NOT DETECTED NOT DETECTED Final   Escherichia coli NOT DETECTED NOT DETECTED Final   Klebsiella oxytoca NOT DETECTED NOT DETECTED Final   Klebsiella pneumoniae NOT DETECTED NOT DETECTED Final   Proteus species NOT DETECTED NOT DETECTED Final   Serratia marcescens NOT  DETECTED NOT DETECTED Final   Haemophilus influenzae NOT DETECTED NOT DETECTED Final   Neisseria meningitidis NOT DETECTED NOT DETECTED Final   Pseudomonas aeruginosa NOT DETECTED NOT DETECTED Final   Candida albicans NOT DETECTED NOT DETECTED Final   Candida glabrata NOT DETECTED NOT DETECTED Final   Candida krusei NOT DETECTED NOT DETECTED Final   Candida parapsilosis NOT DETECTED NOT DETECTED Final   Candida tropicalis NOT DETECTED NOT DETECTED Final    Comment: Performed at [facility], [address] [place], [place] [address]  Blood culture (routine x 2)     Status: None   Collection Time: 06/08/18 12:31 AM  Result Value Ref Range Status   Specimen Description BLOOD BLOOD RIGHT FOREARM  Final   Special Requests   Final    BOTTLES DRAWN AEROBIC AND ANAEROBIC Blood Culture results may not be optimal due to an excessive volume of blood received in culture bottles   Culture   Final    NO GROWTH 5 DAYS Performed at [facility], [address] [place], [place] [address]    Report Status 06/13/2018 FINAL  Final  Urine Culture     Status: None   Collection Time: 06/08/18 12:45 PM  Result Value Ref Range Status   Specimen Description   Final    URINE,  RANDOM Performed at [facility], [address], [place], [place] [address]    Special Requests   Final    NONE Performed at [facility], [address], [place], [place] [address]    Culture   Final    NO GROWTH Performed at [facility], [place] [address] [place], [place] [address]    Report Status 06/11/2018 FINAL  Final    RADIOLOGY:  No results found.   Management plans discussed with the patient, family and they are in agreement.  CODE STATUS: Full Code   TOTAL TIME TAKING CARE OF THIS PATIENT: 32 minutes.    Demetrios Loll M.D on 10/03/2018 at 12:51 PM  Between 7am to 6pm - Pager - [phone]  After 6pm go to [email or web address] - password [email or web address] [facility]  [facility]  Office  [phone]  CC: Primary care physician; Adin Hector, MD   Note: This dictation was prepared with Dragon dictation along with smaller phrase technology. Any transcriptional errors that result from this process are unintentional.

## 2018-10-03 NOTE — Discharge Planning (Signed)
Patient IV removed.  RN assessment and VS revealed stability for DC to home with [facility] (RN/PT). Discharge papers given, explained and educated.  Informed of suggested FU appts and patient/family agree to call to set up (since discharging on weekend).  Antibiotic script printed and given.  Once ready, will be wheeled to front and family transporting home via car.

## 2018-10-06 ENCOUNTER — Telehealth: Payer: Self-pay

## 2018-10-06 NOTE — Telephone Encounter (Signed)
EMMI Follow-up: Noted on the report that the patient had responded no to scheduled follow-up appointments but there were a couple to be made.  I talked with Steffanie Rainwater, daughter and she said they thought a small rollator would be delivered from [employer] but haven't seen anyone yet. She has made her mother's follow-up appointments:  Dr. Caryl Comes on Thursday. 11/14 and Dr. Grayland Ormond on Tuesday, 11/26. I talked with Merrily Pew, RN CM about the rollator to see if a order had been placed yet and he thought they were going to pick up one on Saturday at the [facility] store for $120.  If she wanted to run it through her insurance she would need a order from her PCP.  [name] said she would talk with Dr. Caryl Comes on Thursday about an order for one. RN from [facility] had been out to see the patient today but family felt [facility] PT and an aide would be best for her.  No other needs noted for today.  I let her know there would be a second automated call with a different series of questions and to let us know at that time if she had any other concerns.

## 2018-10-18 NOTE — Progress Notes (Signed)
[place]  [phone] [phone]  ID: Carol Bryan OB: [birth year]  MR#: [id number]  FXT#:[id number]  Patient Care Team: Adin Hector, MD as PCP - General (Internal Medicine) Corey Skains, MD as Consulting Physician (Cardiology)  CHIEF COMPLAINT: Pancytopenia, PE/DVT.  INTERVAL HISTORY: Patient returns to clinic today for repeat laboratory work and emergency room follow-up.  Over the past several days she has had a low-grade fever.  She also has intermittent diarrhea that is well controlled with Imodium.  She recently finished antibiotics for diverticulitis.  She otherwise feels well.  She has no neurologic complaints. She has a good appetite and denies weight loss. She denies any chest pain or shortness of breath. She has no cough or hemoptysis. She denies any nausea, vomiting, constipation, or diarrhea.  She has noted dark stools, but no hematochezia.  She has no urinary complaints.  Patient offers no further specific complaints today.  REVIEW OF SYSTEMS:   Review of Systems  Constitutional: Positive for fever and malaise/fatigue. Negative for weight loss.  Respiratory: Negative.  Negative for cough, hemoptysis and shortness of breath.   Cardiovascular: Negative.  Negative for chest pain and leg swelling.  Gastrointestinal: Positive for diarrhea and melena. Negative for abdominal pain and blood in stool.  Genitourinary: Negative.  Negative for hematuria.  Musculoskeletal: Negative.  Negative for back pain.  Skin: Negative.  Negative for rash.  Neurological: Positive for weakness. Negative for sensory change, focal weakness and headaches.  Endo/Heme/Allergies: Does not bruise/bleed easily.  Psychiatric/Behavioral: Negative.  The patient is not nervous/anxious.     As per HPI. Otherwise, a complete review of systems is negative.  PAST MEDICAL HISTORY: Past Medical History:  Diagnosis Date  . Atrial flutter ([place])    cardioverted    . Collagen vascular disease ([facility])    RA  . DVT (deep venous thrombosis) ([place])   . HLD (hyperlipidemia)   . Hypertension   . Hypothyroid   . Pulmonary embolism ([place])   . RA (rheumatoid arthritis) ([place])   . Sjogren's disease ([place])     PAST SURGICAL HISTORY: Past Surgical History:  Procedure Laterality Date  . ABDOMINAL HYSTERECTOMY    . APPENDECTOMY    . ASD REPAIR  age 82  . cataracts    . JOINT REPLACEMENT Left    elbow  . PATENT DUCTUS ARTERIOUS REPAIR      FAMILY HISTORY: Family History  Problem Relation Age of Onset  . Cancer Brother   . Stroke Mother   . Dementia Mother   . Kidney cancer Father   . Stroke Sister   . Cancer Sister   . Lung cancer Sister   . Liver cancer Sister     ADVANCED DIRECTIVES (Y/N):  N  HEALTH MAINTENANCE: Social History   Tobacco Use  . Smoking status: Never Smoker  . Smokeless tobacco: Never Used  Substance Use Topics  . Alcohol use: Yes    Comment: occasionally   . Drug use: No     Colonoscopy:  PAP:  Bone density:  Lipid panel:  Allergies  Allergen Reactions  . Ciprofloxacin Other (See Comments)    Foot burning; tolerates levaquin  . Methotrexate Other (See Comments)    leukopenia  . Gold-Containing Drug Products Rash  . Other Other (See Comments) and Rash  . Sulfa Antibiotics Rash    Current Outpatient Medications  Medication Sig Dispense Refill  . acetaminophen (TYLENOL) 500 MG tablet Take 500 mg by mouth daily as  needed for mild pain or moderate pain.    Marland Kitchen amitriptyline (ELAVIL) 10 MG tablet Take 20 mg by mouth at bedtime.    Marland Kitchen apixaban (ELIQUIS) 2.5 MG TABS tablet Take 2.5 mg by mouth 2 (two) times daily.    Marland Kitchen azelastine (ASTELIN) 0.1 % nasal spray Place 1 spray into both nostrils 2 (two) times daily. Use in each nostril as directed    . cholecalciferol (VITAMIN D3) 25 MCG (1000 UT) tablet Take 1,000 Units by mouth once a week.     . diclofenac sodium (VOLTAREN) 1 % GEL Apply 2 g topically 3 (three) times  daily.    . folic acid (FOLVITE) 1 MG tablet Take 3 mg by mouth daily.     . furosemide (LASIX) 20 MG tablet Take 10 mg by mouth 2 (two) times daily.     . hydroxychloroquine (PLAQUENIL) 200 MG tablet Take 200 mg by mouth daily.     Marland Kitchen levothyroxine (SYNTHROID, LEVOTHROID) 50 MCG tablet Take 50 mcg by mouth daily before breakfast.    . lidocaine (LIDODERM) 5 % Place 1 patch onto the skin daily. (remove after 12 hours or as directed    . metoprolol succinate (TOPROL-XL) 12.5 mg TB24 24 hr tablet Take 12.5 mg by mouth daily.    . polyvinyl alcohol (LIQUIFILM TEARS) 1.4 % ophthalmic solution Place 1 drop into both eyes as needed for dry eyes.    . potassium chloride SA (K-DUR,KLOR-CON) 20 MEQ tablet Take 20 mEq by mouth daily.    . predniSONE (DELTASONE) 5 MG tablet Take 5 mg by mouth 2 (two) times daily with a meal.     . tiZANidine (ZANAFLEX) 2 MG tablet Take 2 mg by mouth 2 (two) times daily as needed for muscle spasms.     No current facility-administered medications for this visit.     OBJECTIVE: Vitals:   10/20/18 1023  BP: 118/69  Pulse: 91  Temp: 97.8 F (36.6 C)     Body mass index is 28.81 kg/m.    ECOG FS:0 - Asymptomatic  General: Well-developed, well-nourished, no acute distress. Eyes: Pink conjunctiva, anicteric sclera. HEENT: Normocephalic, moist mucous membranes. Lungs: Clear to auscultation bilaterally. Heart: Regular rate and rhythm. No rubs, murmurs, or gallops. Abdomen: Soft, nontender, nondistended. No organomegaly noted, normoactive bowel sounds. Musculoskeletal: No edema, cyanosis, or clubbing. Neuro: Alert, answering all questions appropriately. Cranial nerves grossly intact. Skin: No rashes or petechiae noted. Psych: Normal affect.  LAB RESULTS:  Lab Results  Component Value Date   NA 136 10/02/2018   K 3.9 10/02/2018   CL 105 10/02/2018   CO2 25 10/02/2018   GLUCOSE 94 10/02/2018   BUN 17 10/02/2018   CREATININE 1.10 (H) 10/02/2018   CALCIUM 7.3  (L) 10/02/2018   PROT 6.5 06/20/2013   ALBUMIN 3.4 06/20/2013   AST 26 06/20/2013   ALT 20 06/20/2013   ALKPHOS 78 06/20/2013   BILITOT 0.6 06/20/2013   GFRNONAA 42 (L) 10/02/2018   GFRAA 49 (L) 10/02/2018    Lab Results  Component Value Date   WBC 3.0 (L) 10/20/2018   NEUTROABS 2.4 10/20/2018   HGB 10.7 (L) 10/20/2018   HCT 36.6 10/20/2018   MCV 92.9 10/20/2018   PLT 219 10/20/2018   Lab Results  Component Value Date   FERRITIN 1,199 (H) 10/20/2018   Lab Results  Component Value Date   IRON 31 10/20/2018   TIBC 236 (L) 10/20/2018   IRONPCTSAT 13 10/20/2018    STUDIES: Dg  Chest 2 View  Result Date: 09/30/2018 CLINICAL DATA:  Lower chest pain.  Chronic shortness of breath. EXAM: CHEST - 2 VIEW COMPARISON:  Chest radiograph October 29, 2017 FINDINGS: Cardiac silhouette is upper limits of normal, mediastinal silhouette is unremarkable for this low inspiratory examination with crowded vasculature markings. Calcified aortic arch. The lungs are clear without pleural effusions or focal consolidations. Mild chronic interstitial changes. Mid and lower lung zone atelectasis/scarring. Trachea projects midline and there is no pneumothorax. Included soft tissue planes and osseous structures are non-suspicious. LEFT humerus intramedullary rod. Osteopenia. High-riding humeral heads seen with old rotator cuff injuries. IMPRESSION: Borderline cardiomegaly. Chronic interstitial changes with bilateral atelectasis/scarring. Aortic Atherosclerosis (ICD10-I70.0). Electronically Signed   By: Elon Alas M.D.   On: 09/30/2018 18:41   Ct Abdomen Pelvis W Contrast  Result Date: 09/30/2018 CLINICAL DATA:  Left upper quadrant abdominal pain starting on Sunday. Nausea and vomiting. EXAM: CT ABDOMEN AND PELVIS WITH CONTRAST TECHNIQUE: Multidetector CT imaging of the abdomen and pelvis was performed using the standard protocol following bolus administration of intravenous contrast. CONTRAST:  147m  ISOVUE-300 IOPAMIDOL (ISOVUE-300) INJECTION 61% COMPARISON:  Multiple exams, including 07/24/2015 FINDINGS: Lower chest: Multiple new bibasilar pulmonary nodules although some of the previous nodules on the 12/24/2016 exam have resolved, and some are stable. A posterior basal segment right lower lobe nodule measuring 2.1 by 1.4 cm on image 12/4 is new today. Another right lower lobe nodule measuring 1.8 by 0.9 cm on image 6/4 is new. A third right lower lobe pulmonary nodule with slightly irregular margins measuring 1.2 by 0.8 cm on image 4/4 is new. Peripheral nodules in the right middle lobe on image 3/4 are stable. In the left lower lobe, there is a new 1.2 by 1.0 cm nodule on image 8/4. There is also some pleural-based nodularity anteriorly in the left lower lobe which is stable. Other smaller pulmonary nodules are present in the lung bases. Calcification of the mitral valve and of the wall of the descending thoracic aorta. Hepatobiliary: Multiple new hypodense hepatic lesions are observed. These include a 2.0 by 1.8 cm lesion in segment 5 on image 23/2 and has some peripheral enhancement; a 1.7 by 1.3 cm hypoenhancing lesion posteriorly in the right hepatic lobe on image 19/2 which becomes less conspicuous on the delayed images; and a 1.8 by 1.3 cm posteromedial right hepatic lobe lesion on image 22/2, likewise with some reduced conspicuity on the delayed images. The gallbladder appears unremarkable. No biliary dilatation. Pancreas: Unremarkable Spleen: The spleen measures 14.7 by 5.5 by 14.9 cm and contains numerous hypoenhancing scattered lesions throughout the spleen as well as some geographic abnormal hypodensity in the posterolateral spleen which could be from infarct or tumor. Adrenals/Urinary Tract: The adrenal glands appear unremarkable. Two benign left renal cysts are present. Pelvic floor laxity with cystocele. Several small additional hypodense lesions in the kidneys are statistically likely to be  small cysts but technically nonspecific. Stomach/Bowel: Large proximal duodenal diverticulum. 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 Somewhat diffuse colonic diverticulosis most severe in the sigmoid colon; a rim enhancing 2.3 by 2.0 cm presumed diverticulum along the margin of the sigmoid colon is probably inflamed compatible with acute diverticulitis. This appears to communicate with the bowel and accordingly I do not believe it to be an abscess. There is an anastomotic staple line near the anus and low position of the anorectal junction due to the considerable pelvic floor laxity. Vascular/Lymphatic: Aortoiliac atherosclerotic vascular disease.  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 Atherosclerotic narrowing of the superior mesenteric artery. No overt pathologic adenopathy. Reproductive: Uterus absent.  Adnexa unremarkable. Other: No supplemental non-categorized findings. Musculoskeletal: Prominent pelvic floor laxity as noted above. Grade 1 degenerative anterolisthesis at L3-4. Lumbar spondylosis and degenerative disc disease cause impingement at L3-4, L4-5, and L5-S1. IMPRESSION: 1. Moderate but focal acute sigmoid colon diverticulitis with diffuse colonic diverticulosis. 2. Multiple new bibasilar pulmonary nodules measuring up to 2.1 cm in long axis. Although the patient does seem to have a history of waxing and waning pulmonary nodules which could be from atypical infectious process, there are new hypoenhancing lesions in the liver and in the spleen along with splenomegaly and likely a splenic infarct. This raises concern for lymphoproliferative malignancy or other malignancy with metastatic disease to the liver and spleen. I recommend either nuclear medicine PET-CT, or further scanning with dedicated CT chest to provide complementary information. Sarcoidosis could also cause pulmonary, hepatic, and splenic lesions. 3. Considerable pelvic floor laxity with cystocele  and very low position of the anorectal junction, where there are postoperative findings. 4. Other imaging findings of potential clinical significance: Aortic Atherosclerosis (ICD10-I70.0). 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 Multilevel lower lumbar impingement. Benign-appearing left renal cysts. Calcification of the mitral valve. Electronically Signed   By: Van Clines M.D.   On: 09/30/2018 17:19   US Venous Img Lower Bilateral  Result Date: 09/30/2018 CLINICAL DATA:  Dyspnea.  Prior LEFT lower extremity DVT EXAM: BILATERAL LOWER EXTREMITY VENOUS DOPPLER ULTRASOUND TECHNIQUE: Gray-scale sonography with graded compression, as well as color Doppler and duplex ultrasound were performed to evaluate the lower extremity deep venous systems from the level of the common femoral vein and including the common femoral, femoral, profunda femoral, popliteal and calf veins including the posterior tibial, peroneal and gastrocnemius veins when visible. The superficial great saphenous vein was also interrogated. Spectral Doppler was utilized to evaluate flow at rest and with distal augmentation maneuvers in the common femoral, femoral and popliteal veins. COMPARISON:  12/25/2016 FINDINGS: RIGHT LOWER EXTREMITY Common Femoral Vein: No evidence of thrombus. Normal compressibility, respiratory phasicity and response to augmentation. Saphenofemoral Junction: No evidence of thrombus. Normal compressibility and flow on color Doppler imaging. Profunda Femoral Vein: No evidence of thrombus. Normal compressibility and flow on color Doppler imaging. Femoral Vein: No evidence of thrombus. Normal compressibility, respiratory phasicity and response to augmentation. Popliteal Vein: No evidence of thrombus. Normal compressibility, respiratory phasicity and response to augmentation. Calf Veins: No evidence of thrombus. Normal compressibility and flow on color Doppler imaging. Superficial Great Saphenous Vein: No  evidence of thrombus. Normal compressibility. Venous Reflux:  None. Other Findings:  None. LEFT LOWER EXTREMITY Common Femoral Vein: No evidence of thrombus. Normal compressibility, respiratory phasicity and response to augmentation. Saphenofemoral Junction: No evidence of thrombus. Normal compressibility and flow on color Doppler imaging. Profunda Femoral Vein: No evidence of thrombus. Normal compressibility and flow on color Doppler imaging. Femoral Vein: No evidence of thrombus. Normal compressibility, respiratory phasicity and response to augmentation. Popliteal Vein: No evidence of thrombus. Normal compressibility, respiratory phasicity and response to augmentation. Calf Veins: No evidence of thrombus. Normal compressibility and flow on color Doppler imaging. Superficial Great Saphenous Vein: No evidence of thrombus. Normal compressibility. Venous Reflux:  None. Other Findings:  None. IMPRESSION: No evidence of RIGHT or LEFT deep venous thrombosis. Electronically Signed   By: Staci Righter M.D.   On: 09/30/2018 19:31    ASSESSMENT: Pancytopenia, PE/DVT.  PLAN:  1. Pancytopenia: Patient's white blood cell count and hemoglobin continue to be decreased, but essentially stable.  Platelets are now within normal limits.  Previously, the remainder of her blood work including iron stores, B-12, folate, reticulocyte count, and hemolysis labs were all within normal limits. Antineutrophil antibodies are positive, but likely clinically insignificant.  No intervention is needed at this time.  Patient does not require bone marrow biopsy.  Return to clinic in 1 month with repeat laboratory work and further evaluation. 2.  Anemia: Patient's hemoglobin decreased to 9.2, but is now trending up and 10.7.  Her iron stores are essentially within normal limits.  No intervention is needed.  Return to clinic as above. 3.  History of pulmonary embolism, left popliteal DVT: Patient continues to take Eliquis, but now it appears  this is for her underlying cardiac issues.  Continue treatment and management per primary care. 4.  Diverticulitis: Patient recently finished antibiotics. 5.  Elevated ferritin: Likely secondary to acute phase reactant.  Monitor.   Patient expressed understanding and was in agreement with this plan. She also understands that She can call clinic at any time with any questions, concerns, or complaints.    Lloyd Huger, MD   10/25/2018 10:24 AM

## 2018-10-20 ENCOUNTER — Inpatient Hospital Stay: Payer: Medicare Other | Attending: Oncology | Admitting: Oncology

## 2018-10-20 ENCOUNTER — Other Ambulatory Visit: Payer: Self-pay

## 2018-10-20 ENCOUNTER — Inpatient Hospital Stay: Payer: Medicare Other

## 2018-10-20 VITALS — BP 118/69 | HR 91 | Temp 97.8°F | Wt 157.5 lb

## 2018-10-20 DIAGNOSIS — D61818 Other pancytopenia: Secondary | ICD-10-CM

## 2018-10-20 DIAGNOSIS — I10 Essential (primary) hypertension: Secondary | ICD-10-CM | POA: Insufficient documentation

## 2018-10-20 DIAGNOSIS — K5732 Diverticulitis of large intestine without perforation or abscess without bleeding: Secondary | ICD-10-CM | POA: Diagnosis not present

## 2018-10-20 DIAGNOSIS — Z86711 Personal history of pulmonary embolism: Secondary | ICD-10-CM | POA: Diagnosis not present

## 2018-10-20 DIAGNOSIS — I4892 Unspecified atrial flutter: Secondary | ICD-10-CM | POA: Insufficient documentation

## 2018-10-20 DIAGNOSIS — Z7901 Long term (current) use of anticoagulants: Secondary | ICD-10-CM

## 2018-10-20 DIAGNOSIS — K921 Melena: Secondary | ICD-10-CM | POA: Insufficient documentation

## 2018-10-20 DIAGNOSIS — Z79899 Other long term (current) drug therapy: Secondary | ICD-10-CM | POA: Insufficient documentation

## 2018-10-20 DIAGNOSIS — Z86718 Personal history of other venous thrombosis and embolism: Secondary | ICD-10-CM | POA: Insufficient documentation

## 2018-10-20 DIAGNOSIS — R7989 Other specified abnormal findings of blood chemistry: Secondary | ICD-10-CM | POA: Insufficient documentation

## 2018-10-20 LAB — CBC WITH DIFFERENTIAL/PLATELET
ABS IMMATURE GRANULOCYTES: 0.03 10*3/uL (ref 0.00–0.07)
BASOS ABS: 0 10*3/uL (ref 0.0–0.1)
BASOS PCT: 0 %
Eosinophils Absolute: 0 10*3/uL (ref 0.0–0.5)
Eosinophils Relative: 0 %
HCT: 36.6 % (ref 36.0–46.0)
Hemoglobin: 10.7 g/dL — ABNORMAL LOW (ref 12.0–15.0)
Immature Granulocytes: 1 %
Lymphocytes Relative: 13 %
Lymphs Abs: 0.4 10*3/uL — ABNORMAL LOW (ref 0.7–4.0)
MCH: 27.2 pg (ref 26.0–34.0)
MCHC: 29.2 g/dL — AB (ref 30.0–36.0)
MCV: 92.9 fL (ref 80.0–100.0)
MONO ABS: 0.2 10*3/uL (ref 0.1–1.0)
Monocytes Relative: 6 %
NEUTROS ABS: 2.4 10*3/uL (ref 1.7–7.7)
NRBC: 0 % (ref 0.0–0.2)
Neutrophils Relative %: 80 %
PLATELETS: 219 10*3/uL (ref 150–400)
RBC: 3.94 MIL/uL (ref 3.87–5.11)
RDW: 17 % — ABNORMAL HIGH (ref 11.5–15.5)
WBC: 3 10*3/uL — AB (ref 4.0–10.5)

## 2018-10-20 LAB — IRON AND TIBC
Iron: 31 ug/dL (ref 28–170)
Saturation Ratios: 13 % (ref 10.4–31.8)
TIBC: 236 ug/dL — AB (ref 250–450)
UIBC: 206 ug/dL

## 2018-10-20 LAB — FERRITIN: FERRITIN: 1199 ng/mL — AB (ref 11–307)

## 2018-10-20 NOTE — Progress Notes (Signed)
Patient is here to follow up on pancytopenia. Patient stated that for the last two days she has had a low grade fever of (100.6-100.8 F). Patient takes Tylenol and two hours later she tends to sweat the fever off. Patient states that for months she has had diarrhea, takes Imodium to help and then she gets back to her normal. Patient had noticed her stools being dark brown. Therefore, daughter stated that they sent a sample to her PCP and now they are waiting on the results. On 09/30/2018 patient went to the ED and was told that she had  diverticulitis and was given antibiotics. Patient now feels better after all that she had been through.

## 2018-10-26 ENCOUNTER — Other Ambulatory Visit: Payer: Self-pay | Admitting: *Deleted

## 2018-10-26 ENCOUNTER — Telehealth: Payer: Self-pay | Admitting: *Deleted

## 2018-10-26 ENCOUNTER — Encounter: Payer: Self-pay | Admitting: Oncology

## 2018-10-26 DIAGNOSIS — R918 Other nonspecific abnormal finding of lung field: Secondary | ICD-10-CM

## 2018-10-26 NOTE — Telephone Encounter (Signed)
Pts daughter called to follow up on lab results and pt having worsening fatigue and weakness. Per Dr. Grayland Ormond patients lab results are improving with exception of elevated ferritin which is most likely reactive. Dr. Grayland Ormond states her weakness and ferritin are most likely related to possible malignancy associated with pulmonary nodules and liver lesions. Pt and family have in the past denied desire to pursue further work up for pulmonary nodules. Pts daughter at this time states her mother would like an answer for how she feels. After discussion with Dr. Grayland Ormond, orders placed for PET scan to be performed prior to scheduled follow up on 12/23. Orders placed and schedule message sent.

## 2018-10-27 ENCOUNTER — Encounter: Payer: Self-pay | Admitting: Oncology

## 2018-10-29 ENCOUNTER — Encounter
Admission: RE | Admit: 2018-10-29 | Discharge: 2018-10-29 | Disposition: A | Discharge status: Home / Self Care | Payer: Medicare Other | Source: Ambulatory Visit | Attending: Oncology | Admitting: Oncology

## 2018-10-29 DIAGNOSIS — R918 Other nonspecific abnormal finding of lung field: Secondary | ICD-10-CM

## 2018-10-29 LAB — GLUCOSE, CAPILLARY: Glucose-Capillary: 92 mg/dL (ref 70–99)

## 2018-10-29 MED ORDER — FLUDEOXYGLUCOSE F - 18 (FDG) INJECTION
8.1000 | Freq: Once | INTRAVENOUS | Status: AC | PRN
  Administered 2018-10-29: 8.79 via INTRAVENOUS

## 2018-11-01 DIAGNOSIS — C799 Secondary malignant neoplasm of unspecified site: Secondary | ICD-10-CM | POA: Insufficient documentation

## 2018-11-01 NOTE — Progress Notes (Signed)
[facility]  [phone] [phone]) [phone]  ID: Pilar Grammes OB: 84  MR#: [id number]  UXN#:[id number]  Patient Care Team: Adin Hector, MD as PCP - General (Internal Medicine) Corey Skains, MD as Consulting Physician (Cardiology)  CHIEF COMPLAINT: Metastatic disease, unknown primary.  INTERVAL HISTORY: Patient returns to clinic today for further evaluation and discussion of her imaging results.  She has increasing weakness and fatigue and a declining performance status.  She has pain in her mid back secondary to a recent fall.  Her daughters report increased confusion.  She has no focal neurologic complaints.  She denies any chest pain, shortness of breath, cough, or hemoptysis.  She has a fair appetite and admits to increased weight loss.  She has increased nausea as well as intermittent constipation alternating with diarrhea.  Her abdomen feels distended.  She has noted no recent melena or hematochezia.  She has no urinary complaints.  Patient offers no further specific complaints today.  REVIEW OF SYSTEMS:   Review of Systems  Constitutional: Positive for fever, malaise/fatigue and weight loss.  Respiratory: Negative.  Negative for cough, hemoptysis and shortness of breath.   Cardiovascular: Negative.  Negative for chest pain and leg swelling.  Gastrointestinal: Positive for constipation, diarrhea and nausea. Negative for abdominal pain, blood in stool and melena.  Genitourinary: Negative.  Negative for hematuria.  Musculoskeletal: Positive for back pain.  Skin: Negative.  Negative for rash.  Neurological: Positive for weakness. Negative for sensory change, focal weakness and headaches.  Endo/Heme/Allergies: Does not bruise/bleed easily.  Psychiatric/Behavioral: Positive for memory loss. The patient is not nervous/anxious.     As per HPI. Otherwise, a complete review of systems is negative.  PAST MEDICAL HISTORY: Past Medical  History:  Diagnosis Date  . Atrial flutter ([place])    cardioverted  . Collagen vascular disease ([facility])    RA  . DVT (deep venous thrombosis) ([place])   . HLD (hyperlipidemia)   . Hypertension   . Hypothyroid   . Pulmonary embolism ([place])   . RA (rheumatoid arthritis) ([place])   . Sjogren's disease ([place])     PAST SURGICAL HISTORY: Past Surgical History:  Procedure Laterality Date  . ABDOMINAL HYSTERECTOMY    . APPENDECTOMY    . ASD REPAIR  age 84  . cataracts    . JOINT REPLACEMENT Left    elbow  . PATENT DUCTUS ARTERIOUS REPAIR      FAMILY HISTORY: Family History  Problem Relation Age of Onset  . Cancer Brother   . Stroke Mother   . Dementia Mother   . Kidney cancer Father   . Stroke Sister   . Cancer Sister   . Lung cancer Sister   . Liver cancer Sister     ADVANCED DIRECTIVES (Y/N):  N  HEALTH MAINTENANCE: Social History   Tobacco Use  . Smoking status: Never Smoker  . Smokeless tobacco: Never Used  Substance Use Topics  . Alcohol use: Yes    Comment: occasionally   . Drug use: No     Colonoscopy:  PAP:  Bone density:  Lipid panel:  Allergies  Allergen Reactions  . Ciprofloxacin Other (See Comments)    Foot burning; tolerates levaquin  . Methotrexate Other (See Comments)    leukopenia  . Gold-Containing Drug Products Rash  . Other Other (See Comments) and Rash  . Sulfa Antibiotics Rash    Current Outpatient Medications  Medication Sig Dispense Refill  . acetaminophen (TYLENOL) 500 MG  tablet Take 500 mg by mouth daily as needed for mild pain or moderate pain.    Marland Kitchen amitriptyline (ELAVIL) 10 MG tablet Take 20 mg by mouth at bedtime.    Marland Kitchen apixaban (ELIQUIS) 2.5 MG TABS tablet Take 2.5 mg by mouth 2 (two) times daily.    Marland Kitchen azelastine (ASTELIN) 0.1 % nasal spray Place 1 spray into both nostrils 2 (two) times daily. Use in each nostril as directed    . cephALEXin (KEFLEX) 500 MG capsule Take by mouth.    . cholecalciferol (VITAMIN D3) 25 MCG (1000 UT)  tablet Take 1,000 Units by mouth once a week.     . diclofenac sodium (VOLTAREN) 1 % GEL Apply 2 g topically 3 (three) times daily.    . folic acid (FOLVITE) 1 MG tablet Take 3 mg by mouth daily.     . furosemide (LASIX) 20 MG tablet Take 10 mg by mouth 2 (two) times daily.     . hydroxychloroquine (PLAQUENIL) 200 MG tablet Take 200 mg by mouth daily.     Marland Kitchen levothyroxine (SYNTHROID, LEVOTHROID) 50 MCG tablet Take 50 mcg by mouth daily before breakfast.    . lidocaine (LIDODERM) 5 % Place 1 patch onto the skin daily. (remove after 12 hours or as directed    . metoprolol succinate (TOPROL-XL) 12.5 mg TB24 24 hr tablet Take 12.5 mg by mouth daily.    . polyvinyl alcohol (LIQUIFILM TEARS) 1.4 % ophthalmic solution Place 1 drop into both eyes as needed for dry eyes.    . potassium chloride SA (K-DUR,KLOR-CON) 20 MEQ tablet Take 20 mEq by mouth daily.    . predniSONE (DELTASONE) 5 MG tablet Take 5 mg by mouth 2 (two) times daily with a meal.     . tiZANidine (ZANAFLEX) 2 MG tablet Take 2 mg by mouth 2 (two) times daily as needed for muscle spasms.    Marland Kitchen HYDROcodone-acetaminophen (NORCO/VICODIN) 5-325 MG tablet Take 1 tablet by mouth every 4 (four) hours as needed for moderate pain. 30 tablet 0  . pantoprazole (PROTONIX) 40 MG tablet Take 1 tablet (40 mg total) by mouth daily. 30 tablet 0  . prochlorperazine (COMPAZINE) 10 MG tablet Take 1 tablet (10 mg total) by mouth every 6 (six) hours as needed for nausea or vomiting. 30 tablet 0   No current facility-administered medications for this visit.     OBJECTIVE: There were no vitals filed for this visit.   There is no height or weight on file to calculate BMI.    ECOG FS:3 - Symptomatic, >50% confined to bed  General: Thin, no acute distress. Eyes: Pink conjunctiva, anicteric sclera. HEENT: Normocephalic, moist mucous membranes, clear oropharnyx. Lungs: Clear to auscultation bilaterally. Heart: Regular rate and rhythm. No rubs, murmurs, or  gallops. Abdomen: Mildly distended.  Nontender. Musculoskeletal: No edema, cyanosis, or clubbing.  No point tenderness in lumbar or thoracic spine. Neuro: Alert, answering all questions appropriately. Cranial nerves grossly intact. Skin: No rashes or petechiae noted. Psych: Normal affect.  LAB RESULTS:  Lab Results  Component Value Date   NA 136 10/02/2018   K 3.9 10/02/2018   CL 105 10/02/2018   CO2 25 10/02/2018   GLUCOSE 94 10/02/2018   BUN 17 10/02/2018   CREATININE 1.10 (H) 10/02/2018   CALCIUM 7.3 (L) 10/02/2018   PROT 6.5 06/20/2013   ALBUMIN 3.4 06/20/2013   AST 26 06/20/2013   ALT 20 06/20/2013   ALKPHOS 78 06/20/2013   BILITOT 0.6 06/20/2013  GFRNONAA 42 (L) 10/02/2018   GFRAA 49 (L) 10/02/2018    Lab Results  Component Value Date   WBC 3.0 (L) 10/20/2018   NEUTROABS 2.4 10/20/2018   HGB 10.7 (L) 10/20/2018   HCT 36.6 10/20/2018   MCV 92.9 10/20/2018   PLT 219 10/20/2018   Lab Results  Component Value Date   FERRITIN 1,199 (H) 10/20/2018   Lab Results  Component Value Date   IRON 31 10/20/2018   TIBC 236 (L) 10/20/2018   IRONPCTSAT 13 10/20/2018    STUDIES: Dg Thoracic Spine 2 View  Result Date: 11/04/2018 CLINICAL DATA:  Fall with left-sided back pain EXAM: THORACIC SPINE 2 VIEWS; LUMBAR SPINE - COMPLETE 4+ VIEW COMPARISON:  Chest radiograph 09/30/2018 FINDINGS: THORACIC SPINE: There is moderate height loss of T10 that appears new compared to the chest radiograph 09/30/2018. No other focal abnormality is visualized within the thoracic spine. Alignment of the thoracic spine is normal. LUMBAR SPINE: There is unchanged grade 1 anterolisthesis at L3-L4. No lumbar spine fracture. IMPRESSION: Mild-to-moderate height loss of T10, likely new compared to chest radiograph 09/30/2018 and concerning for fracture. MRI may be helpful for further assessment. These results will be called to the ordering clinician or representative by the Radiologist Assistant, and  communication documented in the PACS or zVision Dashboard. Electronically Signed   By: Ulyses Jarred M.D.   On: 11/04/2018 16:50   Dg Lumbar Spine Complete  Result Date: 11/04/2018 CLINICAL DATA:  Fall with left-sided back pain EXAM: THORACIC SPINE 2 VIEWS; LUMBAR SPINE - COMPLETE 4+ VIEW COMPARISON:  Chest radiograph 09/30/2018 FINDINGS: THORACIC SPINE: There is moderate height loss of T10 that appears new compared to the chest radiograph 09/30/2018. No other focal abnormality is visualized within the thoracic spine. Alignment of the thoracic spine is normal. LUMBAR SPINE: There is unchanged grade 1 anterolisthesis at L3-L4. No lumbar spine fracture. IMPRESSION: Mild-to-moderate height loss of T10, likely new compared to chest radiograph 09/30/2018 and concerning for fracture. MRI may be helpful for further assessment. These results will be called to the ordering clinician or representative by the Radiologist Assistant, and communication documented in the PACS or zVision Dashboard. Electronically Signed   By: Ulyses Jarred M.D.   On: 11/04/2018 16:50   Nm Pet Image Initial (pi) Skull Base To Thigh  Result Date: 10/29/2018 CLINICAL DATA:  Initial treatment strategy for pulmonary nodules. EXAM: NUCLEAR MEDICINE PET SKULL BASE TO THIGH TECHNIQUE: 8.79 mCi F-18 FDG was injected intravenously. Full-ring PET imaging was performed from the skull base to thigh after the radiotracer. CT data was obtained and used for attenuation correction and anatomic localization. Fasting blood glucose: 92 mg/dl COMPARISON:  CT scan 09/30/2018 FINDINGS: Mediastinal blood pool activity: SUV max 2.67 NECK: No hypermetabolic lymph nodes in the neck. Incidental CT findings: none CHEST: 23.5 mm right upper lobe pulmonary nodule on image number 70 is hypermetabolic with SUV max of 9.52. 20 mm superior segment left lower lobe pulmonary nodule on image number 80 is hypermetabolic with SUV max of 8.41. 17 mm left lower lobe nodule on  image number 324 is hypermetabolic with SUV max of 4.01. 17 mm right basilar nodule on image number 027 is hypermetabolic with SUV max of 2.53. Other small scattered pulmonary nodules are again noted. No enlarged or hypermetabolic mediastinal or hilar lymph nodes. No breast masses, supraclavicular or axillary adenopathy. Incidental CT findings: Stable emphysematous changes and pulmonary scarring. Stable advanced vascular calcifications with three-vessel coronary artery calcifications. ABDOMEN/PELVIS: The segment 5  liver lesion is hypermetabolic and appears larger but difficult to measure without contrast SUV max is 9.06. Smaller lesion in segment 6 is hypermetabolic with SUV max of 0.63. The spleen is diffusely hypermetabolic with multiple splenic lesions as demonstrated on the previous CT scan. No adrenal gland lesions.  No abdominal/pelvic lymphadenopathy. Incidental CT findings: Severe sigmoid colon diverticulosis without findings for acute diverticulitis. SKELETON: No findings for osseous metastatic disease. Incidental CT findings: none IMPRESSION: 1. Multiple hypermetabolic pulmonary nodules most consistent with metastatic disease. No enlarged or hypermetabolic mediastinal or hilar lymph nodes. 2. Hypermetabolic hepatic metastasis. 3. Enlarged and diffusely hypermetabolic spleen. 4. No abdominal/pelvic lymphadenopathy. 5. No obvious primary tumor is identified. A liver biopsy may be the safest option. Electronically Signed   By: Marijo Sanes M.D.   On: 10/29/2018 20:41    ASSESSMENT: Metastatic disease, unknown primary.  PLAN:    1. Metastatic disease, unknown primary: PET scan results from October 29, 2018 reviewed independently and reported as above with bilateral hypermetabolic pulmonary nodules as well as multiple hepatic metastasis.  After lengthy discussion with the patient, which included hospice and end-of-life care, she wishes to pursue a biopsy of her liver to obtain a diagnosis.  Patient  expressed understanding that given her declining performance status treatment would be difficult and not recommended.  Patient wishes to pursue biopsy despite this.  Appreciate palliative care input.  Return to clinic 1 week after her biopsy to discuss the results. 2.  Back pain: X-ray results reviewed independently with what appears to be a mild fracture.  Continue symptomatic treatment with hydrocodone as needed. 3.  Poor appetite/weight loss: Consider dietary referral. 4.  Pancytopenia: Patient's white blood cell count and hemoglobin are only mildly decreased.  Platelets are within normal limits. Previously, the remainder of her blood work including iron stores, B-12, folate, reticulocyte count, and hemolysis labs were all within normal limits. Antineutrophil antibodies are positive, but likely clinically insignificant.   No intervention needed.  Monitor. 5.  History of pulmonary embolism, left popliteal DVT: Patient continues to take Eliquis, but now it appears this is for her underlying cardiac issues.  Continue treatment and management per primary care.  Eliquis will need to be discontinued temporarily for liver biopsy. 6.  Diverticulitis: Patient recently finished antibiotics. 7.  Elevated ferritin: Likely secondary to acute phase reactant.  Monitor.   Patient expressed understanding and was in agreement with this plan. She also understands that She can call clinic at any time with any questions, concerns, or complaints.    Lloyd Huger, MD   11/06/2018 12:00 PM

## 2018-11-04 ENCOUNTER — Other Ambulatory Visit: Payer: Self-pay | Admitting: *Deleted

## 2018-11-04 ENCOUNTER — Inpatient Hospital Stay (HOSPITAL_BASED_OUTPATIENT_CLINIC_OR_DEPARTMENT_OTHER): Payer: Medicare Other | Admitting: Hospice and Palliative Medicine

## 2018-11-04 ENCOUNTER — Inpatient Hospital Stay: Payer: Medicare Other | Attending: Oncology | Admitting: Oncology

## 2018-11-04 ENCOUNTER — Ambulatory Visit
Admission: RE | Admit: 2018-11-04 | Discharge: 2018-11-04 | Disposition: A | Discharge status: Home / Self Care | Payer: Medicare Other | Source: Ambulatory Visit | Attending: Oncology | Admitting: Oncology

## 2018-11-04 ENCOUNTER — Other Ambulatory Visit: Payer: Self-pay | Admitting: Oncology

## 2018-11-04 ENCOUNTER — Other Ambulatory Visit: Payer: Self-pay

## 2018-11-04 ENCOUNTER — Inpatient Hospital Stay: Payer: Medicare Other

## 2018-11-04 DIAGNOSIS — G8929 Other chronic pain: Secondary | ICD-10-CM | POA: Diagnosis not present

## 2018-11-04 DIAGNOSIS — K5900 Constipation, unspecified: Secondary | ICD-10-CM

## 2018-11-04 DIAGNOSIS — Z7189 Other specified counseling: Secondary | ICD-10-CM

## 2018-11-04 DIAGNOSIS — Z79899 Other long term (current) drug therapy: Secondary | ICD-10-CM

## 2018-11-04 DIAGNOSIS — R52 Pain, unspecified: Secondary | ICD-10-CM

## 2018-11-04 DIAGNOSIS — Z515 Encounter for palliative care: Secondary | ICD-10-CM

## 2018-11-04 DIAGNOSIS — G893 Neoplasm related pain (acute) (chronic): Secondary | ICD-10-CM

## 2018-11-04 DIAGNOSIS — C799 Secondary malignant neoplasm of unspecified site: Secondary | ICD-10-CM

## 2018-11-04 DIAGNOSIS — R110 Nausea: Secondary | ICD-10-CM

## 2018-11-04 DIAGNOSIS — Z7901 Long term (current) use of anticoagulants: Secondary | ICD-10-CM

## 2018-11-04 DIAGNOSIS — E785 Hyperlipidemia, unspecified: Secondary | ICD-10-CM

## 2018-11-04 DIAGNOSIS — R5383 Other fatigue: Secondary | ICD-10-CM

## 2018-11-04 DIAGNOSIS — E860 Dehydration: Secondary | ICD-10-CM | POA: Diagnosis not present

## 2018-11-04 DIAGNOSIS — C801 Malignant (primary) neoplasm, unspecified: Secondary | ICD-10-CM

## 2018-11-04 DIAGNOSIS — M549 Dorsalgia, unspecified: Secondary | ICD-10-CM

## 2018-11-04 DIAGNOSIS — R531 Weakness: Secondary | ICD-10-CM | POA: Diagnosis not present

## 2018-11-04 DIAGNOSIS — Z86718 Personal history of other venous thrombosis and embolism: Secondary | ICD-10-CM

## 2018-11-04 DIAGNOSIS — R109 Unspecified abdominal pain: Secondary | ICD-10-CM

## 2018-11-04 DIAGNOSIS — R197 Diarrhea, unspecified: Secondary | ICD-10-CM

## 2018-11-04 DIAGNOSIS — J9601 Acute respiratory failure with hypoxia: Secondary | ICD-10-CM | POA: Diagnosis not present

## 2018-11-04 DIAGNOSIS — I4891 Unspecified atrial fibrillation: Secondary | ICD-10-CM

## 2018-11-04 MED ORDER — PROCHLORPERAZINE MALEATE 10 MG PO TABS: 10.0000 mg | ORAL_TABLET | Freq: Four times a day (QID) | ORAL | 0 refills | Status: AC | PRN

## 2018-11-04 MED ORDER — HYDROCODONE-ACETAMINOPHEN 5-325 MG PO TABS: 1.0000 | ORAL_TABLET | ORAL | 0 refills | Status: DC | PRN

## 2018-11-04 MED ORDER — PROCHLORPERAZINE MALEATE 10 MG PO TABS
10.0000 mg | ORAL_TABLET | Freq: Once | ORAL | Status: AC
  Administered 2018-11-04: 10 mg via ORAL
  Filled 2018-11-04: qty 1

## 2018-11-04 MED ORDER — HYDROCODONE-ACETAMINOPHEN 5-325 MG PO TABS
1.0000 | ORAL_TABLET | Freq: Once | ORAL | Status: AC
  Administered 2018-11-04: 1 via ORAL
  Filled 2018-11-04: qty 1

## 2018-11-04 MED ORDER — PANTOPRAZOLE SODIUM 40 MG PO TBEC: 40.0000 mg | DELAYED_RELEASE_TABLET | Freq: Every day | ORAL | 0 refills | Status: AC

## 2018-11-04 NOTE — Progress Notes (Signed)
Patient is here today pancytopenia and to go over PET Scan results. Patient stated that she is currently feeling weak, fatigued and tired. Patient stated that last Sunday (11-01-2018) in the AM, she fell by the side of the bed. Patient had been having fever but taking Tylenol. Patient's daughters stated that they contacted their mother's PCP and informed them that patient had fallen and PCP told them that if she was feeling worse in a couple of days, to let them know so they could order tests and X-Rays to make sure that she did not break any bones. Patient had also had nausea, vomiting, chills, back pain, distended abdomen, loss of appetite, not sleeping at all due to pain.

## 2018-11-04 NOTE — Progress Notes (Signed)
[place]  [phone] [phone]   Name: Carol Bryan Date: 11/04/2018 MRN: [id number]  DOB: [birth year]  Patient Care Team: Adin Hector, MD as PCP - General (Internal Medicine) Corey Skains, MD as Consulting Physician (Cardiology)    REASON FOR CONSULTATION: Palliative Care consult requested for this 84 y.o. female with multiple medical problems including a flutter status post cardioversion, RA, history of DVT/PE, Sjogren's disease, hypothyroidism.  Patient was recently hospitalized 09/30/2018 to 10/03/2018 with acute diverticulitis and was incidentally found to have multiple pulmonary, liver, and spleen nodules, all of which appeared hypermetabolic on subsequent PET scan.  Patient was referred to oncology.  Palliative care consult was requested to help address goals.   SOCIAL HISTORY:    Patient is widowed.  Her husband died in 11/17/17.  She lives at home with her daughters rotating care so that she is not alone.  She has 3 daughters, all of whom are involved in her care.  Patient retired from [employer].  ADVANCE DIRECTIVES:  Patient's daughter is her healthcare power of attorney.  She has a living will.  CODE STATUS:   PAST MEDICAL HISTORY: Past Medical History:  Diagnosis Date  . Atrial flutter ([place])    cardioverted  . Collagen vascular disease ([facility])    RA  . DVT (deep venous thrombosis) ([place])   . HLD (hyperlipidemia)   . Hypertension   . Hypothyroid   . Pulmonary embolism ([place])   . RA (rheumatoid arthritis) ([place])   . Sjogren's disease ([place])     PAST SURGICAL HISTORY:  Past Surgical History:  Procedure Laterality Date  . ABDOMINAL HYSTERECTOMY    . APPENDECTOMY    . ASD REPAIR  age 89  . cataracts    . JOINT REPLACEMENT Left    elbow  . PATENT DUCTUS ARTERIOUS REPAIR      HEMATOLOGY/ONCOLOGY HISTORY:   No history exists.    ALLERGIES:  is allergic to  ciprofloxacin; methotrexate; gold-containing drug products; other; and sulfa antibiotics.  MEDICATIONS:  Current Outpatient Medications  Medication Sig Dispense Refill  . acetaminophen (TYLENOL) 500 MG tablet Take 500 mg by mouth daily as needed for mild pain or moderate pain.    Marland Kitchen amitriptyline (ELAVIL) 10 MG tablet Take 20 mg by mouth at bedtime.    Marland Kitchen apixaban (ELIQUIS) 2.5 MG TABS tablet Take 2.5 mg by mouth 2 (two) times daily.    Marland Kitchen azelastine (ASTELIN) 0.1 % nasal spray Place 1 spray into both nostrils 2 (two) times daily. Use in each nostril as directed    . cephALEXin (KEFLEX) 500 MG capsule Take by mouth.    . cholecalciferol (VITAMIN D3) 25 MCG (1000 UT) tablet Take 1,000 Units by mouth once a week.     . diclofenac sodium (VOLTAREN) 1 % GEL Apply 2 g topically 3 (three) times daily.    . folic acid (FOLVITE) 1 MG tablet Take 3 mg by mouth daily.     . furosemide (LASIX) 20 MG tablet Take 10 mg by mouth 2 (two) times daily.     Marland Kitchen HYDROcodone-acetaminophen (NORCO/VICODIN) 5-325 MG tablet Take 1 tablet by mouth every 4 (four) hours as needed for moderate pain. 30 tablet 0  . hydroxychloroquine (PLAQUENIL) 200 MG tablet Take 200 mg by mouth daily.     Marland Kitchen levothyroxine (SYNTHROID, LEVOTHROID) 50 MCG tablet Take 50 mcg by mouth daily before breakfast.    . lidocaine (LIDODERM) 5 %  Place 1 patch onto the skin daily. (remove after 12 hours or as directed    . metoprolol succinate (TOPROL-XL) 12.5 mg TB24 24 hr tablet Take 12.5 mg by mouth daily.    . polyvinyl alcohol (LIQUIFILM TEARS) 1.4 % ophthalmic solution Place 1 drop into both eyes as needed for dry eyes.    . potassium chloride SA (K-DUR,KLOR-CON) 20 MEQ tablet Take 20 mEq by mouth daily.    . predniSONE (DELTASONE) 5 MG tablet Take 5 mg by mouth 2 (two) times daily with a meal.     . prochlorperazine (COMPAZINE) 10 MG tablet Take 1 tablet (10 mg total) by mouth every 6 (six) hours as needed for nausea or vomiting. 30 tablet 0  .  tiZANidine (ZANAFLEX) 2 MG tablet Take 2 mg by mouth 2 (two) times daily as needed for muscle spasms.     No current facility-administered medications for this visit.     VITAL SIGNS: There were no vitals taken for this visit. There were no vitals filed for this visit.  Estimated body mass index is 28.81 kg/m as calculated from the following:   Height as of 09/30/18: '5\' 2"'  (1.575 m).   Weight as of 10/20/18: 157 lb 8 oz (71.4 kg).  LABS: CBC:    Component Value Date/Time   WBC 3.0 (L) 10/20/2018 1119   HGB 10.7 (L) 10/20/2018 1119   HGB 12.5 07/27/2014 1505   HCT 36.6 10/20/2018 1119   HCT 38.0 07/27/2014 1505   PLT 219 10/20/2018 1119   PLT 194 07/27/2014 1505   MCV 92.9 10/20/2018 1119   MCV 92 07/27/2014 1505   NEUTROABS 2.4 10/20/2018 1119   LYMPHSABS 0.4 (L) 10/20/2018 1119   MONOABS 0.2 10/20/2018 1119   EOSABS 0.0 10/20/2018 1119   BASOSABS 0.0 10/20/2018 1119   Comprehensive Metabolic Panel:    Component Value Date/Time   NA 136 10/02/2018 0414   NA 140 06/20/2013 0605   K 3.9 10/02/2018 0414   K 3.4 (L) 06/20/2013 0605   CL 105 10/02/2018 0414   CL 105 06/20/2013 0605   CO2 25 10/02/2018 0414   CO2 29 06/20/2013 0605   BUN 17 10/02/2018 0414   BUN 25 (H) 06/20/2013 0605   CREATININE 1.10 (H) 10/02/2018 0414   CREATININE 1.10 06/20/2013 0605   GLUCOSE 94 10/02/2018 0414   GLUCOSE 114 (H) 06/20/2013 0605   CALCIUM 7.3 (L) 10/02/2018 0414   CALCIUM 8.6 06/20/2013 0605   AST 26 06/20/2013 0605   ALT 20 06/20/2013 0605   ALKPHOS 78 06/20/2013 0605   BILITOT 0.6 06/20/2013 0605   PROT 6.5 06/20/2013 0605   ALBUMIN 3.4 06/20/2013 0605    RADIOGRAPHIC STUDIES: Nm Pet Image Initial (pi) Skull Base To Thigh  Result Date: 10/29/2018 CLINICAL DATA:  Initial treatment strategy for pulmonary nodules. EXAM: NUCLEAR MEDICINE PET SKULL BASE TO THIGH TECHNIQUE: 8.79 mCi F-18 FDG was injected intravenously. Full-ring PET imaging was performed from the skull base to  thigh after the radiotracer. CT data was obtained and used for attenuation correction and anatomic localization. Fasting blood glucose: 92 mg/dl COMPARISON:  CT scan 09/30/2018 FINDINGS: Mediastinal blood pool activity: SUV max 2.67 NECK: No hypermetabolic lymph nodes in the neck. Incidental CT findings: none CHEST: 23.5 mm right upper lobe pulmonary nodule on image number 70 is hypermetabolic with SUV max of 9.21. 20 mm superior segment left lower lobe pulmonary nodule on image number 80 is hypermetabolic with SUV max of 1.94. 17 mm  left lower lobe nodule on image number 188 is hypermetabolic with SUV max of 4.16. 17 mm right basilar nodule on image number 606 is hypermetabolic with SUV max of 3.01. Other small scattered pulmonary nodules are again noted. No enlarged or hypermetabolic mediastinal or hilar lymph nodes. No breast masses, supraclavicular or axillary adenopathy. Incidental CT findings: Stable emphysematous changes and pulmonary scarring. Stable advanced vascular calcifications with three-vessel coronary artery calcifications. ABDOMEN/PELVIS: The segment 5 liver lesion is hypermetabolic and appears larger but difficult to measure without contrast SUV max is 9.06. Smaller lesion in segment 6 is hypermetabolic with SUV max of 6.01. The spleen is diffusely hypermetabolic with multiple splenic lesions as demonstrated on the previous CT scan. No adrenal gland lesions.  No abdominal/pelvic lymphadenopathy. Incidental CT findings: Severe sigmoid colon diverticulosis without findings for acute diverticulitis. SKELETON: No findings for osseous metastatic disease. Incidental CT findings: none IMPRESSION: 1. Multiple hypermetabolic pulmonary nodules most consistent with metastatic disease. No enlarged or hypermetabolic mediastinal or hilar lymph nodes. 2. Hypermetabolic hepatic metastasis. 3. Enlarged and diffusely hypermetabolic spleen. 4. No abdominal/pelvic lymphadenopathy. 5. No obvious primary tumor is  identified. A liver biopsy may be the safest option. Electronically Signed   By: Marijo Sanes M.D.   On: 10/29/2018 20:41    PERFORMANCE STATUS (ECOG) : 3 - Symptomatic, >50% confined to bed  Review of Systems As noted above. Otherwise, a complete review of systems is negative.  Physical Exam General: NAD, frail appearing, thin, in chair Cardiovascular: regular rate and rhythm Pulmonary: clear ant fields Abdomen: soft, nontender, + bowel sounds GU: no suprapubic tenderness Extremities: no edema Skin: no rashes Neurological: Weakness but otherwise nonfocal  IMPRESSION: I met today with patient and two daughters.  Patient also met today with Dr. Grayland Ormond.  Patient and family are considering whether to pursue biopsy versus hospice care.  Patient is able to relate to me her conversation with Dr. Grayland Ormond.  She says that she thinks she would be interested in biopsy and treatment.  However, daughter seemed less keen on the idea.  Patient says she does not want to focus on her dying and that the word hospice was frightening.  We talked at length about hospice and the services that they could provide in the home.  Patient had home health after she was last discharged from the hospital.  However, family are interested with more help in a longer term strategy for home resources.  They have hired a Actuary in the home.  Between the sitter and family, patient is receiving 24/7 care.  Family describe a pattern of progressive weakness and poor oral intake over the past month or two.  Patient has had increasing difficulty with standing without assistance.  She uses a walker to ambulate with help.  We discussed utilization of equipment in the home.  We also discussed home safety.  Patient has had a fall this past week and sustained bruising above her left eye and has had persistent back pain.  She is pending imaging of her back.  Patient would likely benefit from seeing our occupational therapist.  Patient  has had back pain since her fall.  She received a dose of Norco in the clinic today.  This seemed to help her pain.  A prescription was sent to her pharmacy by Dr. Grayland Ormond for as needed [facility].  Patient has some constipation.  Her last bowel movement was several days ago.  She is going to try MiraLAX and we also discussed the option  of Senokot as needed.  Patient is not eating well at home.  Family says she is eating mostly only bites per each meal.  She has tried Ensure but did not like the taste.  We talked about alternatives such as Theme park manager.  We discussed the importance of small, more frequent meals, focusing on high-protein and high-calorie foods.  She would likely benefit from seeing our dietitian.  Patient says she has some reflux, which has responded in the past to a PPI.  Patient is not currently taking a PPI.  Will order Protonix daily.  We discussed decision-making at length.  Patient's daughter is her healthcare power of attorney.  Patient also has a living will.  I reviewed with family the MOST Form but they wanted to take this home and discuss it more before completing it.  PLAN: Treatment plan per oncology.  Patient considering option of biopsy versus hospice. Norco PRN for pain Bowel regimen for constipation with daily MiraLAX and as needed Senokot Start Protonix 40 mg daily for GERD Compazine as needed for nausea Consider referral to RD/OT Most form to be completed at next visit RTC in 1 to 2 weeks   Patient expressed understanding and was in agreement with this plan. She also understands that She can call clinic at any time with any questions, concerns, or complaints.     Time Total: 30 minutes  Visit consisted of counseling and education dealing with the complex and emotionally intense issues of symptom management and palliative care in the setting of serious and potentially life-threatening illness.Greater than 50%  of this time was spent counseling  and coordinating care related to the above assessment and plan.  Signed by: Altha Harm, PhD, NP-C [phone] (Work Cell)

## 2018-11-05 ENCOUNTER — Telehealth: Payer: Self-pay | Admitting: *Deleted

## 2018-11-05 ENCOUNTER — Telehealth: Payer: Self-pay

## 2018-11-05 NOTE — Telephone Encounter (Signed)
Called report  IMPRESSION: Mild-to-moderate height loss of T10, likely new compared to chest radiograph 09/30/2018 and concerning for fracture. MRI may be helpful for further assessment.  These results will be called to the ordering clinician or representative by the Radiologist Assistant, and communication documented in the PACS or zVision Dashboard.   Electronically Signed   By: Ulyses Jarred M.D.   On: 11/04/2018 16:50

## 2018-11-05 NOTE — Telephone Encounter (Signed)
Dr. Nehemiah Massed gave [place] clearance for patient to stop taking Eliquis three days prior to her procedure. Contacted patient and spoke with daughter to let her know that her mother needed to stop Eliquis three days prior to procedure. She had no questions or concerns. I also faxed the clearance to specialty scheduling so they know.

## 2018-11-06 ENCOUNTER — Emergency Department: Payer: Medicare Other

## 2018-11-06 ENCOUNTER — Encounter: Payer: Self-pay | Admitting: Oncology

## 2018-11-06 ENCOUNTER — Other Ambulatory Visit: Payer: Self-pay

## 2018-11-06 ENCOUNTER — Inpatient Hospital Stay
Admission: EM | Admit: 2018-11-06 | Discharge: 2018-11-12 | DRG: 189 | Disposition: A | Discharge status: Hospice | Payer: Medicare Other | Attending: Specialist | Admitting: Specialist

## 2018-11-06 DIAGNOSIS — E860 Dehydration: Secondary | ICD-10-CM | POA: Diagnosis present

## 2018-11-06 DIAGNOSIS — Z86711 Personal history of pulmonary embolism: Secondary | ICD-10-CM

## 2018-11-06 DIAGNOSIS — J9600 Acute respiratory failure, unspecified whether with hypoxia or hypercapnia: Secondary | ICD-10-CM

## 2018-11-06 DIAGNOSIS — Z881 Allergy status to other antibiotic agents status: Secondary | ICD-10-CM

## 2018-11-06 DIAGNOSIS — E559 Vitamin D deficiency, unspecified: Secondary | ICD-10-CM | POA: Diagnosis present

## 2018-11-06 DIAGNOSIS — J9811 Atelectasis: Secondary | ICD-10-CM | POA: Diagnosis present

## 2018-11-06 DIAGNOSIS — Z96622 Presence of left artificial elbow joint: Secondary | ICD-10-CM | POA: Diagnosis present

## 2018-11-06 DIAGNOSIS — R630 Anorexia: Secondary | ICD-10-CM | POA: Diagnosis present

## 2018-11-06 DIAGNOSIS — Z7952 Long term (current) use of systemic steroids: Secondary | ICD-10-CM

## 2018-11-06 DIAGNOSIS — Z79899 Other long term (current) drug therapy: Secondary | ICD-10-CM

## 2018-11-06 DIAGNOSIS — E8770 Fluid overload, unspecified: Secondary | ICD-10-CM | POA: Diagnosis not present

## 2018-11-06 DIAGNOSIS — Z7901 Long term (current) use of anticoagulants: Secondary | ICD-10-CM

## 2018-11-06 DIAGNOSIS — Z888 Allergy status to other drugs, medicaments and biological substances status: Secondary | ICD-10-CM

## 2018-11-06 DIAGNOSIS — Z800 Family history of malignant neoplasm of digestive organs: Secondary | ICD-10-CM

## 2018-11-06 DIAGNOSIS — S0083XA Contusion of other part of head, initial encounter: Secondary | ICD-10-CM | POA: Diagnosis present

## 2018-11-06 DIAGNOSIS — J9601 Acute respiratory failure with hypoxia: Principal | ICD-10-CM | POA: Diagnosis present

## 2018-11-06 DIAGNOSIS — I700 Atherosclerosis of aorta: Secondary | ICD-10-CM | POA: Diagnosis present

## 2018-11-06 DIAGNOSIS — Z7951 Long term (current) use of inhaled steroids: Secondary | ICD-10-CM

## 2018-11-06 DIAGNOSIS — M069 Rheumatoid arthritis, unspecified: Secondary | ICD-10-CM | POA: Diagnosis present

## 2018-11-06 DIAGNOSIS — D735 Infarction of spleen: Secondary | ICD-10-CM | POA: Diagnosis present

## 2018-11-06 DIAGNOSIS — I4892 Unspecified atrial flutter: Secondary | ICD-10-CM | POA: Diagnosis present

## 2018-11-06 DIAGNOSIS — Z6825 Body mass index (BMI) 25.0-25.9, adult: Secondary | ICD-10-CM

## 2018-11-06 DIAGNOSIS — N3000 Acute cystitis without hematuria: Secondary | ICD-10-CM | POA: Diagnosis present

## 2018-11-06 DIAGNOSIS — R000 Tachycardia, unspecified: Secondary | ICD-10-CM | POA: Diagnosis present

## 2018-11-06 DIAGNOSIS — Z79891 Long term (current) use of opiate analgesic: Secondary | ICD-10-CM

## 2018-11-06 DIAGNOSIS — Z882 Allergy status to sulfonamides status: Secondary | ICD-10-CM

## 2018-11-06 DIAGNOSIS — W19XXXA Unspecified fall, initial encounter: Secondary | ICD-10-CM

## 2018-11-06 DIAGNOSIS — K5900 Constipation, unspecified: Secondary | ICD-10-CM | POA: Diagnosis present

## 2018-11-06 DIAGNOSIS — Z8051 Family history of malignant neoplasm of kidney: Secondary | ICD-10-CM

## 2018-11-06 DIAGNOSIS — Z66 Do not resuscitate: Secondary | ICD-10-CM | POA: Diagnosis present

## 2018-11-06 DIAGNOSIS — I728 Aneurysm of other specified arteries: Secondary | ICD-10-CM | POA: Diagnosis present

## 2018-11-06 DIAGNOSIS — C787 Secondary malignant neoplasm of liver and intrahepatic bile duct: Secondary | ICD-10-CM | POA: Diagnosis present

## 2018-11-06 DIAGNOSIS — E785 Hyperlipidemia, unspecified: Secondary | ICD-10-CM | POA: Diagnosis present

## 2018-11-06 DIAGNOSIS — E039 Hypothyroidism, unspecified: Secondary | ICD-10-CM | POA: Diagnosis present

## 2018-11-06 DIAGNOSIS — M810 Age-related osteoporosis without current pathological fracture: Secondary | ICD-10-CM | POA: Diagnosis present

## 2018-11-06 DIAGNOSIS — Z515 Encounter for palliative care: Secondary | ICD-10-CM

## 2018-11-06 DIAGNOSIS — W010XXA Fall on same level from slipping, tripping and stumbling without subsequent striking against object, initial encounter: Secondary | ICD-10-CM | POA: Diagnosis present

## 2018-11-06 DIAGNOSIS — R627 Adult failure to thrive: Secondary | ICD-10-CM | POA: Diagnosis present

## 2018-11-06 DIAGNOSIS — I129 Hypertensive chronic kidney disease with stage 1 through stage 4 chronic kidney disease, or unspecified chronic kidney disease: Secondary | ICD-10-CM | POA: Diagnosis present

## 2018-11-06 DIAGNOSIS — Z823 Family history of stroke: Secondary | ICD-10-CM

## 2018-11-06 DIAGNOSIS — N183 Chronic kidney disease, stage 3 (moderate): Secondary | ICD-10-CM | POA: Diagnosis present

## 2018-11-06 DIAGNOSIS — Z8711 Personal history of peptic ulcer disease: Secondary | ICD-10-CM

## 2018-11-06 DIAGNOSIS — Z86718 Personal history of other venous thrombosis and embolism: Secondary | ICD-10-CM

## 2018-11-06 DIAGNOSIS — Z801 Family history of malignant neoplasm of trachea, bronchus and lung: Secondary | ICD-10-CM

## 2018-11-06 DIAGNOSIS — Z90710 Acquired absence of both cervix and uterus: Secondary | ICD-10-CM

## 2018-11-06 DIAGNOSIS — C7801 Secondary malignant neoplasm of right lung: Secondary | ICD-10-CM | POA: Diagnosis present

## 2018-11-06 DIAGNOSIS — N179 Acute kidney failure, unspecified: Secondary | ICD-10-CM | POA: Diagnosis present

## 2018-11-06 DIAGNOSIS — Z79890 Hormone replacement therapy (postmenopausal): Secondary | ICD-10-CM

## 2018-11-06 DIAGNOSIS — M3500 Sicca syndrome, unspecified: Secondary | ICD-10-CM | POA: Diagnosis present

## 2018-11-06 HISTORY — DX: Malignant (primary) neoplasm, unspecified: C80.1

## 2018-11-06 LAB — BASIC METABOLIC PANEL
Anion gap: 11 (ref 5–15)
Anion gap: 8 (ref 5–15)
BUN: 29 mg/dL — ABNORMAL HIGH (ref 8–23)
BUN: 24 mg/dL — ABNORMAL HIGH (ref 8–23)
CO2: 23 mmol/L (ref 22–32)
CO2: 23 mmol/L (ref 22–32)
Calcium: 8.3 mg/dL — ABNORMAL LOW (ref 8.9–10.3)
Calcium: 7.4 mg/dL — ABNORMAL LOW (ref 8.9–10.3)
Chloride: 98 mmol/L (ref 98–111)
Chloride: 103 mmol/L (ref 98–111)
Creatinine, Ser: 1.41 mg/dL — ABNORMAL HIGH (ref 0.44–1.00)
Creatinine, Ser: 1.15 mg/dL — ABNORMAL HIGH (ref 0.44–1.00)
GFR calc Af Amer: 47 mL/min — ABNORMAL LOW (ref >60)
GFR calc non Af Amer: 41 mL/min — ABNORMAL LOW (ref >60)
GFR, EST AFRICAN AMERICAN: 37 mL/min — AB (ref >60)
GFR, EST NON AFRICAN AMERICAN: 32 mL/min — AB (ref >60)
Glucose, Bld: 121 mg/dL — ABNORMAL HIGH (ref 70–99)
Glucose, Bld: 99 mg/dL (ref 70–99)
Potassium: 4.4 mmol/L (ref 3.5–5.1)
Potassium: 4 mmol/L (ref 3.5–5.1)
SODIUM: 132 mmol/L — AB (ref 135–145)
Sodium: 134 mmol/L — ABNORMAL LOW (ref 135–145)

## 2018-11-06 LAB — CBC
HCT: 37.2 % (ref 36.0–46.0)
Hemoglobin: 11.1 g/dL — ABNORMAL LOW (ref 12.0–15.0)
MCH: 27.6 pg (ref 26.0–34.0)
MCHC: 29.8 g/dL — ABNORMAL LOW (ref 30.0–36.0)
MCV: 92.5 fL (ref 80.0–100.0)
Platelets: 198 10*3/uL (ref 150–400)
RBC: 4.02 MIL/uL (ref 3.87–5.11)
RDW: 16.9 % — ABNORMAL HIGH (ref 11.5–15.5)
WBC: 3.8 10*3/uL — ABNORMAL LOW (ref 4.0–10.5)
nRBC: 0 % (ref 0.0–0.2)

## 2018-11-06 LAB — TROPONIN I
TROPONIN I: 0.05 ng/mL — AB (ref <0.03)
Troponin I: 0.06 ng/mL (ref <0.03)

## 2018-11-06 LAB — URINALYSIS, COMPLETE (UACMP) WITH MICROSCOPIC
Bilirubin Urine: NEGATIVE
Glucose, UA: NEGATIVE mg/dL
Hgb urine dipstick: NEGATIVE
Ketones, ur: 5 mg/dL — AB
Nitrite: NEGATIVE
Protein, ur: NEGATIVE mg/dL
Specific Gravity, Urine: 1.011 (ref 1.005–1.030)
pH: 7 (ref 5.0–8.0)

## 2018-11-06 MED ORDER — HYDROCODONE-ACETAMINOPHEN 5-325 MG PO TABS
0.5000 | ORAL_TABLET | Freq: Once | ORAL | Status: AC
  Administered 2018-11-06: 0.5 via ORAL
  Filled 2018-11-06: qty 1

## 2018-11-06 MED ORDER — FOLIC ACID 1 MG PO TABS
3.0000 mg | ORAL_TABLET | Freq: Every day | ORAL | Status: DC
  Administered 2018-11-08 – 2018-11-11 (×4): 3 mg via ORAL
  Filled 2018-11-06 (×4): qty 3

## 2018-11-06 MED ORDER — AMITRIPTYLINE HCL 10 MG PO TABS
20.0000 mg | ORAL_TABLET | Freq: Every day | ORAL | Status: DC
  Administered 2018-11-07 – 2018-11-11 (×5): 20 mg via ORAL
  Filled 2018-11-06 (×8): qty 2

## 2018-11-06 MED ORDER — HYDROXYCHLOROQUINE SULFATE 200 MG PO TABS
200.0000 mg | ORAL_TABLET | Freq: Every day | ORAL | Status: DC
  Administered 2018-11-08 – 2018-11-12 (×5): 200 mg via ORAL
  Filled 2018-11-06 (×6): qty 1

## 2018-11-06 MED ORDER — ACETAMINOPHEN 500 MG PO TABS
500.0000 mg | ORAL_TABLET | Freq: Every day | ORAL | Status: DC | PRN
  Administered 2018-11-06: 500 mg via ORAL
  Filled 2018-11-06: qty 1

## 2018-11-06 MED ORDER — LACTULOSE 10 GM/15ML PO SOLN
15.0000 g | Freq: Once | ORAL | Status: AC
  Administered 2018-11-06: 15 g via ORAL
  Filled 2018-11-06: qty 30

## 2018-11-06 MED ORDER — LEVOTHYROXINE SODIUM 50 MCG PO TABS
50.0000 ug | ORAL_TABLET | Freq: Every day | ORAL | Status: DC
  Administered 2018-11-08 – 2018-11-12 (×5): 50 ug via ORAL
  Filled 2018-11-06 (×5): qty 1

## 2018-11-06 MED ORDER — APIXABAN 2.5 MG PO TABS
2.5000 mg | ORAL_TABLET | Freq: Two times a day (BID) | ORAL | Status: DC
  Filled 2018-11-06 (×4): qty 1

## 2018-11-06 MED ORDER — GLYCERIN (LAXATIVE) 2.1 G RE SUPP
1.0000 | Freq: Once | RECTAL | Status: AC
  Administered 2018-11-06: 1 via RECTAL
  Filled 2018-11-06 (×4): qty 1

## 2018-11-06 MED ORDER — METOPROLOL SUCCINATE ER 25 MG PO TB24
12.5000 mg | ORAL_TABLET | Freq: Every day | ORAL | Status: DC
  Administered 2018-11-08 – 2018-11-12 (×5): 12.5 mg via ORAL
  Filled 2018-11-06 (×4): qty 1
  Filled 2018-11-06: qty 0.5
  Filled 2018-11-06: qty 1

## 2018-11-06 MED ORDER — SODIUM CHLORIDE 0.9 % IV BOLUS
1000.0000 mL | Freq: Once | INTRAVENOUS | Status: AC
  Administered 2018-11-06: 1000 mL via INTRAVENOUS

## 2018-11-06 MED ORDER — LEVOFLOXACIN 500 MG PO TABS: 500.0000 mg | ORAL_TABLET | Freq: Every day | ORAL | Status: DC

## 2018-11-06 MED ORDER — POTASSIUM CHLORIDE CRYS ER 20 MEQ PO TBCR
20.0000 meq | EXTENDED_RELEASE_TABLET | Freq: Every day | ORAL | Status: DC
  Administered 2018-11-08 – 2018-11-10 (×3): 20 meq via ORAL
  Filled 2018-11-06 (×4): qty 1

## 2018-11-06 MED ORDER — PANTOPRAZOLE SODIUM 40 MG PO TBEC
40.0000 mg | DELAYED_RELEASE_TABLET | Freq: Every day | ORAL | Status: DC
  Administered 2018-11-08 – 2018-11-12 (×5): 40 mg via ORAL
  Filled 2018-11-06 (×5): qty 1

## 2018-11-06 MED ORDER — SODIUM CHLORIDE 0.9 % IV SOLN
1.0000 g | Freq: Once | INTRAVENOUS | Status: AC
  Administered 2018-11-06: 1 g via INTRAVENOUS
  Filled 2018-11-06: qty 10

## 2018-11-06 NOTE — ED Triage Notes (Signed)
Pt arrived via ems from home where family noted increasing weakness and lethargy starting around noon today. Pt A&O smiling on arrival. Pt does not complain of any pain. Hx of a fib, liver, and lung CA and artifical left elbow. Pt NAD at present moment.

## 2018-11-06 NOTE — Discharge Instructions (Addendum)
Please schedule an appointment to see your primary care physician on Monday.  They will check you for signs of dehydration, and recheck your kidney function and your sodium level.  Please make sure you are drinking plenty of clear liquids over the weekend to prevent dehydration.  Return to the emergency department if you develop severe pain, lightheadedness or fainting, inability to keep down fluids, fever, or any other symptoms concerning to you.

## 2018-11-06 NOTE — ED Provider Notes (Addendum)
[facility] Emergency Department Provider Note  ____________________________________________  Time seen: Approximately 4:57 PM  I have reviewed the triage vital signs and the nursing notes.   HISTORY  Chief Complaint Weakness    HPI Carol Bryan is a 84 y.o. female hx of HTN, HL, history of cardioverted atrial flutter and PE on Eliquis, liver and lung cancer not currently under treatment, presenting 5 days after fall.  The patient reports that she was at home on Monday when she lost her balance and fell, striking her face resulting in bruising over the left zygomatic arch.  She denies any loss of consciousness.  She has no neck or back pain, and denies any additional injuries.  She has not had any headache, nausea or vomiting, visual or speech changes, mental status changes, numbness tingling or weakness.  She has been at her baseline, but her daughter saw her today and recommended that she come to the emergency department for work-up.  Past Medical History:  Diagnosis Date  . Atrial flutter ([place])    cardioverted  . Collagen vascular disease (HCC)    RA  . DVT (deep venous thrombosis) ([place])   . HLD (hyperlipidemia)   . Hypertension   . Hypothyroid   . Pulmonary embolism ([place])   . RA (rheumatoid arthritis) ([place])   . Sjogren's disease [facility])     Patient Active Problem List   Diagnosis Date Noted  . Metastatic disease ([place]) 11/01/2018  . Acute diverticulitis 09/30/2018  . Acute-on-chronic kidney injury ([place]) 06/08/2018  . SVT (supraventricular tachycardia) ([place]) 10/29/2017  . Other pancytopenia ([place]) 09/24/2017  . Arthropathy 08/20/2017  . Diverticulosis 08/20/2017  . Edema 08/20/2017  . Heart murmur 08/20/2017  . Osteoporosis, postmenopausal 08/20/2017  . Pleural thickening 08/20/2017  . Rheumatoid arthritis involving multiple sites with positive rheumatoid factor ([place]) 08/20/2017  . Sjogren's syndrome ([place]) 08/20/2017  . Vitamin D deficiency,  unspecified 08/20/2017  . Acute deep vein thrombosis (DVT) of popliteal vein of left lower extremity ([place]) 01/14/2017  . Pulmonary embolism ([place]) 12/24/2016  . Essential hypertension 12/24/2016  . Hyperlipidemia, unspecified 12/24/2016  . Hypothyroidism 12/24/2016  . Rheumatoid arteritis ([place]) 12/24/2016  . History of pulmonary embolism 12/24/2016  . Senile purpura ([place]) 08/19/2016  . Aortic atherosclerosis ([place]) 08/08/2016  . B12 deficiency 07/23/2016  . Hypokalemia 07/23/2016  . Neutropenia ([place]) 01/23/2016  . Abnormal positron emission tomography (PET) scan 11/01/2015  . Incidental lung nodule 07/27/2015  . CKD (chronic kidney disease) stage 3, GFR 30-59 ml/min (HCC) 07/19/2015  . Splenic artery aneurysm ([place]) 07/19/2015  . Osteoarthritis 07/20/2014  . Hypothyroidism due to acquired atrophy of thyroid 03/06/2012  . Peptic ulcer disease 03/06/2012  . S/P PDA repair 03/06/2012  . Encounter for long-term (current) use of medications 10/29/2011  . Carpal tunnel syndrome of right wrist 10/28/2011  . Cervical spondylosis 10/28/2011    Past Surgical History:  Procedure Laterality Date  . ABDOMINAL HYSTERECTOMY    . APPENDECTOMY    . ASD REPAIR  age 79  . cataracts    . JOINT REPLACEMENT Left    elbow  . PATENT DUCTUS ARTERIOUS REPAIR      Current Outpatient Rx  . Order #: [id number] Class: Historical Med  . Order #: [id number] Class: Historical Med  . Order #: [id number] Class: Historical Med  . Order #: [id number] Class: Historical Med  . Order #: [id number] Class: Historical Med  . Order #: [id number] Class: Historical Med  . Order #: [id number] Class: Historical Med  . Order #: [id number] Class:  Historical Med  . Order #: [id number] Class: Historical Med  . Order #: [id number] Class: Normal  . Order #: [id number] Class: Historical Med  . Order #: [id number] Class: Historical Med  . Order #: [id number] Class: Historical Med  . Order #: [id number] Class: Historical Med  . Order #:  [id number] Class: Normal  . Order #: [id number] Class: Historical Med  . Order #: [id number] Class: Historical Med  . Order #: [id number] Class: Historical Med  . Order #: [id number] Class: Normal  . Order #: [id number] Class: Historical Med    Allergies Ciprofloxacin; Methotrexate; Gold-containing drug products; Other; and Sulfa antibiotics  Family History  Problem Relation Age of Onset  . Cancer Brother   . Stroke Mother   . Dementia Mother   . Kidney cancer Father   . Stroke Sister   . Cancer Sister   . Lung cancer Sister   . Liver cancer Sister     Social History Social History   Tobacco Use  . Smoking status: Never Smoker  . Smokeless tobacco: Never Used  Substance Use Topics  . Alcohol use: Yes    Comment: occasionally   . Drug use: No    Review of Systems Constitutional: No fever/chills.  Positive mechanical fall.  No loss of consciousness.  No lightheadedness or syncope. Eyes: No visual changes.  No blurred or double vision. ENT: No sore throat. No congestion or rhinorrhea. Cardiovascular: Denies chest pain. Denies palpitations. Respiratory: Denies shortness of breath.  No cough. Gastrointestinal: No abdominal pain.  No nausea, no vomiting.  No diarrhea.  No constipation. Genitourinary: Negative for dysuria. Musculoskeletal: Negative for back pain.  No extremity injuries.  No neck pain. Skin: Negative for rash. Neurological: Negative for headaches. No focal numbness, tingling or weakness.  No changes in vision, speech or mental status.  No difficulty walking.    ____________________________________________   PHYSICAL EXAM:  VITAL SIGNS: ED Triage Vitals  Enc Vitals Group     BP 11/06/18 1643 117/77     Pulse Rate 11/06/18 1643 (!) 115     Resp 11/06/18 1643 20     Temp 11/06/18 1643 97.6 F (36.4 C)     Temp Source 11/06/18 1643 Oral     SpO2 11/06/18 1643 95 %     Weight 11/06/18 1649 154 lb (69.9 kg)     Height 11/06/18 1649 5\' 5"  (1.651 m)     Head  Circumference --      Peak Flow --      Pain Score 11/06/18 1649 0     Pain Loc --      Pain Edu? --      Excl. in [place]? --     Constitutional: The patient is alert, oriented to year but states it is November.  She otherwise answers questions appropriately.  GCS is 15. Eyes: Conjunctivae are normal.  EOMI. PERRLA.  No scleral icterus.  No raccoon eyes. Head: The patient has old bruising around the left orbit and involving mostly the zygomatic arch with downward movement likely from gravitational pull. no battle sign.. Nose: No congestion/rhinnorhea.  No swelling over the nose or septal hematoma. Mouth/Throat: Mucous membranes are dry.  No dental injury or malocclusion. Neck: No stridor.  Supple.  No midline C-spine tenderness to palpation, step-offs or deformities. Cardiovascular: Normal rate, regular rhythm. No murmurs, rubs or gallops.  Respiratory: Normal respiratory effort.  No accessory muscle use or retractions. Lungs CTAB.  No wheezes, rales or ronchi. Gastrointestinal: Soft, nontender and nondistended.  No guarding or rebound.  No peritoneal  signs. Musculoskeletal: No LE edema. No ttp in the calves or palpable cords.  Negative Homan's sign.  Pelvis is stable and the patient is full range of motion of her extremities without pain.  No midline thoracic or lumbar spine tenderness to palpation, step-offs or deformities. Neurologic: Alert, does not know the month but otherwise oriented.  Speech is clear.  Face and smile are symmetric.  EOMI.  Moves all extremities well. Skin:  Skin is warm, dry and intact. No rash noted. Psychiatric: Mood and affect are normal.   ____________________________________________   LABS (all labs ordered are listed, but only abnormal results are displayed)  Labs Reviewed  URINALYSIS, COMPLETE (UACMP) WITH MICROSCOPIC - Abnormal; Notable for the following components:      Result Value   Color, Urine YELLOW (*)    APPearance HAZY (*)    Ketones, ur 5 (*)     Leukocytes, UA TRACE (*)    Bacteria, UA MANY (*)    All other components within normal limits  CBC - Abnormal; Notable for the following components:   WBC 3.8 (*)    Hemoglobin 11.1 (*)    MCHC 29.8 (*)    RDW 16.9 (*)    All other components within normal limits  BASIC METABOLIC PANEL - Abnormal; Notable for the following components:   Sodium 132 (*)    Glucose, Bld 121 (*)    BUN 29 (*)    Creatinine, Ser 1.41 (*)    Calcium 8.3 (*)    GFR calc non Af Amer 32 (*)    GFR calc Af Amer 37 (*)    All other components within normal limits  TROPONIN I - Abnormal; Notable for the following components:   Troponin I 0.05 (*)    All other components within normal limits  BASIC METABOLIC PANEL - Abnormal; Notable for the following components:   Sodium 134 (*)    BUN 24 (*)    Creatinine, Ser 1.15 (*)    Calcium 7.4 (*)    GFR calc non Af Amer 41 (*)    GFR calc Af Amer 47 (*)    All other components within normal limits  URINE CULTURE  TROPONIN I   ____________________________________________  EKG  ED ECG REPORT I, Anne-Caroline Mariea Clonts, the attending physician, personally viewed and interpreted this ECG.   Date: 11/06/2018  EKG Time: 1648  Rate: 115  Rhythm: sinus tachycardia  Axis: leftward  Intervals:none  ST&T Change: No STEMI   ____________________________________________  RADIOLOGY  Dg Chest 2 View  Result Date: 11/06/2018 CLINICAL DATA:  Increasing weakness. Liver lung metastases of unknown primary. EXAM: CHEST - 2 VIEW COMPARISON:  Thoracic spine x-ray November 04, 2018 FINDINGS: A nodule or mass in the right upper lobe is again identified. A torturous thoracic aorta is stable. The heart, hila, and mediastinum are unchanged. No pneumothorax. No focal infiltrate. Minimal atelectasis in the left base. The suspected compression fracture of a lower thoracic vertebral bodies seen on the November 04, 2018 thoracic spine x-ray is unchanged. IMPRESSION: 1. Again noted is  a lower thoracic vertebral body compression fracture, thought to be new on the November 04, 2018 comparison. 2. Persistent nodule/mass in the right upper lung. Electronically Signed   By: Dorise Bullion III M.D   On: 11/06/2018 17:24   Ct Head Wo Contrast  Result Date: 11/06/2018 CLINICAL DATA:  Increasing weakness and lethargy today. History of atrial fibrillation and lung cancer. EXAM: CT HEAD WITHOUT CONTRAST TECHNIQUE: Contiguous axial  images were obtained from the base of the skull through the vertex without intravenous contrast. COMPARISON:  CT head 05/12/2017.  PET-CT 10/29/2018. FINDINGS: Brain: There is no evidence of acute intracranial hemorrhage, mass lesion, brain edema or extra-axial fluid collection. There is atrophy with diffuse prominence of the ventricles and subarachnoid spaces. There is chronic low-density in the periventricular white matter and chronic symmetric calcifications of the basal ganglia. There is no CT evidence of acute cortical infarction. Vascular: Extensive intracranial vascular calcifications. No hyperdense vessel identified. Skull: Negative for fracture or focal lesion. Sinuses/Orbits: The visualized paranasal sinuses and mastoid air cells are clear. No orbital abnormalities are seen. Other: None. IMPRESSION: Stable head CT without acute intracranial findings. Stable atrophy and chronic small vessel ischemic changes. Electronically Signed   By: Richardean Sale M.D.   On: 11/06/2018 17:33    ____________________________________________   PROCEDURES  Procedure(s) performed: None  Procedures  Critical Care performed: No ____________________________________________   INITIAL IMPRESSION / ASSESSMENT AND PLAN / ED COURSE  Pertinent labs & imaging results that were available during my care of the patient were reviewed by me and considered in my medical decision making (see chart for details).  84 y.o. female with a history of cardioverted a flutter and PE on  Eliquis, multiple other chronic medical comorbidities, presenting for fall 5 days ago.  Overall, the patient is well-appearing.  She is mildly tachycardic with dry mucous membranes; she may be dehydrated and I will treat her with intravenous fluids.  Also check a urinalysis, basic laboratory studies, troponin, and get a CT of the head and a chest x-ray as well for further evaluation.  Plan reevaluation for final disposition.  ----------------------------------------- 6:28 PM on 11/06/2018 -----------------------------------------  The patient continues to be hemodynamically stable.  Her urinalysis is pending.  Her first troponin is 0.05; after reviewing her chart, she does tend to have troponins that are 0.03-0.04.  We will repeat this troponin, but without any ischemic changes and no chest pain, shortness of breath, lightheadedness or syncope, this is likely not indicative of ACS or MI.  I will also plan to repeat her basic metabolic panel after intravenous fluids to ensure that her hyponatremia and renal insufficiency are improving prior to discharge.  ----------------------------------------- 10:09 PM on 11/06/2018 -----------------------------------------  After intravenous fluids, the patient's creatinine has improved significantly and her sodium is trending upwards.  She is feeling better.  She will receive ceftriaxone for her UTI and a urinary culture has been sent.  Her daughters have now arrived and are telling me that she has been on Keflex for 1 week, and so I will change her antibiotic as she obviously continues to have an infection.  Her daughters are also concerned that the patient is no longer able to walk and even with 4 adults helping at the house, they are unable to care for her.  I have initiated a physical therapy consult and a social work consult for placement.  ____________________________________________  FINAL CLINICAL IMPRESSION(S) / ED DIAGNOSES  Final diagnoses:   Contusion of face, initial encounter  Fall, initial encounter  Dehydration  Acute cystitis without hematuria         NEW MEDICATIONS STARTED DURING THIS VISIT:  New Prescriptions   No medications on file      Eula Listen, MD 11/06/18 1832    Eula Listen, MD 11/06/18 2210

## 2018-11-07 ENCOUNTER — Emergency Department: Payer: Medicare Other

## 2018-11-07 ENCOUNTER — Inpatient Hospital Stay: Payer: Medicare Other

## 2018-11-07 DIAGNOSIS — E785 Hyperlipidemia, unspecified: Secondary | ICD-10-CM | POA: Diagnosis present

## 2018-11-07 DIAGNOSIS — E039 Hypothyroidism, unspecified: Secondary | ICD-10-CM | POA: Diagnosis present

## 2018-11-07 DIAGNOSIS — M3500 Sicca syndrome, unspecified: Secondary | ICD-10-CM | POA: Diagnosis present

## 2018-11-07 DIAGNOSIS — N179 Acute kidney failure, unspecified: Secondary | ICD-10-CM | POA: Diagnosis present

## 2018-11-07 DIAGNOSIS — Z6825 Body mass index (BMI) 25.0-25.9, adult: Secondary | ICD-10-CM | POA: Diagnosis not present

## 2018-11-07 DIAGNOSIS — J9600 Acute respiratory failure, unspecified whether with hypoxia or hypercapnia: Secondary | ICD-10-CM | POA: Diagnosis present

## 2018-11-07 DIAGNOSIS — C787 Secondary malignant neoplasm of liver and intrahepatic bile duct: Secondary | ICD-10-CM | POA: Diagnosis present

## 2018-11-07 DIAGNOSIS — W010XXA Fall on same level from slipping, tripping and stumbling without subsequent striking against object, initial encounter: Secondary | ICD-10-CM | POA: Diagnosis present

## 2018-11-07 DIAGNOSIS — M069 Rheumatoid arthritis, unspecified: Secondary | ICD-10-CM | POA: Diagnosis present

## 2018-11-07 DIAGNOSIS — Z86711 Personal history of pulmonary embolism: Secondary | ICD-10-CM | POA: Diagnosis not present

## 2018-11-07 DIAGNOSIS — Z96622 Presence of left artificial elbow joint: Secondary | ICD-10-CM | POA: Diagnosis present

## 2018-11-07 DIAGNOSIS — E860 Dehydration: Secondary | ICD-10-CM | POA: Diagnosis not present

## 2018-11-07 DIAGNOSIS — Z888 Allergy status to other drugs, medicaments and biological substances status: Secondary | ICD-10-CM | POA: Diagnosis not present

## 2018-11-07 DIAGNOSIS — R630 Anorexia: Secondary | ICD-10-CM | POA: Diagnosis present

## 2018-11-07 DIAGNOSIS — Z86718 Personal history of other venous thrombosis and embolism: Secondary | ICD-10-CM | POA: Diagnosis not present

## 2018-11-07 DIAGNOSIS — Z823 Family history of stroke: Secondary | ICD-10-CM | POA: Diagnosis not present

## 2018-11-07 DIAGNOSIS — J9811 Atelectasis: Secondary | ICD-10-CM | POA: Diagnosis present

## 2018-11-07 DIAGNOSIS — N3000 Acute cystitis without hematuria: Secondary | ICD-10-CM | POA: Diagnosis present

## 2018-11-07 DIAGNOSIS — R627 Adult failure to thrive: Secondary | ICD-10-CM | POA: Diagnosis present

## 2018-11-07 DIAGNOSIS — Z90710 Acquired absence of both cervix and uterus: Secondary | ICD-10-CM | POA: Diagnosis not present

## 2018-11-07 DIAGNOSIS — Z801 Family history of malignant neoplasm of trachea, bronchus and lung: Secondary | ICD-10-CM | POA: Diagnosis not present

## 2018-11-07 DIAGNOSIS — Z881 Allergy status to other antibiotic agents status: Secondary | ICD-10-CM | POA: Diagnosis not present

## 2018-11-07 DIAGNOSIS — Z515 Encounter for palliative care: Secondary | ICD-10-CM | POA: Diagnosis not present

## 2018-11-07 DIAGNOSIS — C7801 Secondary malignant neoplasm of right lung: Secondary | ICD-10-CM | POA: Diagnosis present

## 2018-11-07 DIAGNOSIS — Z882 Allergy status to sulfonamides status: Secondary | ICD-10-CM | POA: Diagnosis not present

## 2018-11-07 DIAGNOSIS — J9601 Acute respiratory failure with hypoxia: Secondary | ICD-10-CM | POA: Diagnosis not present

## 2018-11-07 DIAGNOSIS — Z7901 Long term (current) use of anticoagulants: Secondary | ICD-10-CM | POA: Diagnosis not present

## 2018-11-07 DIAGNOSIS — I4892 Unspecified atrial flutter: Secondary | ICD-10-CM | POA: Diagnosis present

## 2018-11-07 LAB — PROTIME-INR
INR: 1.43
Prothrombin Time: 17.3 seconds — ABNORMAL HIGH (ref 11.4–15.2)

## 2018-11-07 LAB — CBC
HCT: 35.1 % — ABNORMAL LOW (ref 36.0–46.0)
HEMOGLOBIN: 10.5 g/dL — AB (ref 12.0–15.0)
MCH: 27.7 pg (ref 26.0–34.0)
MCHC: 29.9 g/dL — AB (ref 30.0–36.0)
MCV: 92.6 fL (ref 80.0–100.0)
Platelets: 179 10*3/uL (ref 150–400)
RBC: 3.79 MIL/uL — ABNORMAL LOW (ref 3.87–5.11)
RDW: 17.2 % — ABNORMAL HIGH (ref 11.5–15.5)
WBC: 3.7 10*3/uL — ABNORMAL LOW (ref 4.0–10.5)
nRBC: 0 % (ref 0.0–0.2)

## 2018-11-07 LAB — HEPATIC FUNCTION PANEL
ALT: 14 U/L (ref 0–44)
AST: 25 U/L (ref 15–41)
Albumin: 2.4 g/dL — ABNORMAL LOW (ref 3.5–5.0)
Alkaline Phosphatase: 149 U/L — ABNORMAL HIGH (ref 38–126)
Bilirubin, Direct: 0.1 mg/dL (ref 0.0–0.2)
Indirect Bilirubin: 0.6 mg/dL (ref 0.3–0.9)
Total Bilirubin: 0.7 mg/dL (ref 0.3–1.2)
Total Protein: 6.3 g/dL — ABNORMAL LOW (ref 6.5–8.1)

## 2018-11-07 LAB — APTT: aPTT: 44 seconds — ABNORMAL HIGH (ref 24–36)

## 2018-11-07 LAB — HEPARIN LEVEL (UNFRACTIONATED): Heparin Unfractionated: 2.42 IU/mL — ABNORMAL HIGH (ref 0.30–0.70)

## 2018-11-07 MED ORDER — MAGNESIUM CITRATE PO SOLN
1.0000 | Freq: Once | ORAL | Status: AC
  Administered 2018-11-07: 1 via ORAL
  Filled 2018-11-07: qty 296

## 2018-11-07 MED ORDER — HYDROCODONE-ACETAMINOPHEN 5-325 MG PO TABS
1.0000 | ORAL_TABLET | ORAL | Status: DC | PRN
  Administered 2018-11-07: 18:00:00 2 via ORAL
  Administered 2018-11-07: 13:00:00 1 via ORAL
  Administered 2018-11-08 – 2018-11-09 (×4): 2 via ORAL
  Administered 2018-11-10 – 2018-11-11 (×4): 1 via ORAL
  Filled 2018-11-07 (×3): qty 2
  Filled 2018-11-07 (×2): qty 1
  Filled 2018-11-07 (×2): qty 2
  Filled 2018-11-07 (×3): qty 1

## 2018-11-07 MED ORDER — ONDANSETRON HCL 4 MG PO TABS: 4.0000 mg | ORAL_TABLET | Freq: Four times a day (QID) | ORAL | Status: DC | PRN

## 2018-11-07 MED ORDER — SODIUM CHLORIDE 0.9 % IV SOLN
Freq: Once | INTRAVENOUS | Status: AC
  Administered 2018-11-07: 10:00:00 via INTRAVENOUS

## 2018-11-07 MED ORDER — IOPAMIDOL (ISOVUE-300) INJECTION 61%
30.0000 mL | Freq: Once | INTRAVENOUS | Status: AC | PRN
  Administered 2018-11-07: 30 mL via ORAL

## 2018-11-07 MED ORDER — AZELASTINE HCL 0.1 % NA SOLN
1.0000 | Freq: Two times a day (BID) | NASAL | Status: DC | PRN
  Filled 2018-11-07: qty 30

## 2018-11-07 MED ORDER — VITAMIN D 25 MCG (1000 UNIT) PO TABS
1000.0000 [IU] | ORAL_TABLET | Freq: Every day | ORAL | Status: DC
  Administered 2018-11-07 – 2018-11-11 (×5): 1000 [IU] via ORAL
  Filled 2018-11-07 (×5): qty 1

## 2018-11-07 MED ORDER — METHYLNALTREXONE BROMIDE 12 MG/0.6ML ~~LOC~~ SOLN
12.0000 mg | Freq: Once | SUBCUTANEOUS | Status: AC
  Administered 2018-11-07: 13:00:00 12 mg via SUBCUTANEOUS
  Filled 2018-11-07: qty 0.6

## 2018-11-07 MED ORDER — POLYVINYL ALCOHOL 1.4 % OP SOLN
1.0000 [drp] | OPHTHALMIC | Status: DC | PRN
  Filled 2018-11-07: qty 15

## 2018-11-07 MED ORDER — ONDANSETRON HCL 4 MG/2ML IJ SOLN
INTRAMUSCULAR | Status: AC
  Administered 2018-11-07: 03:00:00
  Filled 2018-11-07: qty 2

## 2018-11-07 MED ORDER — ACETAMINOPHEN 325 MG PO TABS: 650.0000 mg | ORAL_TABLET | Freq: Four times a day (QID) | ORAL | Status: DC | PRN

## 2018-11-07 MED ORDER — MORPHINE SULFATE (PF) 2 MG/ML IV SOLN
2.0000 mg | Freq: Once | INTRAVENOUS | Status: AC
  Administered 2018-11-07: 2 mg via INTRAVENOUS
  Filled 2018-11-07: qty 1

## 2018-11-07 MED ORDER — PREDNISONE 10 MG PO TABS
5.0000 mg | ORAL_TABLET | Freq: Two times a day (BID) | ORAL | Status: DC
  Administered 2018-11-07 – 2018-11-12 (×10): 5 mg via ORAL
  Filled 2018-11-07 (×10): qty 1

## 2018-11-07 MED ORDER — ONDANSETRON HCL 4 MG/2ML IJ SOLN: 4.0000 mg | Freq: Four times a day (QID) | INTRAMUSCULAR | Status: DC | PRN

## 2018-11-07 MED ORDER — HYDROMORPHONE HCL 1 MG/ML IJ SOLN: 1.0000 mg | INTRAMUSCULAR | Status: DC | PRN

## 2018-11-07 MED ORDER — IOHEXOL 300 MG/ML  SOLN
75.0000 mL | Freq: Once | INTRAMUSCULAR | Status: AC | PRN
  Administered 2018-11-07: 75 mL via INTRAVENOUS

## 2018-11-07 MED ORDER — HEPARIN (PORCINE) 25000 UT/250ML-% IV SOLN
800.0000 [IU]/h | INTRAVENOUS | Status: DC
  Administered 2018-11-07: 1000 [IU]/h via INTRAVENOUS
  Filled 2018-11-07 (×2): qty 250

## 2018-11-07 MED ORDER — PROCHLORPERAZINE MALEATE 10 MG PO TABS
10.0000 mg | ORAL_TABLET | Freq: Four times a day (QID) | ORAL | Status: DC | PRN
  Filled 2018-11-07: qty 1

## 2018-11-07 MED ORDER — ACETAMINOPHEN 650 MG RE SUPP: 650.0000 mg | Freq: Four times a day (QID) | RECTAL | Status: DC | PRN

## 2018-11-07 MED ORDER — SODIUM CHLORIDE 0.9 % IV SOLN
INTRAVENOUS | Status: DC
  Administered 2018-11-07 (×2): via INTRAVENOUS

## 2018-11-07 MED ORDER — DOCUSATE SODIUM 50 MG/5ML PO LIQD
100.0000 mg | ORAL | Status: AC
  Administered 2018-11-07: 100 mg via ORAL
  Filled 2018-11-07 (×2): qty 10

## 2018-11-07 NOTE — ED Provider Notes (Signed)
I assumed care of this patient from Dr. Les Pou at 7 AM.  Patient presented with generalized weakness and lethargy that started yesterday.  According to patient's daughters she was recently diagnosed this week with metastatic cancer.  Has not initiated treatment for it.  Until yesterday patient was walking with a walker and living at home.  At this time patient's health has deteriorated significantly.  She will open her eyes and answer a few questions and fall right back to sleep.  She remains persistently tachycardic even after IV fluids, has a new oxygen requirement of 2 L, she is tachypneic.  She underwent extensive work-up which showed mild AKI which has now resolved after fluids, CT head was negative for stroke or intracranial hemorrhage.  Patient has no unilateral localizing neurological deficits.  CT of the abdomen was done due to abdominal pain which was found to show metastatic disease and constipation.  Patient has received several treatments for her constipation in the emergency room by Dr. Karma Greaser.  No CT of the chest was done to rule out a PE in the setting of metastatic disease.  Patient is on Eliquis but with hypoxia, tachypnea, and tachycardia I am concerned she could have a PE.  Patient has received a load of contrast this morning and therefore is unable to receive a second dose especially due to her kidney dysfunction.  I will order a VQ scan and get her admitted to the hospitalist service for further evaluation.  Dr. Posey Pronto is currently in the room evaluating patient for admission.  Social work has met with family and will involve palliative care and hospice.   Rudene Re, MD 11/07/18 [id number]

## 2018-11-07 NOTE — Progress Notes (Signed)
Advanced care plan.  Purpose of the Encounter: CODE STATUS  Parties in Attendance: Patient herself and 2 daughters  Patient's Decision Capacity: Not intact  Subjective/Patient's story:  Patient is a 84 year old with newly diagnosis metastatic cancer with unknown primary who is brought to the hospital with respiratory failure weakness Objective/Medical story  Discussed with the family regarding overall poor prognosis and changes in her condition likely related to her cancer  Goals of care determination:   DNR  CODE STATUS: DNR   Time spent discussing advanced care planning: 16 minutes

## 2018-11-07 NOTE — ED Notes (Signed)
Pt unable to swallow medications at this time. MD notified. MD Alfred Levins aware that home medications will be held at this time. This RN will continue to monitor.

## 2018-11-07 NOTE — ED Notes (Signed)
Admitting doctor at bedside 

## 2018-11-07 NOTE — Progress Notes (Signed)
[place] for heparin Indication: pulmonary embolus  Allergies  Allergen Reactions  . Ciprofloxacin Other (See Comments)    Foot burning; tolerates levaquin  . Methotrexate Other (See Comments)    leukopenia  . Gold-Containing Drug Products Rash  . Other Other (See Comments) and Rash  . Sulfa Antibiotics Rash    Patient Measurements: Height: 5\' 5"  (165.1 cm) Weight: 154 lb (69.9 kg) IBW/kg (Calculated) : 57 Heparin Dosing Weight: 69.9 kg  Vital Signs: BP: 109/67 (12/14 0930) Pulse Rate: 112 (12/14 0930)  Labs: Recent Labs    11/06/18 1653 11/06/18 2124  HGB 11.1*  --   HCT 37.2  --   PLT 198  --   CREATININE 1.41* 1.15*  TROPONINI 0.05* 0.06*    Estimated Creatinine Clearance: 30 mL/min (A) (by C-G formula based on SCr of 1.15 mg/dL (H)).   Medical History: Past Medical History:  Diagnosis Date  . Atrial flutter ([place])    cardioverted  . Cancer [facility])    recent dx'd with Lung and Liver cancer per daughter.  . Collagen vascular disease ([place])    RA  . DVT (deep venous thrombosis) ([place])   . HLD (hyperlipidemia)   . Hypertension   . Hypothyroid   . Pulmonary embolism ([place])   . RA (rheumatoid arthritis) ([place])   . Sjogren's disease [facility])    Assessment: 84 year old female admitted with weakness and lethargy. Also had hypoxia, tachypnea and tachycardia with concern for PE. Patient takes Eliquis 2.5 mg BID PTA with last dose 12/14 at 0800. Pharmacy consulted for heparin drip for suspected PE.  Per Dr. Posey Pronto, will start heparin drip this evening at time of next dose of Eliquis (2000). Patient is unable to take PO medications at this time so we can not restart Eliquis at this time.  Goal of Therapy:  Heparin level 0.3-0.7 units/ml aPTT 66-102 seconds Monitor platelets by anticoagulation protocol: Yes   Plan:  Baseline labs ordered. Will order heparin drip this afternoon to start tonight at 2000.  Pharmacy to continue to  follow.  Tawnya Crook, PharmD Pharmacy Resident  11/07/2018 11:46 AM

## 2018-11-07 NOTE — NC FL2 (Signed)
[place] LEVEL OF CARE SCREENING TOOL     IDENTIFICATION  Patient Name: Carol Bryan Birthdate: 08/10/1923 Sex: female Admission Date (Current Location): 11/06/2018  [place] and [place] Number:  Engineering geologist and Address:  [facility], [address] [place],  [address]      Provider Number: [id number]  Attending Physician Name and Address:  No att. providers found  Relative Name and Phone Number:  Julian Reil [phone]      Current Level of Care: Hospital Recommended Level of Care: [place] Prior Approval Number:    Date Approved/Denied:   PASRR Number:  [id number]  Discharge Plan: SNF    Current Diagnoses: Patient Active Problem List   Diagnosis Date Noted  . Metastatic disease ([place]) 11/01/2018  . Acute diverticulitis 09/30/2018  . Acute-on-chronic kidney injury ([place]) 06/08/2018  . SVT (supraventricular tachycardia) ([place]) 10/29/2017  . Other pancytopenia ([place]) 09/24/2017  . Arthropathy 08/20/2017  . Diverticulosis 08/20/2017  . Edema 08/20/2017  . Heart murmur 08/20/2017  . Osteoporosis, postmenopausal 08/20/2017  . Pleural thickening 08/20/2017  . Rheumatoid arthritis involving multiple sites with positive rheumatoid factor ([place]) 08/20/2017  . Sjogren's syndrome ([place]) 08/20/2017  . Vitamin D deficiency, unspecified 08/20/2017  . Acute deep vein thrombosis (DVT) of popliteal vein of left lower extremity ([place]) 01/14/2017  . Pulmonary embolism ([place]) 12/24/2016  . Essential hypertension 12/24/2016  . Hyperlipidemia, unspecified 12/24/2016  . Hypothyroidism 12/24/2016  . Rheumatoid arteritis ([place]) 12/24/2016  . History of pulmonary embolism 12/24/2016  . Senile purpura ([place]) 08/19/2016  . Aortic atherosclerosis ([place]) 08/08/2016  . B12 deficiency 07/23/2016  . Hypokalemia 07/23/2016  . Neutropenia ([place]) 01/23/2016  . Abnormal positron emission tomography (PET) scan 11/01/2015  .  Incidental lung nodule 07/27/2015  . CKD (chronic kidney disease) stage 3, GFR 30-59 ml/min (HCC) 07/19/2015  . Splenic artery aneurysm ([place]) 07/19/2015  . Osteoarthritis 07/20/2014  . Hypothyroidism due to acquired atrophy of thyroid 03/06/2012  . Peptic ulcer disease 03/06/2012  . S/P PDA repair 03/06/2012  . Encounter for long-term (current) use of medications 10/29/2011  . Carpal tunnel syndrome of right wrist 10/28/2011  . Cervical spondylosis 10/28/2011    Orientation RESPIRATION BLADDER Height & Weight     Self, Situation  O2(2 liters) Incontinent Weight: 154 lb (69.9 kg) Height:  5\' 5"  (165.1 cm)  BEHAVIORAL SYMPTOMS/MOOD NEUROLOGICAL BOWEL NUTRITION STATUS      Incontinent Diet(soft)  AMBULATORY STATUS COMMUNICATION OF NEEDS Skin   Extensive Assist Verbally Normal                       Personal Care Assistance Level of Assistance  Bathing, Feeding, Dressing, Total care Bathing Assistance: Limited assistance Feeding assistance: Limited assistance Dressing Assistance: Limited assistance Total Care Assistance: Limited assistance   Functional Limitations Info  Sight, Hearing, Speech Sight Info: Adequate Hearing Info: Adequate Speech Info: Adequate    SPECIAL CARE FACTORS FREQUENCY  PT (By licensed PT), OT (By licensed OT)     PT Frequency: x5 OT Frequency: x5            Contractures Contractures Info: Not present    Additional Factors Info  Code Status, Allergies   Allergies Info: Ciprofloxacin, Methotrexate, Gold-containing Drug Products, Other, Sulfa Antibiotics           Current Medications (11/07/2018):  This is the current hospital active medication list Current Facility-Administered Medications  Medication Dose Route Frequency Provider Last Rate Last  Dose  . 0.9 %  sodium chloride infusion   Intravenous Once Alfred Levins, [place], MD      . acetaminophen (TYLENOL) tablet 500 mg  500 mg Oral Daily PRN Eula Listen, MD   500 mg at  11/06/18 2256  . amitriptyline (ELAVIL) tablet 20 mg  20 mg Oral QHS Eula Listen, MD      . apixaban Arne [place]) tablet 2.5 mg  2.5 mg Oral BID Eula Listen, MD      . folic acid (FOLVITE) tablet 3 mg  3 mg Oral Daily Eula Listen, MD   Stopped at 11/07/18 [phone]  . hydroxychloroquine (PLAQUENIL) tablet 200 mg  200 mg Oral Daily Eula Listen, MD      . levofloxacin (LEVAQUIN) tablet 500 mg  500 mg Oral Daily Eula Listen, MD      . levothyroxine (SYNTHROID, LEVOTHROID) tablet 50 mcg  50 mcg Oral QAC breakfast Eula Listen, MD      . metoprolol succinate (TOPROL-XL) 24 hr tablet 12.5 mg  12.5 mg Oral Daily Eula Listen, MD      . pantoprazole (PROTONIX) EC tablet 40 mg  40 mg Oral Daily Mariea Clonts, [place], MD      . potassium chloride SA (K-DUR,KLOR-CON) CR tablet 20 mEq  20 mEq Oral Daily Eula Listen, MD   Stopped at 11/07/18 0901   Current Outpatient Medications  Medication Sig Dispense Refill  . acetaminophen (TYLENOL) 500 MG tablet Take 500 mg by mouth daily as needed for mild pain or moderate pain.    Marland Kitchen amitriptyline (ELAVIL) 10 MG tablet Take 20 mg by mouth at bedtime.    Marland Kitchen apixaban (ELIQUIS) 2.5 MG TABS tablet Take 2.5 mg by mouth 2 (two) times daily.    Marland Kitchen azelastine (ASTELIN) 0.1 % nasal spray Place 1 spray into both nostrils 2 (two) times daily. Use in each nostril as directed    . cholecalciferol (VITAMIN D3) 25 MCG (1000 UT) tablet Take 1,000 Units by mouth daily.     . folic acid (FOLVITE) 1 MG tablet Take 3 mg by mouth daily.     . furosemide (LASIX) 20 MG tablet Take 10 mg by mouth 2 (two) times daily.     Marland Kitchen HYDROcodone-acetaminophen (NORCO/VICODIN) 5-325 MG tablet Take 1 tablet by mouth every 4 (four) hours as needed for moderate pain. 30 tablet 0  . hydroxychloroquine (PLAQUENIL) 200 MG tablet Take 200 mg by mouth daily.     Marland Kitchen levothyroxine (SYNTHROID, LEVOTHROID) 50 MCG tablet Take 50 mcg by mouth daily  before breakfast.    . metoprolol succinate (TOPROL-XL) 12.5 mg TB24 24 hr tablet Take 12.5 mg by mouth daily.    . pantoprazole (PROTONIX) 40 MG tablet Take 1 tablet (40 mg total) by mouth daily. 30 tablet 0  . polyvinyl alcohol (LIQUIFILM TEARS) 1.4 % ophthalmic solution Place 1 drop into both eyes as needed for dry eyes.    . potassium chloride SA (K-DUR,KLOR-CON) 20 MEQ tablet Take 20 mEq by mouth daily.    . predniSONE (DELTASONE) 5 MG tablet Take 5 mg by mouth 2 (two) times daily with a meal.     . prochlorperazine (COMPAZINE) 10 MG tablet Take 1 tablet (10 mg total) by mouth every 6 (six) hours as needed for nausea or vomiting. 30 tablet 0  . diclofenac sodium (VOLTAREN) 1 % GEL Apply 2 g topically 3 (three) times daily.    Marland Kitchen lidocaine (LIDODERM) 5 % Place 1 patch onto the skin daily. (remove after 12 hours  or as directed    . tiZANidine (ZANAFLEX) 2 MG tablet Take 2 mg by mouth 2 (two) times daily as needed for muscle spasms.       Discharge Medications: Please see discharge summary for a list of discharge medications.  Relevant Imaging Results:  Relevant Lab Results:   Additional Information [id number]  Joana Reamer, [place]

## 2018-11-07 NOTE — H&P (Signed)
[facility] [place] NAME: Carol Bryan    MR#:  [id number]  DATE OF BIRTH:  24-Apr-1923  DATE OF ADMISSION:  11/06/2018  PRIMARY CARE PHYSICIAN: Adin Hector, MD   REQUESTING/REFERRING PHYSICIAN: Rudene Re, MD  CHIEF COMPLAINT:   Chief Complaint  Patient presents with  . Weakness    HISTORY OF PRESENT ILLNESS: Carol Bryan  is a 84 y.o. female with a known history of atrial flutter, recently diagnosed metastatic cancer with unknown primary to the lungs and liver, history of DVT and pulmonary embolism, rheumatoid arthritis, essential hypertension, hypothyroidism who was hospitalized recently with diverticulitis.  Patient also was noted to have lesions in her liver and lung.  Further work-up revealed her to have likely metastatic cancer.  PET scan confirmed this.  She is in the process of having this worked up when she started becoming very weak.  Patient was brought to the emergency room yesterday unable to ambulate.  She was also having severe abdominal pain therefore had a CT scan of the abdomen as well.  Patient overnight started having elevated heart rate as well as hypoxia requiring oxygen now.  Patient currently is drowsy.  2 daughters are at bedside.  PAST MEDICAL HISTORY:   Past Medical History:  Diagnosis Date  . Atrial flutter ([place])    cardioverted  . Cancer [facility])    recent dx'd with Lung and Liver cancer per daughter.  . Collagen vascular disease ([place])    RA  . DVT (deep venous thrombosis) ([place])   . HLD (hyperlipidemia)   . Hypertension   . Hypothyroid   . Pulmonary embolism ([place])   . RA (rheumatoid arthritis) ([place])   . Sjogren's disease ([place])     PAST SURGICAL HISTORY:  Past Surgical History:  Procedure Laterality Date  . ABDOMINAL HYSTERECTOMY    . APPENDECTOMY    . ASD REPAIR  age 73  . cataracts    . JOINT REPLACEMENT Left    elbow  . PATENT DUCTUS ARTERIOUS REPAIR      SOCIAL HISTORY:  Social  History   Tobacco Use  . Smoking status: Never Smoker  . Smokeless tobacco: Never Used  Substance Use Topics  . Alcohol use: Yes    Comment: occasionally     FAMILY HISTORY:  Family History  Problem Relation Age of Onset  . Cancer Brother   . Stroke Mother   . Dementia Mother   . Kidney cancer Father   . Stroke Sister   . Cancer Sister   . Lung cancer Sister   . Liver cancer Sister     DRUG ALLERGIES:  Allergies  Allergen Reactions  . Ciprofloxacin Other (See Comments)    Foot burning; tolerates levaquin  . Methotrexate Other (See Comments)    leukopenia  . Gold-Containing Drug Products Rash  . Other Other (See Comments) and Rash  . Sulfa Antibiotics Rash    REVIEW OF SYSTEMS:   CONSTITUTIONAL: Limited due to patient unable to provide any review of systems MEDICATIONS AT HOME:  Prior to Admission medications   Medication Sig Start Date End Date Taking? Authorizing Provider  acetaminophen (TYLENOL) 500 MG tablet Take 500 mg by mouth daily as needed for mild pain or moderate pain.   Yes [provider]  amitriptyline (ELAVIL) 10 MG tablet Take 20 mg by mouth at bedtime.   Yes [provider]  apixaban (ELIQUIS) 2.5 MG TABS tablet Take 2.5 mg by mouth  2 (two) times daily.   Yes [provider]  azelastine (ASTELIN) 0.1 % nasal spray Place 1 spray into both nostrils 2 (two) times daily. Use in each nostril as directed   Yes [provider]  cholecalciferol (VITAMIN D3) 25 MCG (1000 UT) tablet Take 1,000 Units by mouth daily.    Yes [provider]  folic acid (FOLVITE) 1 MG tablet Take 3 mg by mouth daily.  01/30/16  Yes [provider]  furosemide (LASIX) 20 MG tablet Take 10 mg by mouth 2 (two) times daily.    Yes [provider]  HYDROcodone-acetaminophen (NORCO/VICODIN) 5-325 MG tablet Take 1 tablet by mouth every 4 (four) hours as needed for moderate pain. 11/04/18  Yes Lloyd Huger, MD   hydroxychloroquine (PLAQUENIL) 200 MG tablet Take 200 mg by mouth daily.  12/05/17  Yes [provider]  levothyroxine (SYNTHROID, LEVOTHROID) 50 MCG tablet Take 50 mcg by mouth daily before breakfast.   Yes [provider]  metoprolol succinate (TOPROL-XL) 12.5 mg TB24 24 hr tablet Take 12.5 mg by mouth daily.   Yes [provider]  pantoprazole (PROTONIX) 40 MG tablet Take 1 tablet (40 mg total) by mouth daily. 11/04/18  Yes Borders, Kirt Boys, NP  polyvinyl alcohol (LIQUIFILM TEARS) 1.4 % ophthalmic solution Place 1 drop into both eyes as needed for dry eyes.   Yes [provider]  potassium chloride SA (K-DUR,KLOR-CON) 20 MEQ tablet Take 20 mEq by mouth daily.   Yes [provider]  predniSONE (DELTASONE) 5 MG tablet Take 5 mg by mouth 2 (two) times daily with a meal.    Yes [provider]  prochlorperazine (COMPAZINE) 10 MG tablet Take 1 tablet (10 mg total) by mouth every 6 (six) hours as needed for nausea or vomiting. 11/04/18  Yes Lloyd Huger, MD  diclofenac sodium (VOLTAREN) 1 % GEL Apply 2 g topically 3 (three) times daily.    [provider]  lidocaine (LIDODERM) 5 % Place 1 patch onto the skin daily. (remove after 12 hours or as directed 09/09/18   [provider]  tiZANidine (ZANAFLEX) 2 MG tablet Take 2 mg by mouth 2 (two) times daily as needed for muscle spasms. 09/07/18   [provider]      PHYSICAL EXAMINATION:   VITAL SIGNS: Blood pressure 115/74, pulse (!) 114, temperature 97.6 F (36.4 C), temperature source Oral, resp. rate (!) 28, height 5\' 5"  (1.651 m), weight 69.9 kg, SpO2 100 %.  GENERAL:  84 y.o.-year-old patient lying in the bed acutely ill-appearing.  EYES: Pupils equal, round, reactive to light and accommodation. No scleral icterus. Extraocular muscles intact.  HEENT: Head atraumatic, normocephalic. Oropharynx and nasopharynx clear.  NECK:  Supple, no jugular venous  distention. No thyroid enlargement, no tenderness.  LUNGS: Normal breath sounds bilaterally, no wheezing, rales,rhonchi or crepitation. No use of accessory muscles of respiration.  CARDIOVASCULAR: S1, S2 normal. No murmurs, rubs, or gallops.  ABDOMEN: Soft, nontender, nondistended. Bowel sounds present. No organomegaly or mass.  EXTREMITIES: No pedal edema, cyanosis, or clubbing.  NEUROLOGIC: Patient is drowsy PSYCHIATRIC: The patient is drowsy.  SKIN: No obvious rash, lesion, or ulcer.   LABORATORY PANEL:   CBC Recent Labs  Lab 11/06/18 1653  WBC 3.8*  HGB 11.1*  HCT 37.2  PLT 198  MCV 92.5  MCH 27.6  MCHC 29.8*  RDW 16.9*   ------------------------------------------------------------------------------------------------------------------  Chemistries  Recent Labs  Lab 11/06/18 1653 11/06/18 2124  NA 132*  134*  K 4.4 4.0  CL 98 103  CO2 23 23  GLUCOSE 121* 99  BUN 29* 24*  CREATININE 1.41* 1.15*  CALCIUM 8.3* 7.4*   ------------------------------------------------------------------------------------------------------------------ estimated creatinine clearance is 30 mL/min (A) (by C-G formula based on SCr of 1.15 mg/dL (H)). ------------------------------------------------------------------------------------------------------------------ No results for input(s): TSH, T4TOTAL, T3FREE, THYROIDAB in the last 72 hours.  Invalid input(s): FREET3   Coagulation profile No results for input(s): INR, PROTIME in the last 168 hours. ------------------------------------------------------------------------------------------------------------------- No results for input(s): DDIMER in the last 72 hours. -------------------------------------------------------------------------------------------------------------------  Cardiac Enzymes Recent Labs  Lab 11/06/18 1653 11/06/18 2124  TROPONINI 0.05* 0.06*    ------------------------------------------------------------------------------------------------------------------ Invalid input(s): POCBNP  ---------------------------------------------------------------------------------------------------------------  Urinalysis    Component Value Date/Time   COLORURINE YELLOW (A) 11/06/2018 1838   APPEARANCEUR HAZY (A) 11/06/2018 1838   APPEARANCEUR Clear 06/20/2013 0742   LABSPEC 1.011 11/06/2018 1838   LABSPEC 1.016 06/20/2013 0742   PHURINE 7.0 11/06/2018 1838   GLUCOSEU NEGATIVE 11/06/2018 1838   GLUCOSEU Negative 06/20/2013 0742   HGBUR NEGATIVE 11/06/2018 1838   BILIRUBINUR NEGATIVE 11/06/2018 1838   BILIRUBINUR Negative 06/20/2013 0742   KETONESUR 5 (A) 11/06/2018 1838   PROTEINUR NEGATIVE 11/06/2018 1838   NITRITE NEGATIVE 11/06/2018 1838   LEUKOCYTESUR TRACE (A) 11/06/2018 1838   LEUKOCYTESUR Negative 06/20/2013 0742     RADIOLOGY: Dg Chest 2 View  Result Date: 11/06/2018 CLINICAL DATA:  Increasing weakness. Liver lung metastases of unknown primary. EXAM: CHEST - 2 VIEW COMPARISON:  Thoracic spine x-ray November 04, 2018 FINDINGS: A nodule or mass in the right upper lobe is again identified. A torturous thoracic aorta is stable. The heart, hila, and mediastinum are unchanged. No pneumothorax. No focal infiltrate. Minimal atelectasis in the left base. The suspected compression fracture of a lower thoracic vertebral bodies seen on the November 04, 2018 thoracic spine x-ray is unchanged. IMPRESSION: 1. Again noted is a lower thoracic vertebral body compression fracture, thought to be new on the November 04, 2018 comparison. 2. Persistent nodule/mass in the right upper lung. Electronically Signed   By: Dorise Bullion III M.D   On: 11/06/2018 17:24   Ct Head Wo Contrast  Result Date: 11/06/2018 CLINICAL DATA:  Increasing weakness and lethargy today. History of atrial fibrillation and lung cancer. EXAM: CT HEAD WITHOUT CONTRAST  TECHNIQUE: Contiguous axial images were obtained from the base of the skull through the vertex without intravenous contrast. COMPARISON:  CT head 05/12/2017.  PET-CT 10/29/2018. FINDINGS: Brain: There is no evidence of acute intracranial hemorrhage, mass lesion, brain edema or extra-axial fluid collection. There is atrophy with diffuse prominence of the ventricles and subarachnoid spaces. There is chronic low-density in the periventricular white matter and chronic symmetric calcifications of the basal ganglia. There is no CT evidence of acute cortical infarction. Vascular: Extensive intracranial vascular calcifications. No hyperdense vessel identified. Skull: Negative for fracture or focal lesion. Sinuses/Orbits: The visualized paranasal sinuses and mastoid air cells are clear. No orbital abnormalities are seen. Other: None. IMPRESSION: Stable head CT without acute intracranial findings. Stable atrophy and chronic small vessel ischemic changes. Electronically Signed   By: Richardean Sale M.D.   On: 11/06/2018 17:33   Ct Abdomen Pelvis W Contrast  Result Date: 11/07/2018 CLINICAL DATA:  Constipation. Nausea and vomiting. Acute abdominal pain. Patient's family reports weakness and lethargy. EXAM: CT ABDOMEN AND PELVIS WITH CONTRAST TECHNIQUE: Multidetector CT imaging of the abdomen and pelvis was performed using the standard protocol following bolus administration of intravenous contrast. CONTRAST:  58mL OMNIPAQUE IOHEXOL 300 MG/ML  SOLN COMPARISON:  PET CT 10/29/2018, abdominal CT 09/30/2018 FINDINGS: Lower chest: Basilar pulmonary nodules which are recently characterized on PET. Small left pleural effusion which is new. There is adjacent basilar atelectasis. Hepatobiliary: Multiple ill-defined hypodense liver lesions, difficult to define but grossly similar to prior exam. Gallbladder physiologically distended, no calcified stone. No biliary dilatation. Small volume of perihepatic ascites. Pancreas: No ductal  dilatation or inflammation. Spleen: Splenomegaly as seen on prior exams, the spleen spans 16.2 cm cranial caudal. Multiple splenic infarcts which have progressed from prior contrast-enhanced abdominal CT. Infarct in the anterior upper spleen has adjacent stranding. Small amount of perisplenic fluid. Multiple additional low-density splenic lesions. Adrenals/Urinary Tract: No adrenal nodule. No hydronephrosis or perinephric edema. Left renal cysts appear similar to prior exam. Urinary bladder is distended. Pelvic floor laxity with cystocele again seen, less pronounced than on prior exam. Stomach/Bowel: Again seen large proximal duodenal diverticulum and small bowel malrotation with duodenum not crossing the midline. Contrast in fluid-filled small bowel that is not dilated or obstructed. Appendix not visualized, prior appendectomy per history. Colonic diverticulosis with resolution of sigmoid diverticulitis from prior exam. Cecum slightly high-riding in the right mid abdomen. Moderate to large volume of colonic stool. Pelvic floor laxity with rectocele, suture line at the and rectal junction. Vascular/Lymphatic: Aortic atherosclerosis and tortuosity. 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 Splenic vein is tortuous and prominent but no evidence of splenic vein thrombosis. 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 This is unchanged from prior exam. No adenopathy. Reproductive: Status post hysterectomy. No adnexal masses. Other: Small amount of perihepatic and perisplenic free fluid. No free air. No intra-abdominal abscess. No abdominal wall hernia. Musculoskeletal: T10 compression fracture is new from prior CT but unchanged from lumbar spine radiograph 11/04/2018. Degenerative change throughout spine. No focal osseous lesion. IMPRESSION: 1. Moderate colonic stool burden suggesting  constipation. No bowel obstruction. 2. Splenomegaly with multiple splenic infarcts which have progressed from prior contrast-enhanced abdominal CT. 3. Hepatic metastatic disease, better characterized on recent PET. Lung base pulmonary nodules also better assessed on PET. 4. Small amount of perihepatic and perisplenic ascites. Small left pleural effusion is new from prior exam. 5. Distended urinary bladder, cystocele again seen but less prominent than on prior. 6. Colonic diverticulosis with resolution of sigmoid diverticulitis from prior exam. 7. Chronic findings include small splenic artery aneurysm, duodenal diverticulum with partial bowel malrotation, pelvic floor laxity, and Aortic atherosclerosis (ICD10-I70.0). 8. Mild to moderate T10 compression fracture, unchanged from recent radiographs but new from prior CT. Electronically Signed   By: Keith Rake M.D.   On: 11/07/2018 04:38    EKG: Orders placed or performed during the hospital encounter of 11/06/18  . ED EKG  . ED EKG    IMPRESSION AND PLAN: Patient is a 84 year old with cancer with unknown primary with mets  1.  Acute hypoxic respiratory failure etiology unclear patient is on Eliquis chronically.  At this time will obtain a CT scan of the chest to further evaluate We will place her on IV heparin for now  2.  Abdominal pain suspect due to combination of constipation and splenic infarct Due to patient currently being on pain medications I will try dose of Relsotre  3.  Metastatic cancer supportive care prognosis poor  4.  Tachycardia will monitor on telemetry  5.  History  of rheumatoid arthritis continue prednisone twice daily  6.  CODE STATUS discussed with the family DNR   All the records are reviewed and case discussed with ED provider. Management plans discussed with the patient, family and they are in agreement.  CODE STATUS: Code Status History    Date Active Date Inactive Code Status Order ID Comments User  Context   09/30/2018 2213 10/03/2018 1750 Full Code [id number]  Amelia Jo, MD ED   06/08/2018 0431 06/10/2018 1523 Full Code [id number]  Harrie Foreman, MD Inpatient   10/29/2017 1616 10/30/2017 1653 Full Code [id number]  Loletha Grayer, MD ED   12/24/2016 2056 12/26/2016 1817 Full Code [id number]  Lance Coon, MD Inpatient    Advance Directive Documentation     Most Recent Value  Type of Advance Directive  Healthcare Power of Attorney  Pre-existing out of facility DNR order (yellow form or pink MOST form)  -  "MOST" Form in Place?  -       TOTAL TIME TAKING CARE OF THIS PATIENT: 39minutes.    Dustin Flock M.D on 11/07/2018 at 9:28 AM  Between 7am to 6pm - Pager - [phone]  After 6pm go to [email or web address] - password [email or web address] [facility]  [phone]  CC: Primary care physician; Adin Hector, MD

## 2018-11-07 NOTE — Evaluation (Signed)
Physical Therapy Evaluation Patient Details Name: VELINDA WROBEL MRN: [id number] DOB: June 08, 1923 Today's Date: 11/07/2018   History of Present Illness  Patient is a 84 year old female who comes to the ED after a fall and bruising to L zygomatic arch 6 days ago. She has presented with increased confusion and weakness and was found to have a UTI and bowel obstruction.  Pt has been receiving HH PT up until the day she came to the hospital.  PMH includes liver CA with metastatis, mild fracture to T10, PR, DVT and L elbow prosthesis.  Clinical Impression  Patient is a 84 year old female who lives alone and has been receiving [facility] services since a fall in her home 6 days ago.  She ambulates household distances with a RW at baseline.  Pt very lethargic and disoriented upon PT arrival.  She was agreeable to attempt supine to sit to bedside and attempted initiation of LE movement.  Pt was unsuccessful in getting to EOB due to lethargy and apparent abdominal pain.  +2 assist was needed in the attempt.  Pt able to demonstrate ankle PF strength in supine and reported no sensation loss in feet.  She will continue to benefit from skilled PT with focus on strength, tolerance to activity, safe functional mobility and prevention of future falls.  Due to pt not functioning at baseline and concern for caregiver support, pt will benefit from SNF placement following discharge.    Follow Up Recommendations SNF    Equipment Recommendations  None recommended by PT    Recommendations for Other Services       Precautions / Restrictions Precautions Precautions: Fall Restrictions Weight Bearing Restrictions: No      Mobility  Bed Mobility Overal bed mobility: Needs Assistance Bed Mobility: Supine to Sit     Supine to sit: Max assist;+2 for physical assistance     General bed mobility comments: Only partially performed due to pt lethargy.  Pt would initiate movement and then fall asleep again.  She did attemptb  hold PT's hand to move LE's over EOB and was unable to due to reported pain.    Transfers                    Ambulation/Gait                Stairs            Wheelchair Mobility    Modified Rankin (Stroke Patients Only)       Balance Overall balance assessment: History of Falls                                           Pertinent Vitals/Pain Pain Assessment: Faces Faces Pain Scale: Hurts little more Pain Location: Not able to give a number but did state that pain is "not too bad" and placed hand on abdomen. Pain Intervention(s): Limited activity within patient's tolerance    Home Living Family/patient expects to be discharged to:: Private residence Living Arrangements: Alone Available Help at Discharge: Family;Available 24 hours/day(Daughter have been staying with pt around clock with some HH assistance.) Type of Home: House         Home Equipment: Gilford Rile - 2 wheels;Walker - 4 wheels      Prior Function Level of Independence: Independent with assistive device(s)         Comments:  Up until present illness, pt has been managing mobility around the home with RW.     Hand Dominance        Extremity/Trunk Assessment   Upper Extremity Assessment Upper Extremity Assessment: Generalized weakness    Lower Extremity Assessment Lower Extremity Assessment: Generalized weakness    Cervical / Trunk Assessment Cervical / Trunk Assessment: (Not assessed.)  Communication   Communication: HOH  Cognition Arousal/Alertness: Lethargic Behavior During Therapy: WFL for tasks assessed/performed Overall Cognitive Status: Impaired/Different from baseline Area of Impairment: Orientation                 Orientation Level: Place;Situation;Time             General Comments: Patient very lethargic which pt's daughters feel is due to UTI, lack of sleep and pain medication.  This is not pt's baseline.      General Comments       Exercises     Assessment/Plan    PT Assessment Patient needs continued PT services  PT Problem List Decreased strength;Decreased activity tolerance;Decreased balance;Decreased mobility;Decreased cognition       PT Treatment Interventions DME instruction;Therapeutic exercise;Gait training;Balance training;Stair training;Functional mobility training;Therapeutic activities;Patient/family education;Cognitive remediation;Neuromuscular re-education;Wheelchair mobility training    PT Goals (Current goals can be found in the Care Plan section)  Acute Rehab PT Goals PT Goal Formulation: Patient unable to participate in goal setting    Frequency Min 2X/week   Barriers to discharge        Co-evaluation               AM-PAC PT "6 Clicks" Mobility  Outcome Measure Help needed turning from your back to your side while in a flat bed without using bedrails?: A Lot Help needed moving from lying on your back to sitting on the side of a flat bed without using bedrails?: A Lot Help needed moving to and from a bed to a chair (including a wheelchair)?: A Lot Help needed standing up from a chair using your arms (e.g., wheelchair or bedside chair)?: A Lot Help needed to walk in hospital room?: A Lot Help needed climbing 3-5 steps with a railing? : A Lot 6 Click Score: 12    End of Session   Activity Tolerance: Patient limited by lethargy;Patient limited by pain Patient left: in bed;with family/visitor present Nurse Communication: Mobility status(Pt became tachycardic with attempted supine to sit.) PT Visit Diagnosis: History of falling (Z91.81);Muscle weakness (generalized) (M62.81)    Time: 4098-1191 PT Time Calculation (min) (ACUTE ONLY): 12 min   Charges:   PT Evaluation $PT Eval Low Complexity: 1 Low         Roxanne Gates, PT, DPT   Roxanne Gates 11/07/2018, 8:33 AM

## 2018-11-07 NOTE — ED Notes (Signed)
ED Provider at bedside. 

## 2018-11-07 NOTE — Clinical Social Work Note (Signed)
Clinical Social Work Assessment  Patient Details  Name: Carol Bryan MRN: [id number] Date of Birth: 1922-12-31  Date of referral:  11/07/18               Reason for consult:  Facility Placement                Permission sought to share information with:  Facility Sport and exercise psychologist, Family Supports Permission granted to share information::  Yes, Verbal Permission Granted  Name::     [name] daughter [phone] [place] [phone]  Agency::  All facilities  Relationship::     Contact Information:     Housing/Transportation Living arrangements for the past 2 months:  [place] of Information:  Adult Children Patient Interpreter Needed:  None Criminal Activity/Legal Involvement Pertinent to Current Situation/Hospitalization:  No - Comment as needed Significant Relationships:  Adult Children, Other Family Members Lives with:  Adult Children Do you feel safe going back to the place where you live?  Yes Need for family participation in patient care:  Yes (Comment)  Care giving concerns:   All patients daughter are very concerned with her rapid deterioration of ADL's/health  Social Worker assessment / plan: LCSW introduced myself to patient and family members. Patient was resting and lethargic, verbal consent was given by her daughters to access SNF as they state they cant provide intense in home care. They report patient was amblatory 1 day ago and independent with all her ADL's but she is not  Able to walk and very weak and using 2 liters of 02. She struggles with swallowing and will require sofe diet. Was using a walker. Recently attached to [facility] and palliative NP. Family requesting either comfort care or SNF private pay. 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  Emotional Assessment Appearance:  Appears stated age Attitude/Demeanor/Rapport:  Unable to Assess Affect (typically observed):  Unable to Assess Orientation:  Oriented to Self, Oriented to Situation, Oriented to Place Alcohol / Substance use:  Not Applicable Psych involvement (Current and /or in the community):     Discharge Needs  Concerns to be addressed:  Discharge Planning Concerns Readmission within the last 30 days:  No Current discharge risk:  Chronically ill Barriers to Discharge:  Continued Medical Work up   [place], LCSW 11/07/2018, 10:06 AM

## 2018-11-07 NOTE — Progress Notes (Signed)
[place] for heparin Indication: pulmonary embolus  Allergies  Allergen Reactions  . Ciprofloxacin Other (See Comments)    Foot burning; tolerates levaquin  . Methotrexate Other (See Comments)    leukopenia  . Gold-Containing Drug Products Rash  . Other Other (See Comments) and Rash  . Sulfa Antibiotics Rash    Patient Measurements: Height: 5\' 5"  (165.1 cm) Weight: 154 lb (69.9 kg) IBW/kg (Calculated) : 57 Heparin Dosing Weight: 69.9 kg  Vital Signs: Temp: 97.6 F (36.4 C) (12/14 1218) Temp Source: Oral (12/14 1218) BP: 130/71 (12/14 1218) Pulse Rate: 113 (12/14 1218)  Labs: Recent Labs    11/06/18 1653 11/06/18 2124 11/07/18 1244  HGB 11.1*  --  10.5*  HCT 37.2  --  35.1*  PLT 198  --  179  APTT  --   --  44*  LABPROT  --   --  17.3*  INR  --   --  1.43  HEPARINUNFRC  --   --  2.42*  CREATININE 1.41* 1.15*  --   TROPONINI 0.05* 0.06*  --     Estimated Creatinine Clearance: 30 mL/min (A) (by C-G formula based on SCr of 1.15 mg/dL (H)).   Medical History: Past Medical History:  Diagnosis Date  . Atrial flutter ([place])    cardioverted  . Cancer [facility])    recent dx'd with Lung and Liver cancer per daughter.  . Collagen vascular disease ([place])    RA  . DVT (deep venous thrombosis) ([place])   . HLD (hyperlipidemia)   . Hypertension   . Hypothyroid   . Pulmonary embolism ([place])   . RA (rheumatoid arthritis) ([place])   . Sjogren's disease [facility])    Assessment: 84 year old female admitted with weakness and lethargy. Also had hypoxia, tachypnea and tachycardia with concern for PE. Patient takes Eliquis 2.5 mg BID PTA with last dose 12/14 at 0800. Pharmacy consulted for heparin drip for suspected PE.  Per Dr. Posey Pronto, will start heparin drip this evening at time of next dose of Eliquis. Patient is unable to take PO medications at this time so we can not restart Eliquis.  Goal of Therapy:  Heparin level 0.3-0.7 units/ml aPTT 66-102  seconds Monitor platelets by anticoagulation protocol: Yes   Plan:  Will start heparin drip at 1000 units/hr to start today at 1900. APTT ordered 12/15 at 0300. CBC and HL with morning labs.  Pharmacy to continue to follow.  Tawnya Crook, PharmD Pharmacy Resident  11/07/2018 5:40 PM

## 2018-11-07 NOTE — ED Provider Notes (Signed)
-----------------------------------------   6:50 AM on 11/07/2018 -----------------------------------------  The patient has been stable overnight but has been complaining of abdominal pain and distention.  She has been mildly tachycardic since coming into the emergency department about 14 hours ago and she has received 2 L of IV fluids given her significant dehydration.  She remains mildly tachycardic.  Reportedly she has not had a bowel movement in almost a week.  She was given lactulose 15 g earlier and has had multiple other laxatives including Senokot and Colace and a rectal suppository but nothing has helped.    Given her age and the complaint of abdominal pain and distention I obtained a CT scan with oral and IV contrast.  The CT scan shows chronic changes and constipation but no evidence of SBO nor ileus.  I updated the patient and her daughter.  She received morphine 2 mg IV for the discomfort which seems to have helped but she is still occasionally moaning.  After discussing it with the daughter, we proceeded with a soapsuds enema with magnesium citrate and liquid Colace added to it.  There was some success but it was relatively minimal.  The patient does seem more comfortable now.  Daughter remains at bedside.   Hinda Kehr, MD 11/07/18 [phone]

## 2018-11-08 LAB — CBC
HCT: 30.2 % — ABNORMAL LOW (ref 36.0–46.0)
HEMOGLOBIN: 8.7 g/dL — AB (ref 12.0–15.0)
MCH: 27.8 pg (ref 26.0–34.0)
MCHC: 28.8 g/dL — ABNORMAL LOW (ref 30.0–36.0)
MCV: 96.5 fL (ref 80.0–100.0)
Platelets: 159 10*3/uL (ref 150–400)
RBC: 3.13 MIL/uL — ABNORMAL LOW (ref 3.87–5.11)
RDW: 17.2 % — ABNORMAL HIGH (ref 11.5–15.5)
WBC: 3.4 10*3/uL — ABNORMAL LOW (ref 4.0–10.5)
nRBC: 0 % (ref 0.0–0.2)

## 2018-11-08 LAB — BASIC METABOLIC PANEL
Anion gap: 11 (ref 5–15)
BUN: 23 mg/dL (ref 8–23)
CHLORIDE: 107 mmol/L (ref 98–111)
CO2: 17 mmol/L — ABNORMAL LOW (ref 22–32)
Calcium: 7.1 mg/dL — ABNORMAL LOW (ref 8.9–10.3)
Creatinine, Ser: 0.94 mg/dL (ref 0.44–1.00)
GFR calc Af Amer: >60 mL/min (ref >60)
GFR calc non Af Amer: 52 mL/min — ABNORMAL LOW (ref >60)
Glucose, Bld: 73 mg/dL (ref 70–99)
POTASSIUM: 4.3 mmol/L (ref 3.5–5.1)
Sodium: 135 mmol/L (ref 135–145)

## 2018-11-08 LAB — APTT
APTT: 124 s — AB (ref 24–36)
aPTT: 111 seconds — ABNORMAL HIGH (ref 24–36)

## 2018-11-08 LAB — HEPARIN LEVEL (UNFRACTIONATED)
Heparin Unfractionated: 2.54 IU/mL — ABNORMAL HIGH (ref 0.30–0.70)
Heparin Unfractionated: 2.38 IU/mL — ABNORMAL HIGH (ref 0.30–0.70)

## 2018-11-08 MED ORDER — FLEET ENEMA 7-19 GM/118ML RE ENEM
1.0000 | ENEMA | Freq: Once | RECTAL | Status: AC
  Administered 2018-11-08: 12:00:00 1 via RECTAL

## 2018-11-08 MED ORDER — FUROSEMIDE 10 MG/ML IJ SOLN
20.0000 mg | Freq: Two times a day (BID) | INTRAMUSCULAR | Status: DC
  Administered 2018-11-08 (×2): 20 mg via INTRAVENOUS
  Filled 2018-11-08 (×2): qty 2

## 2018-11-08 NOTE — Progress Notes (Signed)
[facility]                                                                                                                                                                                  Patient Demographics   Carol Bryan, is a 84 y.o. female, DOB - [birth year], ZSW:[id number]  Admit date - 11/06/2018   Admitting Physician Dustin Flock, MD  Outpatient Primary MD for the patient is Carol High III, MD   LOS - 1  Subjective: Patient's oxygen requirements increased to 4 L today some swelling noted in her hands Patient had received morphine she was having abdominal pain earlier   Review of Systems:   CONSTITUTIONAL: No documented fever. No fatigue, weakness. No weight gain, no weight loss.  EYES: No blurry or double vision.  ENT: No tinnitus. No postnasal drip. No redness of the oropharynx.  RESPIRATORY: No cough, no wheeze, no hemoptysis. No dyspnea.  CARDIOVASCULAR: No chest pain. No orthopnea. No palpitations. No syncope.  GASTROINTESTINAL: No nausea, no vomiting or diarrhea.  Positive abdominal pain. No melena or hematochezia.  GENITOURINARY: No dysuria or hematuria.  ENDOCRINE: No polyuria or nocturia. No heat or cold intolerance.  HEMATOLOGY: No anemia. No bruising. No bleeding.  INTEGUMENTARY: No rashes. No lesions.  MUSCULOSKELETAL: No arthritis. No swelling. No gout.  NEUROLOGIC: No numbness, tingling, or ataxia. No seizure-type activity.  PSYCHIATRIC: No anxiety. No insomnia. No ADD.    Vitals:   Vitals:   11/07/18 1218 11/07/18 2051 11/08/18 0358 11/08/18 0904  BP: 130/71 111/74 111/64 122/68  Pulse: (!) 113 87 85 81  Resp:  17 17   Temp: 97.6 F (36.4 C) 98.6 F (37 C) (!) 97.3 F (36.3 C)   TempSrc: Oral Oral    SpO2: (!) 86% 98% 100% 90%  Weight:      Height:        Wt Readings from Last 3 Encounters:  11/06/18 69.9 kg  10/20/18 71.4 kg  10/03/18 70 kg     Intake/Output Summary (Last 24 hours) at  11/08/2018 1319 Last data filed at 11/08/2018 0725 Gross per 24 hour  Intake 240 ml  Output 2250 ml  Net -2010 ml    Physical Exam:   GENERAL: Pleasant-appearing in no apparent distress.  HEAD, EYES, EARS, NOSE AND THROAT: Atraumatic, normocephalic. Extraocular muscles are intact. Pupils equal and reactive to light. Sclerae anicteric. No conjunctival injection. No oro-pharyngeal erythema.  NECK: Supple. There is no jugular venous distention. No bruits, no lymphadenopathy, no thyromegaly.  HEART: Regular rate and rhythm,. No murmurs, no rubs, no clicks.  LUNGS: Clear to auscultation bilaterally. No rales or rhonchi. No  wheezes.  ABDOMEN: Soft, flat, nontender, nondistended. Has good bowel sounds. No hepatosplenomegaly appreciated.  EXTREMITIES: No evidence of any cyanosis, clubbing, or 1+ peripheral edema.  +2 pedal and radial pulses bilaterally.  NEUROLOGIC: The patient is alert, awake, and oriented x3 with no focal motor or sensory deficits appreciated bilaterally.  SKIN: Moist and warm with no rashes appreciated.  Psych: Not anxious, depressed LN: No inguinal LN enlargement    Antibiotics   Anti-infectives (From admission, onward)   Start     Dose/Rate Route Frequency Ordered Stop   11/07/18 1000  levofloxacin (LEVAQUIN) tablet 500 mg  Status:  Discontinued     500 mg Oral Daily 11/06/18 2211 11/07/18 1432   11/07/18 1000  hydroxychloroquine (PLAQUENIL) tablet 200 mg     200 mg Oral Daily 11/06/18 2212     11/06/18 1945  cefTRIAXone (ROCEPHIN) 1 g in sodium chloride 0.9 % 100 mL IVPB     1 g 200 mL/hr over 30 Minutes Intravenous  Once 11/06/18 1937 11/06/18 2326      Medications   Scheduled Meds: . amitriptyline  20 mg Oral QHS  . cholecalciferol  1,000 Units Oral Daily  . folic acid  3 mg Oral Daily  . furosemide  20 mg Intravenous Q12H  . hydroxychloroquine  200 mg Oral Daily  . levothyroxine  50 mcg Oral QAC breakfast  . metoprolol succinate  12.5 mg Oral Daily  .  pantoprazole  40 mg Oral Daily  . potassium chloride SA  20 mEq Oral Daily  . predniSONE  5 mg Oral BID WC   Continuous Infusions: . heparin 900 Units/hr (11/08/18 0559)   PRN Meds:.acetaminophen **OR** acetaminophen, azelastine, HYDROcodone-acetaminophen, HYDROmorphone (DILAUDID) injection, ondansetron **OR** ondansetron (ZOFRAN) IV, polyvinyl alcohol, prochlorperazine   Data Review:   Micro Results Recent Results (from the past 240 hour(s))  Urine culture     Status: Abnormal (Preliminary result)   Collection Time: 11/06/18  6:38 PM  Result Value Ref Range Status   Specimen Description   Final    URINE, RANDOM Performed at [facility], [address], [place], [place] [address]    Special Requests   Final    NONE Performed at [facility], [address], [place], [place] [address]    Culture >=100,000 COLONIES/mL ENTEROBACTER AEROGENES (A)  Final   Report Status PENDING  Incomplete    Radiology Reports Dg Chest 2 View  Result Date: 11/06/2018 CLINICAL DATA:  Increasing weakness. Liver lung metastases of unknown primary. EXAM: CHEST - 2 VIEW COMPARISON:  Thoracic spine x-ray November 04, 2018 FINDINGS: A nodule or mass in the right upper lobe is again identified. A torturous thoracic aorta is stable. The heart, hila, and mediastinum are unchanged. No pneumothorax. No focal infiltrate. Minimal atelectasis in the left base. The suspected compression fracture of a lower thoracic vertebral bodies seen on the November 04, 2018 thoracic spine x-ray is unchanged. IMPRESSION: 1. Again noted is a lower thoracic vertebral body compression fracture, thought to be new on the November 04, 2018 comparison. 2. Persistent nodule/mass in the right upper lung. Electronically Signed   By: Dorise Bullion Bryan M.D   On: 11/06/2018 17:24   Dg Thoracic Spine 2 View  Result Date: 11/04/2018 CLINICAL DATA:  Fall with left-sided back pain EXAM: THORACIC SPINE 2 VIEWS; LUMBAR SPINE  - COMPLETE 4+ VIEW COMPARISON:  Chest radiograph 09/30/2018 FINDINGS: THORACIC SPINE: There is moderate height loss of T10 that appears new compared to the chest  radiograph 09/30/2018. No other focal abnormality is visualized within the thoracic spine. Alignment of the thoracic spine is normal. LUMBAR SPINE: There is unchanged grade 1 anterolisthesis at L3-L4. No lumbar spine fracture. IMPRESSION: Mild-to-moderate height loss of T10, likely new compared to chest radiograph 09/30/2018 and concerning for fracture. MRI may be helpful for further assessment. These results will be called to the ordering clinician or representative by the Radiologist Assistant, and communication documented in the PACS or zVision Dashboard. Electronically Signed   By: Ulyses Jarred M.D.   On: 11/04/2018 16:50   Dg Lumbar Spine Complete  Result Date: 11/04/2018 CLINICAL DATA:  Fall with left-sided back pain EXAM: THORACIC SPINE 2 VIEWS; LUMBAR SPINE - COMPLETE 4+ VIEW COMPARISON:  Chest radiograph 09/30/2018 FINDINGS: THORACIC SPINE: There is moderate height loss of T10 that appears new compared to the chest radiograph 09/30/2018. No other focal abnormality is visualized within the thoracic spine. Alignment of the thoracic spine is normal. LUMBAR SPINE: There is unchanged grade 1 anterolisthesis at L3-L4. No lumbar spine fracture. IMPRESSION: Mild-to-moderate height loss of T10, likely new compared to chest radiograph 09/30/2018 and concerning for fracture. MRI may be helpful for further assessment. These results will be called to the ordering clinician or representative by the Radiologist Assistant, and communication documented in the PACS or zVision Dashboard. Electronically Signed   By: Ulyses Jarred M.D.   On: 11/04/2018 16:50   Ct Head Wo Contrast  Result Date: 11/06/2018 CLINICAL DATA:  Increasing weakness and lethargy today. History of atrial fibrillation and lung cancer. EXAM: CT HEAD WITHOUT CONTRAST TECHNIQUE: Contiguous  axial images were obtained from the base of the skull through the vertex without intravenous contrast. COMPARISON:  CT head 05/12/2017.  PET-CT 10/29/2018. FINDINGS: Brain: There is no evidence of acute intracranial hemorrhage, mass lesion, brain edema or extra-axial fluid collection. There is atrophy with diffuse prominence of the ventricles and subarachnoid spaces. There is chronic low-density in the periventricular white matter and chronic symmetric calcifications of the basal ganglia. There is no CT evidence of acute cortical infarction. Vascular: Extensive intracranial vascular calcifications. No hyperdense vessel identified. Skull: Negative for fracture or focal lesion. Sinuses/Orbits: The visualized paranasal sinuses and mastoid air cells are clear. No orbital abnormalities are seen. Other: None. IMPRESSION: Stable head CT without acute intracranial findings. Stable atrophy and chronic small vessel ischemic changes. Electronically Signed   By: Richardean Sale M.D.   On: 11/06/2018 17:33   Ct Chest Wo Contrast  Result Date: 11/07/2018 CLINICAL DATA:  Increased shortness of breath and weakness starting yesterday. Recent diagnosis of metastatic cancer of uncertain primary. EXAM: CT CHEST WITHOUT CONTRAST TECHNIQUE: Multidetector CT imaging of the chest was performed following the standard protocol without IV contrast. COMPARISON:  PET-CT October 29, 2018 FINDINGS: Cardiovascular: Atherosclerotic changes seen in the nonaneurysmal aorta. The central pulmonary arteries are normal in caliber. Coronary artery calcifications are identified. The heart is unchanged. Mediastinum/Nodes: The thyroid and esophagus are unremarkable. There is a small left pleural effusion. No other effusions. No adenopathy. Lungs/Pleura: Central airways are normal. No pneumothorax. The nodule in the right upper lobe on series 3, image 42 measures 3.1 cm today versus 2.4 cm on the comparison PET-CT. The nodule in the superior segment of  the left lower lobe measures 2.1 cm today and on the previous study. This nodule is seen on series 3, image 61 today. The nodule described in the left lower lobe on the previous study is difficult to measure today due to adjacent  infiltrate. The nodule described in the deep right posterior lung base on the previous study is also difficult to measure due to adjacent atelectasis but appears larger in the mid interval measuring 2 cm today versus 1.7 cm previously. Numerous other pulmonary nodules are stable to larger in the interval. There is atelectasis in the right middle lobe. There is atelectasis posteriorly in the right base. Opacity in the left base with an associated small effusion could represent atelectasis or infiltrate/pneumonia. Upper Abdomen: Hepatic steatosis. Known liver lesions are not well assessed on this unenhanced study. No splenic lesions are also not as well seen without contrast. No other changes in the upper abdomen. Musculoskeletal: 20% loss of anterior height of T10 is stable since November 04, 2018 x-rays. IMPRESSION: 1. Bilateral pulmonary nodules, thought to represent metastatic disease on the recent PET-CT, have overall worsened in the interval. 2. There is a small left effusion with underlying opacity. The underlying opacity could represent atelectasis or pneumonia. Recommend clinical correlation and follow-up to resolution. 3. Atherosclerotic change in the nonaneurysmal aorta. Coronary artery calcifications. 4. Known hepatic and splenic lesions are not well seen on today's study due to lack of contrast. 5. Stable mild compression fracture of T10. Aortic Atherosclerosis (ICD10-I70.0). Electronically Signed   By: Dorise Bullion Bryan M.D   On: 11/07/2018 10:43   Ct Abdomen Pelvis W Contrast  Result Date: 11/07/2018 CLINICAL DATA:  Constipation. Nausea and vomiting. Acute abdominal pain. Patient's family reports weakness and lethargy. EXAM: CT ABDOMEN AND PELVIS WITH CONTRAST TECHNIQUE:  Multidetector CT imaging of the abdomen and pelvis was performed using the standard protocol following bolus administration of intravenous contrast. CONTRAST:  77mL OMNIPAQUE IOHEXOL 300 MG/ML  SOLN COMPARISON:  PET CT 10/29/2018, abdominal CT 09/30/2018 FINDINGS: Lower chest: Basilar pulmonary nodules which are recently characterized on PET. Small left pleural effusion which is new. There is adjacent basilar atelectasis. Hepatobiliary: Multiple ill-defined hypodense liver lesions, difficult to define but grossly similar to prior exam. Gallbladder physiologically distended, no calcified stone. No biliary dilatation. Small volume of perihepatic ascites. Pancreas: No ductal dilatation or inflammation. Spleen: Splenomegaly as seen on prior exams, the spleen spans 16.2 cm cranial caudal. Multiple splenic infarcts which have progressed from prior contrast-enhanced abdominal CT. Infarct in the anterior upper spleen has adjacent stranding. Small amount of perisplenic fluid. Multiple additional low-density splenic lesions. Adrenals/Urinary Tract: No adrenal nodule. No hydronephrosis or perinephric edema. Left renal cysts appear similar to prior exam. Urinary bladder is distended. Pelvic floor laxity with cystocele again seen, less pronounced than on prior exam. Stomach/Bowel: Again seen large proximal duodenal diverticulum and small bowel malrotation with duodenum not crossing the midline. Contrast in fluid-filled small bowel that is not dilated or obstructed. Appendix not visualized, prior appendectomy per history. Colonic diverticulosis with resolution of sigmoid diverticulitis from prior exam. Cecum slightly high-riding in the right mid abdomen. Moderate to large volume of colonic stool. Pelvic floor laxity with rectocele, suture line at the and rectal junction. Vascular/Lymphatic: Aortic atherosclerosis and tortuosity. 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 Splenic vein is tortuous and prominent  but no evidence of splenic vein thrombosis. 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 This is unchanged from prior exam. No adenopathy. Reproductive: Status post hysterectomy. No adnexal masses. Other: Small amount of perihepatic and perisplenic free fluid. No free air. No intra-abdominal abscess. No abdominal wall hernia. Musculoskeletal:  T10 compression fracture is new from prior CT but unchanged from lumbar spine radiograph 11/04/2018. Degenerative change throughout spine. No focal osseous lesion. IMPRESSION: 1. Moderate colonic stool burden suggesting constipation. No bowel obstruction. 2. Splenomegaly with multiple splenic infarcts which have progressed from prior contrast-enhanced abdominal CT. 3. Hepatic metastatic disease, better characterized on recent PET. Lung base pulmonary nodules also better assessed on PET. 4. Small amount of perihepatic and perisplenic ascites. Small left pleural effusion is new from prior exam. 5. Distended urinary bladder, cystocele again seen but less prominent than on prior. 6. Colonic diverticulosis with resolution of sigmoid diverticulitis from prior exam. 7. Chronic findings include small splenic artery aneurysm, duodenal diverticulum with partial bowel malrotation, pelvic floor laxity, and Aortic atherosclerosis (ICD10-I70.0). 8. Mild to moderate T10 compression fracture, unchanged from recent radiographs but new from prior CT. Electronically Signed   By: Keith Rake M.D.   On: 11/07/2018 04:38   Nm Pet Image Initial (pi) Skull Base To Thigh  Result Date: 10/29/2018 CLINICAL DATA:  Initial treatment strategy for pulmonary nodules. EXAM: NUCLEAR MEDICINE PET SKULL BASE TO THIGH TECHNIQUE: 8.79 mCi F-18 FDG was injected intravenously. Full-ring PET imaging was performed from the skull base to thigh after the radiotracer. CT data was obtained and used for  attenuation correction and anatomic localization. Fasting blood glucose: 92 mg/dl COMPARISON:  CT scan 09/30/2018 FINDINGS: Mediastinal blood pool activity: SUV max 2.67 NECK: No hypermetabolic lymph nodes in the neck. Incidental CT findings: none CHEST: 23.5 mm right upper lobe pulmonary nodule on image number 70 is hypermetabolic with SUV max of 8.34. 20 mm superior segment left lower lobe pulmonary nodule on image number 80 is hypermetabolic with SUV max of 1.96. 17 mm left lower lobe nodule on image number 222 is hypermetabolic with SUV max of 9.79. 17 mm right basilar nodule on image number 892 is hypermetabolic with SUV max of 1.19. Other small scattered pulmonary nodules are again noted. No enlarged or hypermetabolic mediastinal or hilar lymph nodes. No breast masses, supraclavicular or axillary adenopathy. Incidental CT findings: Stable emphysematous changes and pulmonary scarring. Stable advanced vascular calcifications with three-vessel coronary artery calcifications. ABDOMEN/PELVIS: The segment 5 liver lesion is hypermetabolic and appears larger but difficult to measure without contrast SUV max is 9.06. Smaller lesion in segment 6 is hypermetabolic with SUV max of 4.17. The spleen is diffusely hypermetabolic with multiple splenic lesions as demonstrated on the previous CT scan. No adrenal gland lesions.  No abdominal/pelvic lymphadenopathy. Incidental CT findings: Severe sigmoid colon diverticulosis without findings for acute diverticulitis. SKELETON: No findings for osseous metastatic disease. Incidental CT findings: none IMPRESSION: 1. Multiple hypermetabolic pulmonary nodules most consistent with metastatic disease. No enlarged or hypermetabolic mediastinal or hilar lymph nodes. 2. Hypermetabolic hepatic metastasis. 3. Enlarged and diffusely hypermetabolic spleen. 4. No abdominal/pelvic lymphadenopathy. 5. No obvious primary tumor is identified. A liver biopsy may be the safest option. Electronically  Signed   By: Marijo Sanes M.D.   On: 10/29/2018 20:41     CBC Recent Labs  Lab 11/06/18 1653 11/07/18 1244 11/08/18 0431  WBC 3.8* 3.7* 3.4*  HGB 11.1* 10.5* 8.7*  HCT 37.2 35.1* 30.2*  PLT 198 179 159  MCV 92.5 92.6 96.5  MCH 27.6 27.7 27.8  MCHC 29.8* 29.9* 28.8*  RDW 16.9* 17.2* 17.2*    Chemistries  Recent Labs  Lab 11/06/18 1653 11/06/18 2124 11/06/18 2130  NA 132* 134*  --   K 4.4 4.0  --   CL 98  103  --   CO2 23 23  --   GLUCOSE 121* 99  --   BUN 29* 24*  --   CREATININE 1.41* 1.15*  --   CALCIUM 8.3* 7.4*  --   AST  --   --  25  ALT  --   --  14  ALKPHOS  --   --  149*  BILITOT  --   --  0.7   ------------------------------------------------------------------------------------------------------------------ estimated creatinine clearance is 30 mL/min (A) (by C-G formula based on SCr of 1.15 mg/dL (H)). ------------------------------------------------------------------------------------------------------------------ No results for input(s): HGBA1C in the last 72 hours. ------------------------------------------------------------------------------------------------------------------ No results for input(s): CHOL, HDL, LDLCALC, TRIG, CHOLHDL, LDLDIRECT in the last 72 hours. ------------------------------------------------------------------------------------------------------------------ No results for input(s): TSH, T4TOTAL, T3FREE, THYROIDAB in the last 72 hours.  Invalid input(s): FREET3 ------------------------------------------------------------------------------------------------------------------ No results for input(s): VITAMINB12, FOLATE, FERRITIN, TIBC, IRON, RETICCTPCT in the last 72 hours.  Coagulation profile Recent Labs  Lab 11/07/18 1244  INR 1.43    No results for input(s): DDIMER in the last 72 hours.  Cardiac Enzymes Recent Labs  Lab 11/06/18 1653 11/06/18 2124  TROPONINI 0.05* 0.06*    ------------------------------------------------------------------------------------------------------------------ Invalid input(s): POCBNP    Assessment & Plan   IMPRESSION AND PLAN: Patient is a 84 year old with cancer with unknown primary with mets  1.  Acute hypoxic respiratory failure  This is related to progression of her metastatic cancer to her lungs based on CT scan evaluation yesterday Continue oxygen therapy Due to fluid overload related to IV fluids I will give her  we will give her Lasix 2.  Abdominal pain suspect due to combination of constipation and splenic infarct We will give her Fleet enema today   3.  Metastatic cancer supportive care prognosis poor  4.  Tachycardia will monitor on telemetry  5.  History of rheumatoid arthritis continue prednisone twice daily  6.  CODE STATUS DNR     Code Status Orders  (From admission, onward)         Start     Ordered   11/07/18 0945  Do not attempt resuscitation (DNR)  Continuous    Question Answer Comment  In the event of cardiac or respiratory ARREST Do not call a "code blue"   In the event of cardiac or respiratory ARREST Do not perform Intubation, CPR, defibrillation or ACLS   In the event of cardiac or respiratory ARREST Use medication by any route, position, wound care, and other measures to relive pain and suffering. May use oxygen, suction and manual treatment of airway obstruction as needed for comfort.      11/07/18 0946        Code Status History    Date Active Date Inactive Code Status Order ID Comments User Context   09/30/2018 2213 10/03/2018 1750 Full Code [id number]  Amelia Jo, MD ED   06/08/2018 0431 06/10/2018 1523 Full Code [id number]  Harrie Foreman, MD Inpatient   10/29/2017 1616 10/30/2017 1653 Full Code [id number]  Loletha Grayer, MD ED   12/24/2016 2056 12/26/2016 1817 Full Code [id number]  Lance Coon, MD Inpatient    Advance Directive Documentation     Most Recent Value   Type of Advance Directive  Healthcare Power of Attorney, Living will  Pre-existing out of facility DNR order (yellow form or pink MOST form)  -  "MOST" Form in Place?  -           Consults none  DVT Prophylaxis Heparin Lab Results  Component Value  Date   PLT 159 11/08/2018     Time Spent in minutes 35 minutes greater than 50% of time spent in care coordination and counseling patient regarding the condition and plan of care.   Dustin Flock M.D on 11/08/2018 at 1:19 PM  Between 7am to 6pm - Pager - [phone]  After 6pm go to [email or web address] - Proofreader  [employer]   Office  [phone]

## 2018-11-08 NOTE — Progress Notes (Signed)
[name] for heparin Indication: pulmonary embolus  Allergies  Allergen Reactions  . Ciprofloxacin Other (See Comments)    Foot burning; tolerates levaquin  . Methotrexate Other (See Comments)    leukopenia  . Gold-Containing Drug Products Rash  . Other Other (See Comments) and Rash  . Sulfa Antibiotics Rash    Patient Measurements: Height: 5\' 5"  (165.1 cm) Weight: 154 lb (69.9 kg) IBW/kg (Calculated) : 57 Heparin Dosing Weight: 69.9 kg  Vital Signs: Temp: 97.3 F (36.3 C) (12/15 0358) Temp Source: Oral (12/14 2051) BP: 111/64 (12/15 0358) Pulse Rate: 85 (12/15 0358)  Labs: Recent Labs    11/06/18 1653 11/06/18 2124 11/07/18 1244 11/08/18 0431  HGB 11.1*  --  10.5* 8.7*  HCT 37.2  --  35.1* 30.2*  PLT 198  --  179 159  APTT  --   --  44* 124*  LABPROT  --   --  17.3*  --   INR  --   --  1.43  --   HEPARINUNFRC  --   --  2.42*  --   CREATININE 1.41* 1.15*  --   --   TROPONINI 0.05* 0.06*  --   --     Estimated Creatinine Clearance: 30 mL/min (A) (by C-G formula based on SCr of 1.15 mg/dL (H)).   Medical History: Past Medical History:  Diagnosis Date  . Atrial flutter ([place])    cardioverted  . Cancer [facility])    recent dx'd with Lung and Liver cancer per daughter.  . Collagen vascular disease ([place])    RA  . DVT (deep venous thrombosis) ([place])   . HLD (hyperlipidemia)   . Hypertension   . Hypothyroid   . Pulmonary embolism ([place])   . RA (rheumatoid arthritis) ([place])   . Sjogren's disease [facility])    Assessment: 84 year old female admitted with weakness and lethargy. Also had hypoxia, tachypnea and tachycardia with concern for PE. Patient takes Eliquis 2.5 mg BID PTA with last dose 12/14 at 0800. Pharmacy consulted for heparin drip for suspected PE.  Per Dr. Posey Pronto, will start heparin drip this evening at time of next dose of Eliquis. Patient is unable to take PO medications at this time so we can not restart  Eliquis.  Goal of Therapy:  Heparin level 0.3-0.7 units/ml aPTT 66-102 seconds Monitor platelets by anticoagulation protocol: Yes   Plan:  Will start heparin drip at 1000 units/hr to start today at 1900. APTT ordered 12/15 at 0300. CBC and HL with morning labs.  12/15 0430 aPTT 124. Decrease to 900 units/hr and recheck in 6 hours.  Pharmacy to continue to follow.  [name], PharmD Pharmacy Resident  11/08/2018 5:42 AM

## 2018-11-08 NOTE — Progress Notes (Signed)
[place] for heparin Indication: pulmonary embolus  Allergies  Allergen Reactions  . Ciprofloxacin Other (See Comments)    Foot burning; tolerates levaquin  . Methotrexate Other (See Comments)    leukopenia  . Gold-Containing Drug Products Rash  . Other Other (See Comments) and Rash  . Sulfa Antibiotics Rash    Patient Measurements: Height: 5\' 5"  (165.1 cm) Weight: 154 lb (69.9 kg) IBW/kg (Calculated) : 57 Heparin Dosing Weight: 69.9 kg  Vital Signs: Temp: 97.5 F (36.4 C) (12/15 1535) Temp Source: Oral (12/15 1535) BP: 116/68 (12/15 1535) Pulse Rate: 80 (12/15 1535)  Labs: Recent Labs    11/06/18 1653 11/06/18 2124 11/07/18 1244 11/08/18 0431 11/08/18 1341  HGB 11.1*  --  10.5* 8.7*  --   HCT 37.2  --  35.1* 30.2*  --   PLT 198  --  179 159  --   APTT  --   --  44* 124* 111*  LABPROT  --   --  17.3*  --   --   INR  --   --  1.43  --   --   HEPARINUNFRC  --   --  2.42* 2.54* 2.38*  CREATININE 1.41* 1.15*  --   --  0.94  TROPONINI 0.05* 0.06*  --   --   --     Estimated Creatinine Clearance: 36.7 mL/min (by C-G formula based on SCr of 0.94 mg/dL).   Medical History: Past Medical History:  Diagnosis Date  . Atrial flutter ([place])    cardioverted  . Cancer [facility])    recent dx'd with Lung and Liver cancer per daughter.  . Collagen vascular disease ([place])    RA  . DVT (deep venous thrombosis) ([place])   . HLD (hyperlipidemia)   . Hypertension   . Hypothyroid   . Pulmonary embolism ([place])   . RA (rheumatoid arthritis) ([place])   . Sjogren's disease [facility])    Assessment: 84 year old female admitted with weakness and lethargy. Also had hypoxia, tachypnea and tachycardia with concern for PE. Patient takes Eliquis 2.5 mg BID PTA with last dose 12/14 at 0800. Pharmacy consulted for heparin drip for suspected PE.  Per Dr. Posey Pronto, will start heparin drip this evening at time of next dose of Eliquis. Patient is unable to take PO  medications at this time so we can not restart Eliquis.  12/14 Heparin drip started @ 1000 units/hr 12/15 AM: heparin drip reduced to 900 units/hr.   Goal of Therapy:  Heparin level 0.3-0.7 units/ml aPTT 66-102 seconds Monitor platelets by anticoagulation protocol: Yes   Plan:  12/15 1500 aPTT 111. Level if slightly supratherapeutic. Will decrease heparin infusion to 800 units/hr. Recheck aPTT 8 hours after heparin rate change.  CBC and heparin level ordered with AM labs per protocol.   Pharmacy to continue to follow.  Pernell Dupre, PharmD, BCPS Clinical Pharmacist 11/08/2018 4:00 PM

## 2018-11-09 ENCOUNTER — Other Ambulatory Visit: Payer: Self-pay | Admitting: *Deleted

## 2018-11-09 ENCOUNTER — Encounter: Payer: Self-pay | Admitting: Oncology

## 2018-11-09 DIAGNOSIS — K769 Liver disease, unspecified: Secondary | ICD-10-CM

## 2018-11-09 LAB — CBC
HCT: 30.2 % — ABNORMAL LOW (ref 36.0–46.0)
Hemoglobin: 9.1 g/dL — ABNORMAL LOW (ref 12.0–15.0)
MCH: 28 pg (ref 26.0–34.0)
MCHC: 30.1 g/dL (ref 30.0–36.0)
MCV: 92.9 fL (ref 80.0–100.0)
Platelets: 172 10*3/uL (ref 150–400)
RBC: 3.25 MIL/uL — ABNORMAL LOW (ref 3.87–5.11)
RDW: 17.2 % — ABNORMAL HIGH (ref 11.5–15.5)
WBC: 4.5 10*3/uL (ref 4.0–10.5)
nRBC: 0 % (ref 0.0–0.2)

## 2018-11-09 LAB — APTT
APTT: 90 s — AB (ref 24–36)
aPTT: 148 seconds — ABNORMAL HIGH (ref 24–36)
aPTT: 66 seconds — ABNORMAL HIGH (ref 24–36)

## 2018-11-09 LAB — URINE CULTURE: Culture: >=100000 — AB

## 2018-11-09 LAB — HEPARIN LEVEL (UNFRACTIONATED): Heparin Unfractionated: 1.65 IU/mL — ABNORMAL HIGH (ref 0.30–0.70)

## 2018-11-09 MED ORDER — MAGNESIUM CITRATE PO SOLN
1.0000 | Freq: Once | ORAL | Status: AC
  Administered 2018-11-09: 13:00:00 1 via ORAL
  Filled 2018-11-09: qty 296

## 2018-11-09 MED ORDER — LEVOFLOXACIN 250 MG PO TABS
250.0000 mg | ORAL_TABLET | Freq: Every day | ORAL | Status: DC
  Administered 2018-11-09 – 2018-11-11 (×3): 250 mg via ORAL
  Filled 2018-11-09 (×3): qty 1

## 2018-11-09 MED ORDER — HEPARIN (PORCINE) 25000 UT/250ML-% IV SOLN
600.0000 [IU]/h | INTRAVENOUS | Status: DC
  Administered 2018-11-09: 600 [IU]/h via INTRAVENOUS

## 2018-11-09 MED ORDER — APIXABAN 2.5 MG PO TABS
2.5000 mg | ORAL_TABLET | Freq: Two times a day (BID) | ORAL | Status: DC
  Administered 2018-11-09 – 2018-11-12 (×7): 2.5 mg via ORAL
  Filled 2018-11-09 (×7): qty 1

## 2018-11-09 MED ORDER — BISACODYL 10 MG RE SUPP
10.0000 mg | Freq: Every day | RECTAL | Status: DC | PRN
  Administered 2018-11-10: 10 mg via RECTAL
  Filled 2018-11-09 (×4): qty 1

## 2018-11-09 MED ORDER — FUROSEMIDE 20 MG PO TABS
20.0000 mg | ORAL_TABLET | Freq: Two times a day (BID) | ORAL | Status: DC
  Administered 2018-11-09 – 2018-11-12 (×6): 20 mg via ORAL
  Filled 2018-11-09 (×6): qty 1

## 2018-11-09 NOTE — Progress Notes (Signed)
[place] for heparin Indication: pulmonary embolus  Allergies  Allergen Reactions  . Ciprofloxacin Other (See Comments)    Foot burning; tolerates levaquin  . Methotrexate Other (See Comments)    leukopenia  . Gold-Containing Drug Products Rash  . Other Other (See Comments) and Rash  . Sulfa Antibiotics Rash    Patient Measurements: Height: 5\' 5"  (165.1 cm) Weight: 154 lb (69.9 kg) IBW/kg (Calculated) : 57 Heparin Dosing Weight: 69.9 kg  Vital Signs: Temp: 98.3 F (36.8 C) (12/16 0417) BP: 106/63 (12/16 0417) Pulse Rate: 88 (12/16 0417)  Labs: Recent Labs    11/06/18 1653 11/06/18 2124  11/07/18 1244 11/08/18 0431 11/08/18 1341 11/09/18 0003 11/09/18 0431  HGB 11.1*  --   --  10.5* 8.7*  --   --  9.1*  HCT 37.2  --   --  35.1* 30.2*  --   --  30.2*  PLT 198  --   --  179 159  --   --  172  APTT  --   --    < > 44* 124* 111* 148* 90*  LABPROT  --   --   --  17.3*  --   --   --   --   INR  --   --   --  1.43  --   --   --   --   HEPARINUNFRC  --   --    < > 2.42* 2.54* 2.38*  --  1.65*  CREATININE 1.41* 1.15*  --   --   --  0.94  --   --   TROPONINI 0.05* 0.06*  --   --   --   --   --   --    < > = values in this interval not displayed.    Estimated Creatinine Clearance: 36.7 mL/min (by C-G formula based on SCr of 0.94 mg/dL).   Assessment: 84 year old female admitted with weakness and lethargy. Also had hypoxia, tachypnea and tachycardia with concern for PE. Patient takes Eliquis 2.5 mg BID PTA with last dose 12/14 at 0800. Pharmacy consulted for heparin drip for suspected PE.  Per Dr. Posey Pronto, will start heparin drip this evening at time of next dose of Eliquis. Patient is unable to take PO medications at this time so we can not restart Eliquis.  12/14 Heparin drip started @ 1000 units/hr 12/15 AM: heparin drip reduced to 900 units/hr.   Goal of Therapy:  Heparin level 0.3-0.7 units/ml aPTT 66-102 seconds Monitor  platelets by anticoagulation protocol: Yes   Plan:  12/15 1500 aPTT 111. Level if slightly supratherapeutic. Will decrease heparin infusion to 800 units/hr. Recheck aPTT 8 hours after heparin rate change.  CBC and heparin level ordered with AM labs per protocol.   12/16 0100 aPTT 148. Hold drip ~45 minutes and restart at 600 units/hr. Recheck aPTT in 6 hours.  12/16 AM labs aPTT 90 drawn 2 h after rate change. Continue current rate and recheck aPTT in 6h. AM heparin level still elevated, not yet correlating. CBC stable from yesterday (Hgb and plt count improved). Recheck next HL and CBC in AM.   Pharmacy to continue to follow.  Rocky Morel, PharmD, BCPS Clinical Pharmacist 11/09/2018 7:35 AM

## 2018-11-09 NOTE — Evaluation (Signed)
Clinical/Bedside Swallow Evaluation Patient Details  Name: Carol Bryan MRN: [id number] Date of Birth: 1923-05-02  Today's Date: 11/09/2018 Time: SLP Start Time (ACUTE ONLY): 1215 SLP Stop Time (ACUTE ONLY): 1315 SLP Time Calculation (min) (ACUTE ONLY): 60 min  Past Medical History:  Past Medical History:  Diagnosis Date  . Atrial flutter ([place])    cardioverted  . Cancer [facility])    recent dx'd with Lung and Liver cancer per daughter.  . Collagen vascular disease ([place])    RA  . DVT (deep venous thrombosis) ([place])   . HLD (hyperlipidemia)   . Hypertension   . Hypothyroid   . Pulmonary embolism ([place])   . RA (rheumatoid arthritis) ([place])   . Sjogren's disease [facility])    Past Surgical History:  Past Surgical History:  Procedure Laterality Date  . ABDOMINAL HYSTERECTOMY    . APPENDECTOMY    . ASD REPAIR  age 54  . cataracts    . JOINT REPLACEMENT Left    elbow  . PATENT DUCTUS ARTERIOUS REPAIR     HPI:  Pt is a 84 y.o. female with a known history of Sojgren's Dis., "Esophageal spasms" per family, atrial flutter, recently diagnosed metastatic cancer with unknown primary to the lungs and liver, history of DVT and pulmonary embolism, rheumatoid arthritis, essential hypertension, hypothyroidism who was hospitalized recently with diverticulitis.  Patient also was noted to have lesions in her liver and lung.  Further work-up revealed her to have likely metastatic cancer.  PET scan confirmed this.  She is in the process of having this worked up when she started becoming very weak.  Patient was brought to the emergency room yesterday unable to ambulate.  She was also having severe abdominal pain therefore had a CT scan of the abdomen as well.  Patient overnight started having elevated heart rate as well as hypoxia requiring oxygen now.    Assessment / Plan / Recommendation Clinical Impression  Pt appeared to present w/ adequate oropharyngeal phase swallowing function w/ no overt s/s of  aspiration noted w/ the few trials she had during the beginning of the Lunch meal. Pt endorsed she was "not hungry" but stated she knew she had to "try to eat". Pt consumed trials of thin liquids via Cup and bites of a sandwich moistened by Soup. Pt was able to demonstrate adequate oral phase control for bolus management, mastication, A-P transfer for swallowing. During swallowing, no overt s/s of aspiration noted; clear vocal quality followed b/t trials w/ no decline in respiratory status during/post trials. Pt fed self w/ min setup assistance. No OM weakness noted. The majority of the session was spent on education on aspiration precautions, ways to support oral intake safey and while lessening the work effort, diet consistency, and food options/preparation. Recommend continue current diet as ordered w/ thin liquids; general aspiration precautions; Reflux precautions d/t the Sojgren's Dis. Recommend tray setup and assistance w/ upright positioning. Recommend f/u by Dietitian for nutritional support including more mini, frequent meals/snacks. Recommend Pills in puree for easier, safer clearing/swallowing. No further skilled ST services indicated at pt appears at her baseline. Thorough discussion was had w/ pt and Dtrs in room. NSG updated.  SLP Visit Diagnosis: Dysphagia, unspecified (R13.10)(more of an Esophageal phase - Sojgren's Dis.)    Aspiration Risk  (reduced following general precautions)    Diet Recommendation  Mech Soft diet w/ thin liquids - moistened, small-cut pieces of foods; general aspiration precautions. Reflux precautions  Medication Administration: Whole meds with puree(for safer,  easier swallowing/clearing)    Other  Recommendations Recommended Consults: (Dietitian f/u; GI consult if indicated ) Oral Care Recommendations: Oral care BID;Patient independent with oral care;Staff/trained caregiver to provide oral care Other Recommendations: (n/a)   Follow up Recommendations None       Frequency and Duration (n/a)  (n/a)       Prognosis Prognosis for Safe Diet Advancement: Fair(-Good) Barriers to Reach Goals: (GI dysmotility baseline)      Swallow Study   General Date of Onset: 11/07/18 HPI: Pt is a 84 y.o. female with a known history of Sojgren's Dis., "Esophageal spasms" per family, atrial flutter, recently diagnosed metastatic cancer with unknown primary to the lungs and liver, history of DVT and pulmonary embolism, rheumatoid arthritis, essential hypertension, hypothyroidism who was hospitalized recently with diverticulitis.  Patient also was noted to have lesions in her liver and lung.  Further work-up revealed her to have likely metastatic cancer.  PET scan confirmed this.  She is in the process of having this worked up when she started becoming very weak.  Patient was brought to the emergency room yesterday unable to ambulate.  She was also having severe abdominal pain therefore had a CT scan of the abdomen as well.  Patient overnight started having elevated heart rate as well as hypoxia requiring oxygen now.  Type of Study: Bedside Swallow Evaluation Previous Swallow Assessment: none reported Diet Prior to this Study: Regular;Thin liquids Temperature Spikes Noted: No(wbc 4.6) Respiratory Status: Nasal cannula(3-4 liters) History of Recent Intubation: No Behavior/Cognition: Alert;Cooperative;Pleasant mood;Distractible;Requires cueing(Fatigued appearing) Oral Cavity Assessment: Within Functional Limits Oral Care Completed by SLP: Recent completion by staff Oral Cavity - Dentition: Adequate natural dentition Vision: Functional for self-feeding Self-Feeding Abilities: Able to feed self;Needs assist;Needs set up Patient Positioning: Upright in bed(needed positioning) Baseline Vocal Quality: Normal;Low vocal intensity(min) Volitional Swallow: Able to elicit    Oral/Motor/Sensory Function Overall Oral Motor/Sensory Function: Within functional limits(w/ bolus  management; conversation)   Ice Chips Ice chips: Not tested   Thin Liquid Thin Liquid: Within functional limits Presentation: Cup;Self Fed(2-3 sips)    Nectar Thick Nectar Thick Liquid: Not tested   Honey Thick Honey Thick Liquid: Not tested   Puree Puree: Not tested   Solid     Solid: Within functional limits Presentation: Self Fed(2 bites of sandwich) Other Comments: "I know I must try to eat something" when lunch meal was brought       Orinda Kenner, MS, CCC-SLP , 11/09/2018,3:14 PM

## 2018-11-09 NOTE — Clinical Social Work Note (Signed)
CSW spoke with patient's daughters at bedside. CSW presented bed offers and quality information via [email or web address]. Family does not like the offers available. Family prefers [facility]. CSW explained that [facility] has not offered a bed at this time. Family will discuss before making a decision. CSW will continue to follow.   [place], [place]

## 2018-11-09 NOTE — Progress Notes (Signed)
New referral for outpatient Palliative to follow at SNF received from [facility]. Awaiting facility choice. Patient information faxed to referral. Flo Shanks RN, BSN, [facility] [place], hospital Liaison [phone]

## 2018-11-09 NOTE — Progress Notes (Signed)
[place] for heparin Indication: pulmonary embolus  Allergies  Allergen Reactions  . Ciprofloxacin Other (See Comments)    Foot burning; tolerates levaquin  . Methotrexate Other (See Comments)    leukopenia  . Gold-Containing Drug Products Rash  . Other Other (See Comments) and Rash  . Sulfa Antibiotics Rash    Patient Measurements: Height: 5\' 5"  (165.1 cm) Weight: 154 lb (69.9 kg) IBW/kg (Calculated) : 57 Heparin Dosing Weight: 69.9 kg  Vital Signs: Temp: 97.9 F (36.6 C) (12/15 2050) Temp Source: Oral (12/15 1535) BP: 122/70 (12/15 2050) Pulse Rate: 110 (12/15 2050)  Labs: Recent Labs    11/06/18 1653 11/06/18 2124  11/07/18 1244 11/08/18 0431 11/08/18 1341 11/09/18 0003  HGB 11.1*  --   --  10.5* 8.7*  --   --   HCT 37.2  --   --  35.1* 30.2*  --   --   PLT 198  --   --  179 159  --   --   APTT  --   --    < > 44* 124* 111* 148*  LABPROT  --   --   --  17.3*  --   --   --   INR  --   --   --  1.43  --   --   --   HEPARINUNFRC  --   --   --  2.42* 2.54* 2.38*  --   CREATININE 1.41* 1.15*  --   --   --  0.94  --   TROPONINI 0.05* 0.06*  --   --   --   --   --    < > = values in this interval not displayed.    Estimated Creatinine Clearance: 36.7 mL/min (by C-G formula based on SCr of 0.94 mg/dL).   Medical History: Past Medical History:  Diagnosis Date  . Atrial flutter ([place])    cardioverted  . Cancer [facility])    recent dx'd with Lung and Liver cancer per daughter.  . Collagen vascular disease ([place])    RA  . DVT (deep venous thrombosis) ([place])   . HLD (hyperlipidemia)   . Hypertension   . Hypothyroid   . Pulmonary embolism ([place])   . RA (rheumatoid arthritis) ([place])   . Sjogren's disease [facility])    Assessment: 84 year old female admitted with weakness and lethargy. Also had hypoxia, tachypnea and tachycardia with concern for PE. Patient takes Eliquis 2.5 mg BID PTA with last dose 12/14 at 0800. Pharmacy consulted  for heparin drip for suspected PE.  Per Dr. Posey Pronto, will start heparin drip this evening at time of next dose of Eliquis. Patient is unable to take PO medications at this time so we can not restart Eliquis.  12/14 Heparin drip started @ 1000 units/hr 12/15 AM: heparin drip reduced to 900 units/hr.   Goal of Therapy:  Heparin level 0.3-0.7 units/ml aPTT 66-102 seconds Monitor platelets by anticoagulation protocol: Yes   Plan:  12/15 1500 aPTT 111. Level if slightly supratherapeutic. Will decrease heparin infusion to 800 units/hr. Recheck aPTT 8 hours after heparin rate change.  CBC and heparin level ordered with AM labs per protocol.   12/16 0100 aPTT 148. Hold drip ~45 minutes and restart at 600 units/hr. Recheck aPTT in 6 hours.  Pharmacy to continue to follow.  [name], PharmD, BCPS Clinical Pharmacist 11/09/2018 1:10 AM

## 2018-11-09 NOTE — Plan of Care (Signed)

## 2018-11-09 NOTE — Progress Notes (Signed)
[facility]                                                                                                                                                                                  Patient Demographics   Carol Bryan, is a 84 y.o. female, DOB - 03-23-1923, KDX:[id number]  Admit date - 11/06/2018   Admitting Physician Dustin Flock, MD  Outpatient Primary MD for the patient is [name], MD   LOS - 2  Subjective: Patient having issues with urinary retention Intermittent abdominal pain  Review of Systems:   CONSTITUTIONAL: No documented fever. No fatigue, weakness. No weight gain, no weight loss.  EYES: No blurry or double vision.  ENT: No tinnitus. No postnasal drip. No redness of the oropharynx.  RESPIRATORY: No cough, no wheeze, no hemoptysis. No dyspnea.  CARDIOVASCULAR: No chest pain. No orthopnea. No palpitations. No syncope.  GASTROINTESTINAL: No nausea, no vomiting or diarrhea.  Positive abdominal pain. No melena or hematochezia.  GENITOURINARY: No dysuria or hematuria.  ENDOCRINE: No polyuria or nocturia. No heat or cold intolerance.  HEMATOLOGY: No anemia. No bruising. No bleeding.  INTEGUMENTARY: No rashes. No lesions.  MUSCULOSKELETAL: No arthritis. No swelling. No gout.  NEUROLOGIC: No numbness, tingling, or ataxia. No seizure-type activity.  PSYCHIATRIC: No anxiety. No insomnia. No ADD.    Vitals:   Vitals:   11/08/18 1535 11/08/18 1856 11/08/18 2050 11/09/18 0417  BP: 116/68  122/70 106/63  Pulse: 80  (!) 110 88  Resp: 20  17 17   Temp: (!) 97.5 F (36.4 C) 98.2 F (36.8 C) 97.9 F (36.6 C) 98.3 F (36.8 C)  TempSrc: Oral     SpO2: 98%  98% 96%  Weight:      Height:        Wt Readings from Last 3 Encounters:  11/06/18 69.9 kg  10/20/18 71.4 kg  10/03/18 70 kg     Intake/Output Summary (Last 24 hours) at 11/09/2018 1302 Last data filed at 11/09/2018 0948 Gross per 24 hour  Intake 248.31 ml   Output 1300 ml  Net -1051.69 ml    Physical Exam:   GENERAL: Chronically ill appearing HEAD, EYES, EARS, NOSE AND THROAT: Atraumatic, normocephalic. Extraocular muscles are intact. Pupils equal and reactive to light. Sclerae anicteric. No conjunctival injection. No oro-pharyngeal erythema.  NECK: Supple. There is no jugular venous distention. No bruits, no lymphadenopathy, no thyromegaly.  HEART: Regular rate and rhythm,. No murmurs, no rubs, no clicks.  LUNGS: Clear to auscultation bilaterally. No rales or rhonchi. No wheezes.  ABDOMEN: Soft, flat, nontender, nondistended. Has good bowel sounds. No hepatosplenomegaly appreciated.  EXTREMITIES: No  evidence of any cyanosis, clubbing, or 1+ peripheral edema.  +2 pedal and radial pulses bilaterally.  NEUROLOGIC: The patient is alert, awake, and oriented x3 with no focal motor or sensory deficits appreciated bilaterally.  SKIN: Moist and warm with no rashes appreciated.  Psych: Not anxious, depressed LN: No inguinal LN enlargement    Antibiotics   Anti-infectives (From admission, onward)   Start     Dose/Rate Route Frequency Ordered Stop   11/07/18 1000  levofloxacin (LEVAQUIN) tablet 500 mg  Status:  Discontinued     500 mg Oral Daily 11/06/18 2211 11/07/18 1432   11/07/18 1000  hydroxychloroquine (PLAQUENIL) tablet 200 mg     200 mg Oral Daily 11/06/18 2212     11/06/18 1945  cefTRIAXone (ROCEPHIN) 1 g in sodium chloride 0.9 % 100 mL IVPB     1 g 200 mL/hr over 30 Minutes Intravenous  Once 11/06/18 1937 11/06/18 2326      Medications   Scheduled Meds: . amitriptyline  20 mg Oral QHS  . apixaban  2.5 mg Oral BID  . cholecalciferol  1,000 Units Oral Daily  . folic acid  3 mg Oral Daily  . furosemide  20 mg Oral BID  . hydroxychloroquine  200 mg Oral Daily  . levothyroxine  50 mcg Oral QAC breakfast  . metoprolol succinate  12.5 mg Oral Daily  . pantoprazole  40 mg Oral Daily  . potassium chloride SA  20 mEq Oral Daily  .  predniSONE  5 mg Oral BID WC   Continuous Infusions:  PRN Meds:.acetaminophen **OR** acetaminophen, azelastine, HYDROcodone-acetaminophen, HYDROmorphone (DILAUDID) injection, ondansetron **OR** ondansetron (ZOFRAN) IV, polyvinyl alcohol, prochlorperazine   Data Review:   Micro Results Recent Results (from the past 240 hour(s))  Urine culture     Status: Abnormal   Collection Time: 11/06/18  6:38 PM  Result Value Ref Range Status   Specimen Description   Final    URINE, RANDOM Performed at [facility], [address] [place], [place] [address]    Special Requests   Final    NONE Performed at [facility], [address] [place], [place] [address]    Culture >=100,000 COLONIES/mL ENTEROBACTER AEROGENES (A)  Final   Report Status 11/09/2018 FINAL  Final   Organism ID, Bacteria ENTEROBACTER AEROGENES (A)  Final      Susceptibility   Enterobacter aerogenes - MIC*    CEFAZOLIN >=64 RESISTANT Resistant     CEFTRIAXONE >=64 RESISTANT Resistant     CIPROFLOXACIN <=0.25 SENSITIVE Sensitive     GENTAMICIN <=1 SENSITIVE Sensitive     IMIPENEM 1 SENSITIVE Sensitive     NITROFURANTOIN 64 INTERMEDIATE Intermediate     TRIMETH/SULFA <=20 SENSITIVE Sensitive     PIP/TAZO >=128 RESISTANT Resistant     * >=100,000 COLONIES/mL ENTEROBACTER AEROGENES    Radiology Reports Dg Chest 2 View  Result Date: 11/06/2018 CLINICAL DATA:  Increasing weakness. Liver lung metastases of unknown primary. EXAM: CHEST - 2 VIEW COMPARISON:  Thoracic spine x-ray November 04, 2018 FINDINGS: A nodule or mass in the right upper lobe is again identified. A torturous thoracic aorta is stable. The heart, hila, and mediastinum are unchanged. No pneumothorax. No focal infiltrate. Minimal atelectasis in the left base. The suspected compression fracture of a lower thoracic vertebral bodies seen on the November 04, 2018 thoracic spine x-ray is unchanged. IMPRESSION: 1. Again noted is a lower thoracic  vertebral body compression fracture, thought to be new on the November 04, 2018 comparison. 2. Persistent nodule/mass in the right upper lung. Electronically Signed   By: Dorise Bullion III M.D   On: 11/06/2018 17:24   Dg Thoracic Spine 2 View  Result Date: 11/04/2018 CLINICAL DATA:  Fall with left-sided back pain EXAM: THORACIC SPINE 2 VIEWS; LUMBAR SPINE - COMPLETE 4+ VIEW COMPARISON:  Chest radiograph 09/30/2018 FINDINGS: THORACIC SPINE: There is moderate height loss of T10 that appears new compared to the chest radiograph 09/30/2018. No other focal abnormality is visualized within the thoracic spine. Alignment of the thoracic spine is normal. LUMBAR SPINE: There is unchanged grade 1 anterolisthesis at L3-L4. No lumbar spine fracture. IMPRESSION: Mild-to-moderate height loss of T10, likely new compared to chest radiograph 09/30/2018 and concerning for fracture. MRI may be helpful for further assessment. These results will be called to the ordering clinician or representative by the Radiologist Assistant, and communication documented in the PACS or zVision Dashboard. Electronically Signed   By: Ulyses Jarred M.D.   On: 11/04/2018 16:50   Dg Lumbar Spine Complete  Result Date: 11/04/2018 CLINICAL DATA:  Fall with left-sided back pain EXAM: THORACIC SPINE 2 VIEWS; LUMBAR SPINE - COMPLETE 4+ VIEW COMPARISON:  Chest radiograph 09/30/2018 FINDINGS: THORACIC SPINE: There is moderate height loss of T10 that appears new compared to the chest radiograph 09/30/2018. No other focal abnormality is visualized within the thoracic spine. Alignment of the thoracic spine is normal. LUMBAR SPINE: There is unchanged grade 1 anterolisthesis at L3-L4. No lumbar spine fracture. IMPRESSION: Mild-to-moderate height loss of T10, likely new compared to chest radiograph 09/30/2018 and concerning for fracture. MRI may be helpful for further assessment. These results will be called to the ordering clinician or representative by the  Radiologist Assistant, and communication documented in the PACS or zVision Dashboard. Electronically Signed   By: Ulyses Jarred M.D.   On: 11/04/2018 16:50   Ct Head Wo Contrast  Result Date: 11/06/2018 CLINICAL DATA:  Increasing weakness and lethargy today. History of atrial fibrillation and lung cancer. EXAM: CT HEAD WITHOUT CONTRAST TECHNIQUE: Contiguous axial images were obtained from the base of the skull through the vertex without intravenous contrast. COMPARISON:  CT head 05/12/2017.  PET-CT 10/29/2018. FINDINGS: Brain: There is no evidence of acute intracranial hemorrhage, mass lesion, brain edema or extra-axial fluid collection. There is atrophy with diffuse prominence of the ventricles and subarachnoid spaces. There is chronic low-density in the periventricular white matter and chronic symmetric calcifications of the basal ganglia. There is no CT evidence of acute cortical infarction. Vascular: Extensive intracranial vascular calcifications. No hyperdense vessel identified. Skull: Negative for fracture or focal lesion. Sinuses/Orbits: The visualized paranasal sinuses and mastoid air cells are clear. No orbital abnormalities are seen. Other: None. IMPRESSION: Stable head CT without acute intracranial findings. Stable atrophy and chronic small vessel ischemic changes. Electronically Signed   By: Richardean Sale M.D.   On: 11/06/2018 17:33   Ct Chest Wo Contrast  Result Date: 11/07/2018 CLINICAL DATA:  Increased shortness of breath and weakness starting yesterday. Recent diagnosis of metastatic cancer of uncertain primary. EXAM: CT CHEST WITHOUT CONTRAST TECHNIQUE: Multidetector CT imaging of the chest was performed following the standard protocol without IV contrast. COMPARISON:  PET-CT October 29, 2018 FINDINGS: Cardiovascular: Atherosclerotic changes seen in the nonaneurysmal aorta. The central pulmonary arteries are normal in caliber. Coronary artery calcifications are identified. The heart is  unchanged. Mediastinum/Nodes: The thyroid and esophagus are unremarkable. There is a small left pleural effusion. No other effusions. No adenopathy. Lungs/Pleura: Central airways  are normal. No pneumothorax. The nodule in the right upper lobe on series 3, image 42 measures 3.1 cm today versus 2.4 cm on the comparison PET-CT. The nodule in the superior segment of the left lower lobe measures 2.1 cm today and on the previous study. This nodule is seen on series 3, image 61 today. The nodule described in the left lower lobe on the previous study is difficult to measure today due to adjacent infiltrate. The nodule described in the deep right posterior lung base on the previous study is also difficult to measure due to adjacent atelectasis but appears larger in the mid interval measuring 2 cm today versus 1.7 cm previously. Numerous other pulmonary nodules are stable to larger in the interval. There is atelectasis in the right middle lobe. There is atelectasis posteriorly in the right base. Opacity in the left base with an associated small effusion could represent atelectasis or infiltrate/pneumonia. Upper Abdomen: Hepatic steatosis. Known liver lesions are not well assessed on this unenhanced study. No splenic lesions are also not as well seen without contrast. No other changes in the upper abdomen. Musculoskeletal: 20% loss of anterior height of T10 is stable since November 04, 2018 x-rays. IMPRESSION: 1. Bilateral pulmonary nodules, thought to represent metastatic disease on the recent PET-CT, have overall worsened in the interval. 2. There is a small left effusion with underlying opacity. The underlying opacity could represent atelectasis or pneumonia. Recommend clinical correlation and follow-up to resolution. 3. Atherosclerotic change in the nonaneurysmal aorta. Coronary artery calcifications. 4. Known hepatic and splenic lesions are not well seen on today's study due to lack of contrast. 5. Stable mild compression  fracture of T10. Aortic Atherosclerosis (ICD10-I70.0). Electronically Signed   By: Dorise Bullion III M.D   On: 11/07/2018 10:43   Ct Abdomen Pelvis W Contrast  Result Date: 11/07/2018 CLINICAL DATA:  Constipation. Nausea and vomiting. Acute abdominal pain. Patient's family reports weakness and lethargy. EXAM: CT ABDOMEN AND PELVIS WITH CONTRAST TECHNIQUE: Multidetector CT imaging of the abdomen and pelvis was performed using the standard protocol following bolus administration of intravenous contrast. CONTRAST:  35mL OMNIPAQUE IOHEXOL 300 MG/ML  SOLN COMPARISON:  PET CT 10/29/2018, abdominal CT 09/30/2018 FINDINGS: Lower chest: Basilar pulmonary nodules which are recently characterized on PET. Small left pleural effusion which is new. There is adjacent basilar atelectasis. Hepatobiliary: Multiple ill-defined hypodense liver lesions, difficult to define but grossly similar to prior exam. Gallbladder physiologically distended, no calcified stone. No biliary dilatation. Small volume of perihepatic ascites. Pancreas: No ductal dilatation or inflammation. Spleen: Splenomegaly as seen on prior exams, the spleen spans 16.2 cm cranial caudal. Multiple splenic infarcts which have progressed from prior contrast-enhanced abdominal CT. Infarct in the anterior upper spleen has adjacent stranding. Small amount of perisplenic fluid. Multiple additional low-density splenic lesions. Adrenals/Urinary Tract: No adrenal nodule. No hydronephrosis or perinephric edema. Left renal cysts appear similar to prior exam. Urinary bladder is distended. Pelvic floor laxity with cystocele again seen, less pronounced than on prior exam. Stomach/Bowel: Again seen large proximal duodenal diverticulum and small bowel malrotation with duodenum not crossing the midline. Contrast in fluid-filled small bowel that is not dilated or obstructed. Appendix not visualized, prior appendectomy per history. Colonic diverticulosis with resolution of sigmoid  diverticulitis from prior exam. Cecum slightly high-riding in the right mid abdomen. Moderate to large volume of colonic stool. Pelvic floor laxity with rectocele, suture line at the and rectal junction. Vascular/Lymphatic: Aortic atherosclerosis and tortuosity. 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 Splenic vein is tortuous and prominent but no evidence of splenic vein thrombosis. 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 This is unchanged from prior exam. No adenopathy. Reproductive: Status post hysterectomy. No adnexal masses. Other: Small amount of perihepatic and perisplenic free fluid. No free air. No intra-abdominal abscess. No abdominal wall hernia. Musculoskeletal: T10 compression fracture is new from prior CT but unchanged from lumbar spine radiograph 11/04/2018. Degenerative change throughout spine. No focal osseous lesion. IMPRESSION: 1. Moderate colonic stool burden suggesting constipation. No bowel obstruction. 2. Splenomegaly with multiple splenic infarcts which have progressed from prior contrast-enhanced abdominal CT. 3. Hepatic metastatic disease, better characterized on recent PET. Lung base pulmonary nodules also better assessed on PET. 4. Small amount of perihepatic and perisplenic ascites. Small left pleural effusion is new from prior exam. 5. Distended urinary bladder, cystocele again seen but less prominent than on prior. 6. Colonic diverticulosis with resolution of sigmoid diverticulitis from prior exam. 7. Chronic findings include small splenic artery aneurysm, duodenal diverticulum with partial bowel malrotation, pelvic floor laxity, and Aortic atherosclerosis (ICD10-I70.0). 8. Mild to moderate T10 compression fracture, unchanged from recent radiographs but new from prior CT. Electronically Signed   By: Keith Rake M.D.   On: 11/07/2018 04:38   Nm  Pet Image Initial (pi) Skull Base To Thigh  Result Date: 10/29/2018 CLINICAL DATA:  Initial treatment strategy for pulmonary nodules. EXAM: NUCLEAR MEDICINE PET SKULL BASE TO THIGH TECHNIQUE: 8.79 mCi F-18 FDG was injected intravenously. Full-ring PET imaging was performed from the skull base to thigh after the radiotracer. CT data was obtained and used for attenuation correction and anatomic localization. Fasting blood glucose: 92 mg/dl COMPARISON:  CT scan 09/30/2018 FINDINGS: Mediastinal blood pool activity: SUV max 2.67 NECK: No hypermetabolic lymph nodes in the neck. Incidental CT findings: none CHEST: 23.5 mm right upper lobe pulmonary nodule on image number 70 is hypermetabolic with SUV max of 7.06. 20 mm superior segment left lower lobe pulmonary nodule on image number 80 is hypermetabolic with SUV max of 2.37. 17 mm left lower lobe nodule on image number 628 is hypermetabolic with SUV max of 3.15. 17 mm right basilar nodule on image number 176 is hypermetabolic with SUV max of 1.60. Other small scattered pulmonary nodules are again noted. No enlarged or hypermetabolic mediastinal or hilar lymph nodes. No breast masses, supraclavicular or axillary adenopathy. Incidental CT findings: Stable emphysematous changes and pulmonary scarring. Stable advanced vascular calcifications with three-vessel coronary artery calcifications. ABDOMEN/PELVIS: The segment 5 liver lesion is hypermetabolic and appears larger but difficult to measure without contrast SUV max is 9.06. Smaller lesion in segment 6 is hypermetabolic with SUV max of 7.37. The spleen is diffusely hypermetabolic with multiple splenic lesions as demonstrated on the previous CT scan. No adrenal gland lesions.  No abdominal/pelvic lymphadenopathy. Incidental CT findings: Severe sigmoid colon diverticulosis without findings for acute diverticulitis. SKELETON: No findings for osseous metastatic disease. Incidental CT findings: none IMPRESSION: 1. Multiple  hypermetabolic pulmonary nodules most consistent with metastatic disease. No enlarged or hypermetabolic mediastinal or hilar lymph nodes. 2. Hypermetabolic hepatic metastasis. 3. Enlarged and diffusely hypermetabolic spleen. 4. No abdominal/pelvic lymphadenopathy. 5. No obvious primary tumor is identified. A liver biopsy may be the safest option. Electronically Signed   By: Marijo Sanes M.D.   On: 10/29/2018 20:41     CBC Recent Labs  Lab 11/06/18  1653 11/07/18 1244 11/08/18 0431 11/09/18 0431  WBC 3.8* 3.7* 3.4* 4.5  HGB 11.1* 10.5* 8.7* 9.1*  HCT 37.2 35.1* 30.2* 30.2*  PLT 198 179 159 172  MCV 92.5 92.6 96.5 92.9  MCH 27.6 27.7 27.8 28.0  MCHC 29.8* 29.9* 28.8* 30.1  RDW 16.9* 17.2* 17.2* 17.2*    Chemistries  Recent Labs  Lab 11/06/18 1653 11/06/18 2124 11/06/18 2130 11/08/18 1341  NA 132* 134*  --  135  K 4.4 4.0  --  4.3  CL 98 103  --  107  CO2 23 23  --  17*  GLUCOSE 121* 99  --  73  BUN 29* 24*  --  23  CREATININE 1.41* 1.15*  --  0.94  CALCIUM 8.3* 7.4*  --  7.1*  AST  --   --  25  --   ALT  --   --  14  --   ALKPHOS  --   --  149*  --   BILITOT  --   --  0.7  --    ------------------------------------------------------------------------------------------------------------------ estimated creatinine clearance is 36.7 mL/min (by C-G formula based on SCr of 0.94 mg/dL). ------------------------------------------------------------------------------------------------------------------ No results for input(s): HGBA1C in the last 72 hours. ------------------------------------------------------------------------------------------------------------------ No results for input(s): CHOL, HDL, LDLCALC, TRIG, CHOLHDL, LDLDIRECT in the last 72 hours. ------------------------------------------------------------------------------------------------------------------ No results for input(s): TSH, T4TOTAL, T3FREE, THYROIDAB in the last 72 hours.  Invalid input(s):  FREET3 ------------------------------------------------------------------------------------------------------------------ No results for input(s): VITAMINB12, FOLATE, FERRITIN, TIBC, IRON, RETICCTPCT in the last 72 hours.  Coagulation profile Recent Labs  Lab 11/07/18 1244  INR 1.43    No results for input(s): DDIMER in the last 72 hours.  Cardiac Enzymes Recent Labs  Lab 11/06/18 1653 11/06/18 2124  TROPONINI 0.05* 0.06*   ------------------------------------------------------------------------------------------------------------------ Invalid input(s): POCBNP    Assessment & Plan   IMPRESSION AND PLAN: Patient is a 84 year old with cancer with unknown primary with mets  1.  Acute hypoxic respiratory failure  This is related to progression of her metastatic cancer to her lungs based on CT scan  Continue oxygen therapy Change Lasix back to oral 2.  Abdominal pain suspect due to combination of constipation and splenic infarct We will give patient 1 bottle of mag citrate   3.  Metastatic cancer supportive care prognosis poor  4.    Urinary retention do in and out cath if persist will need a Foley  5.  History of rheumatoid arthritis continue prednisone twice daily  6.    History of atrial flutter and history of DVT discontinue heparin resume Eliquis  7.  Disposition plan to be discharged tomorrow to skilled nursing facility with palliative care     Code Status Orders  (From admission, onward)         Start     Ordered   11/07/18 0945  Do not attempt resuscitation (DNR)  Continuous    Question Answer Comment  In the event of cardiac or respiratory ARREST Do not call a "code blue"   In the event of cardiac or respiratory ARREST Do not perform Intubation, CPR, defibrillation or ACLS   In the event of cardiac or respiratory ARREST Use medication by any route, position, wound care, and other measures to relive pain and suffering. May use oxygen, suction and  manual treatment of airway obstruction as needed for comfort.      11/07/18 0946        Code Status History    Date Active Date Inactive  Code Status Order ID Comments User Context   09/30/2018 2213 10/03/2018 1750 Full Code [id number]  Amelia Jo, MD ED   06/08/2018 0431 06/10/2018 1523 Full Code [id number]  Harrie Foreman, MD Inpatient   10/29/2017 1616 10/30/2017 1653 Full Code [id number]  Loletha Grayer, MD ED   12/24/2016 2056 12/26/2016 1817 Full Code [id number]  Lance Coon, MD Inpatient    Advance Directive Documentation     Most Recent Value  Type of Advance Directive  Healthcare Power of [place], Living will  Pre-existing out of facility DNR order (yellow form or pink MOST form)  -  "MOST" Form in Place?  -           Consults none  DVT Prophylaxis Heparin Lab Results  Component Value Date   PLT 172 11/09/2018     Time Spent in minutes 35 minutes greater than 50% of time spent in care coordination and counseling patient regarding the condition and plan of care.   Dustin Flock M.D on 11/09/2018 at 1:02 PM  Between 7am to 6pm - Pager - [phone]  After 6pm go to [email or web address] - Proofreader  [employer]   Office  [phone]

## 2018-11-10 DIAGNOSIS — J9601 Acute respiratory failure with hypoxia: Principal | ICD-10-CM

## 2018-11-10 DIAGNOSIS — Z515 Encounter for palliative care: Secondary | ICD-10-CM

## 2018-11-10 LAB — CBC
HCT: 32.1 % — ABNORMAL LOW (ref 36.0–46.0)
Hemoglobin: 9.4 g/dL — ABNORMAL LOW (ref 12.0–15.0)
MCH: 27.1 pg (ref 26.0–34.0)
MCHC: 29.3 g/dL — ABNORMAL LOW (ref 30.0–36.0)
MCV: 92.5 fL (ref 80.0–100.0)
PLATELETS: 179 10*3/uL (ref 150–400)
RBC: 3.47 MIL/uL — ABNORMAL LOW (ref 3.87–5.11)
RDW: 17.4 % — ABNORMAL HIGH (ref 11.5–15.5)
WBC: 3.1 10*3/uL — ABNORMAL LOW (ref 4.0–10.5)
nRBC: 0 % (ref 0.0–0.2)

## 2018-11-10 MED ORDER — POLYETHYLENE GLYCOL 3350 17 G PO PACK
17.0000 g | PACK | Freq: Every day | ORAL | Status: DC
  Administered 2018-11-11: 10:00:00 17 g via ORAL
  Filled 2018-11-10: qty 1

## 2018-11-10 MED ORDER — LEVOFLOXACIN 250 MG PO TABS: 250.0000 mg | ORAL_TABLET | Freq: Every day | ORAL | 0 refills | Status: DC

## 2018-11-10 MED ORDER — DOCUSATE SODIUM 100 MG PO CAPS: 100.0000 mg | ORAL_CAPSULE | Freq: Two times a day (BID) | ORAL | 0 refills | Status: DC

## 2018-11-10 MED ORDER — POLYETHYLENE GLYCOL 3350 17 G PO PACK: 17.0000 g | PACK | Freq: Every day | ORAL | 0 refills | Status: DC

## 2018-11-10 MED ORDER — HYDROCODONE-ACETAMINOPHEN 5-325 MG PO TABS: 1.0000 | ORAL_TABLET | ORAL | 0 refills | Status: AC | PRN

## 2018-11-10 NOTE — Discharge Summary (Signed)
[facility] [place] [facility], 84 y.o., DOB 09-Oct-1923, MRN [id number]. Admission date: 11/06/2018 Discharge Date 11/10/2018 Primary MD Adin Hector, MD Admitting Physician Dustin Flock, MD  Admission Diagnosis  Dehydration [E86.0] Tachycardia [R00.0] Acute cystitis without hematuria [N30.00] Acute respiratory failure with hypoxia ([place]) [J96.01] Contusion of face, initial encounter [S00.83XA] Fall, initial encounter [W19.XXXA]  Discharge Diagnosis   Active Problems: Acute hypoxic respiratory failure related to metastatic nodules in the lung which have gotten progressively worse Abdominal pain due to combination of constipation and splenic infarcts Urinary retention patient will need indwelling Foley catheter History of rheumatoid arthritis History of atrial flutter History of DVT and pulmonary embolism    Hospital Course  Carol Bryan  is a 84 y.o. female with a known history of atrial flutter, recently diagnosed metastatic cancer with unknown primary to the lungs and liver, history of DVT and pulmonary embolism, rheumatoid arthritis, essential hypertension, hypothyroidism who was hospitalized recently with diverticulitis.  Patient also was noted to have lesions in her liver and lung.  Further work-up revealed her to have likely metastatic cancer.  PET scan confirmed this.  She is in the process of having this worked up when she started becoming very weak.  Patient's because of her hypoxia and tachycardia was admitted to the hospital for further evaluation.  She had a CT scan of the chest which showed worsening pulmonary nodules.  Patient also had abdominal CT scan which showed splenic infarcts.  Was admitted and given oxygen therapy.  She also was noted to have UTI which was treated with antibiotics.  Patient developed urinary retention and will need to have Foley kept in.  She also had severe constipation which was treated with mag citrate and  she had 2 bowel movements.  Urinary retention Foley to be kept in: Voiding trials in 3 days Oxygen via 3 L at all times   Relative care team to follow patient at the facility      Consults  pallative care team  Significant Tests:  See full reports for all details     Dg Chest 2 View  Result Date: 11/06/2018 CLINICAL DATA:  Increasing weakness. Liver lung metastases of unknown primary. EXAM: CHEST - 2 VIEW COMPARISON:  Thoracic spine x-ray November 04, 2018 FINDINGS: A nodule or mass in the right upper lobe is again identified. A torturous thoracic aorta is stable. The heart, hila, and mediastinum are unchanged. No pneumothorax. No focal infiltrate. Minimal atelectasis in the left base. The suspected compression fracture of a lower thoracic vertebral bodies seen on the November 04, 2018 thoracic spine x-ray is unchanged. IMPRESSION: 1. Again noted is a lower thoracic vertebral body compression fracture, thought to be new on the November 04, 2018 comparison. 2. Persistent nodule/mass in the right upper lung. Electronically Signed   By: Dorise Bullion III M.D   On: 11/06/2018 17:24   Dg Thoracic Spine 2 View  Result Date: 11/04/2018 CLINICAL DATA:  Fall with left-sided back pain EXAM: THORACIC SPINE 2 VIEWS; LUMBAR SPINE - COMPLETE 4+ VIEW COMPARISON:  Chest radiograph 09/30/2018 FINDINGS: THORACIC SPINE: There is moderate height loss of T10 that appears new compared to the chest radiograph 09/30/2018. No other focal abnormality is visualized within the thoracic spine. Alignment of the thoracic spine is normal. LUMBAR SPINE: There is unchanged grade 1 anterolisthesis at L3-L4. No lumbar spine fracture. IMPRESSION: Mild-to-moderate height loss of T10, likely new compared to chest radiograph 09/30/2018 and concerning for  fracture. MRI may be helpful for further assessment. These results will be called to the ordering clinician or representative by the Radiologist Assistant, and communication  documented in the PACS or zVision Dashboard. Electronically Signed   By: Ulyses Jarred M.D.   On: 11/04/2018 16:50   Dg Lumbar Spine Complete  Result Date: 11/04/2018 CLINICAL DATA:  Fall with left-sided back pain EXAM: THORACIC SPINE 2 VIEWS; LUMBAR SPINE - COMPLETE 4+ VIEW COMPARISON:  Chest radiograph 09/30/2018 FINDINGS: THORACIC SPINE: There is moderate height loss of T10 that appears new compared to the chest radiograph 09/30/2018. No other focal abnormality is visualized within the thoracic spine. Alignment of the thoracic spine is normal. LUMBAR SPINE: There is unchanged grade 1 anterolisthesis at L3-L4. No lumbar spine fracture. IMPRESSION: Mild-to-moderate height loss of T10, likely new compared to chest radiograph 09/30/2018 and concerning for fracture. MRI may be helpful for further assessment. These results will be called to the ordering clinician or representative by the Radiologist Assistant, and communication documented in the PACS or zVision Dashboard. Electronically Signed   By: Ulyses Jarred M.D.   On: 11/04/2018 16:50   Ct Head Wo Contrast  Result Date: 11/06/2018 CLINICAL DATA:  Increasing weakness and lethargy today. History of atrial fibrillation and lung cancer. EXAM: CT HEAD WITHOUT CONTRAST TECHNIQUE: Contiguous axial images were obtained from the base of the skull through the vertex without intravenous contrast. COMPARISON:  CT head 05/12/2017.  PET-CT 10/29/2018. FINDINGS: Brain: There is no evidence of acute intracranial hemorrhage, mass lesion, brain edema or extra-axial fluid collection. There is atrophy with diffuse prominence of the ventricles and subarachnoid spaces. There is chronic low-density in the periventricular white matter and chronic symmetric calcifications of the basal ganglia. There is no CT evidence of acute cortical infarction. Vascular: Extensive intracranial vascular calcifications. No hyperdense vessel identified. Skull: Negative for fracture or focal  lesion. Sinuses/Orbits: The visualized paranasal sinuses and mastoid air cells are clear. No orbital abnormalities are seen. Other: None. IMPRESSION: Stable head CT without acute intracranial findings. Stable atrophy and chronic small vessel ischemic changes. Electronically Signed   By: Richardean Sale M.D.   On: 11/06/2018 17:33   Ct Chest Wo Contrast  Result Date: 11/07/2018 CLINICAL DATA:  Increased shortness of breath and weakness starting yesterday. Recent diagnosis of metastatic cancer of uncertain primary. EXAM: CT CHEST WITHOUT CONTRAST TECHNIQUE: Multidetector CT imaging of the chest was performed following the standard protocol without IV contrast. COMPARISON:  PET-CT October 29, 2018 FINDINGS: Cardiovascular: Atherosclerotic changes seen in the nonaneurysmal aorta. The central pulmonary arteries are normal in caliber. Coronary artery calcifications are identified. The heart is unchanged. Mediastinum/Nodes: The thyroid and esophagus are unremarkable. There is a small left pleural effusion. No other effusions. No adenopathy. Lungs/Pleura: Central airways are normal. No pneumothorax. The nodule in the right upper lobe on series 3, image 42 measures 3.1 cm today versus 2.4 cm on the comparison PET-CT. The nodule in the superior segment of the left lower lobe measures 2.1 cm today and on the previous study. This nodule is seen on series 3, image 61 today. The nodule described in the left lower lobe on the previous study is difficult to measure today due to adjacent infiltrate. The nodule described in the deep right posterior lung base on the previous study is also difficult to measure due to adjacent atelectasis but appears larger in the mid interval measuring 2 cm today versus 1.7 cm previously. Numerous other pulmonary nodules are stable to larger in the  interval. There is atelectasis in the right middle lobe. There is atelectasis posteriorly in the right base. Opacity in the left base with an  associated small effusion could represent atelectasis or infiltrate/pneumonia. Upper Abdomen: Hepatic steatosis. Known liver lesions are not well assessed on this unenhanced study. No splenic lesions are also not as well seen without contrast. No other changes in the upper abdomen. Musculoskeletal: 20% loss of anterior height of T10 is stable since November 04, 2018 x-rays. IMPRESSION: 1. Bilateral pulmonary nodules, thought to represent metastatic disease on the recent PET-CT, have overall worsened in the interval. 2. There is a small left effusion with underlying opacity. The underlying opacity could represent atelectasis or pneumonia. Recommend clinical correlation and follow-up to resolution. 3. Atherosclerotic change in the nonaneurysmal aorta. Coronary artery calcifications. 4. Known hepatic and splenic lesions are not well seen on today's study due to lack of contrast. 5. Stable mild compression fracture of T10. Aortic Atherosclerosis (ICD10-I70.0). Electronically Signed   By: Dorise Bullion III M.D   On: 11/07/2018 10:43   Ct Abdomen Pelvis W Contrast  Result Date: 11/07/2018 CLINICAL DATA:  Constipation. Nausea and vomiting. Acute abdominal pain. Patient's family reports weakness and lethargy. EXAM: CT ABDOMEN AND PELVIS WITH CONTRAST TECHNIQUE: Multidetector CT imaging of the abdomen and pelvis was performed using the standard protocol following bolus administration of intravenous contrast. CONTRAST:  24mL OMNIPAQUE IOHEXOL 300 MG/ML  SOLN COMPARISON:  PET CT 10/29/2018, abdominal CT 09/30/2018 FINDINGS: Lower chest: Basilar pulmonary nodules which are recently characterized on PET. Small left pleural effusion which is new. There is adjacent basilar atelectasis. Hepatobiliary: Multiple ill-defined hypodense liver lesions, difficult to define but grossly similar to prior exam. Gallbladder physiologically distended, no calcified stone. No biliary dilatation. Small volume of perihepatic ascites.  Pancreas: No ductal dilatation or inflammation. Spleen: Splenomegaly as seen on prior exams, the spleen spans 16.2 cm cranial caudal. Multiple splenic infarcts which have progressed from prior contrast-enhanced abdominal CT. Infarct in the anterior upper spleen has adjacent stranding. Small amount of perisplenic fluid. Multiple additional low-density splenic lesions. Adrenals/Urinary Tract: No adrenal nodule. No hydronephrosis or perinephric edema. Left renal cysts appear similar to prior exam. Urinary bladder is distended. Pelvic floor laxity with cystocele again seen, less pronounced than on prior exam. Stomach/Bowel: Again seen large proximal duodenal diverticulum and small bowel malrotation with duodenum not crossing the midline. Contrast in fluid-filled small bowel that is not dilated or obstructed. Appendix not visualized, prior appendectomy per history. Colonic diverticulosis with resolution of sigmoid diverticulitis from prior exam. Cecum slightly high-riding in the right mid abdomen. Moderate to large volume of colonic stool. Pelvic floor laxity with rectocele, suture line at the and rectal junction. Vascular/Lymphatic: Aortic atherosclerosis and tortuosity. 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 Splenic vein is tortuous and prominent but no evidence of splenic vein thrombosis. 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 This is unchanged from prior exam. No adenopathy. Reproductive: Status post hysterectomy. No adnexal masses. Other: Small amount of perihepatic and perisplenic free fluid. No free air. No intra-abdominal abscess. No abdominal wall hernia. Musculoskeletal: T10 compression fracture is new from prior CT but unchanged from lumbar spine radiograph 11/04/2018. Degenerative change throughout spine. No focal osseous lesion. IMPRESSION: 1. Moderate colonic stool burden  suggesting constipation. No bowel obstruction. 2. Splenomegaly with multiple splenic infarcts which have progressed from prior contrast-enhanced abdominal CT.  3. Hepatic metastatic disease, better characterized on recent PET. Lung base pulmonary nodules also better assessed on PET. 4. Small amount of perihepatic and perisplenic ascites. Small left pleural effusion is new from prior exam. 5. Distended urinary bladder, cystocele again seen but less prominent than on prior. 6. Colonic diverticulosis with resolution of sigmoid diverticulitis from prior exam. 7. Chronic findings include small splenic artery aneurysm, duodenal diverticulum with partial bowel malrotation, pelvic floor laxity, and Aortic atherosclerosis (ICD10-I70.0). 8. Mild to moderate T10 compression fracture, unchanged from recent radiographs but new from prior CT. Electronically Signed   By: Keith Rake M.D.   On: 11/07/2018 04:38   Nm Pet Image Initial (pi) Skull Base To Thigh  Result Date: 10/29/2018 CLINICAL DATA:  Initial treatment strategy for pulmonary nodules. EXAM: NUCLEAR MEDICINE PET SKULL BASE TO THIGH TECHNIQUE: 8.79 mCi F-18 FDG was injected intravenously. Full-ring PET imaging was performed from the skull base to thigh after the radiotracer. CT data was obtained and used for attenuation correction and anatomic localization. Fasting blood glucose: 92 mg/dl COMPARISON:  CT scan 09/30/2018 FINDINGS: Mediastinal blood pool activity: SUV max 2.67 NECK: No hypermetabolic lymph nodes in the neck. Incidental CT findings: none CHEST: 23.5 mm right upper lobe pulmonary nodule on image number 70 is hypermetabolic with SUV max of 3.61. 20 mm superior segment left lower lobe pulmonary nodule on image number 80 is hypermetabolic with SUV max of 4.43. 17 mm left lower lobe nodule on image number 154 is hypermetabolic with SUV max of 0.08. 17 mm right basilar nodule on image number 676 is hypermetabolic with SUV max of 1.95. Other small  scattered pulmonary nodules are again noted. No enlarged or hypermetabolic mediastinal or hilar lymph nodes. No breast masses, supraclavicular or axillary adenopathy. Incidental CT findings: Stable emphysematous changes and pulmonary scarring. Stable advanced vascular calcifications with three-vessel coronary artery calcifications. ABDOMEN/PELVIS: The segment 5 liver lesion is hypermetabolic and appears larger but difficult to measure without contrast SUV max is 9.06. Smaller lesion in segment 6 is hypermetabolic with SUV max of 0.93. The spleen is diffusely hypermetabolic with multiple splenic lesions as demonstrated on the previous CT scan. No adrenal gland lesions.  No abdominal/pelvic lymphadenopathy. Incidental CT findings: Severe sigmoid colon diverticulosis without findings for acute diverticulitis. SKELETON: No findings for osseous metastatic disease. Incidental CT findings: none IMPRESSION: 1. Multiple hypermetabolic pulmonary nodules most consistent with metastatic disease. No enlarged or hypermetabolic mediastinal or hilar lymph nodes. 2. Hypermetabolic hepatic metastasis. 3. Enlarged and diffusely hypermetabolic spleen. 4. No abdominal/pelvic lymphadenopathy. 5. No obvious primary tumor is identified. A liver biopsy may be the safest option. Electronically Signed   By: Marijo Sanes M.D.   On: 10/29/2018 20:41       Today   Subjective:   Carol Bryan patient currently comfortable Objective:   Blood pressure 101/66, pulse 91, temperature 97.6 F (36.4 C), temperature source Oral, resp. rate 16, height 5\' 5"  (1.651 m), weight 69.9 kg, SpO2 99 %.  .  Intake/Output Summary (Last 24 hours) at 11/10/2018 1130 Last data filed at 11/10/2018 0413 Gross per 24 hour  Intake -  Output 575 ml  Net -575 ml    Exam VITAL SIGNS: Blood pressure 101/66, pulse 91, temperature 97.6 F (36.4 C), temperature source Oral, resp. rate 16, height 5\' 5"  (1.651 m), weight 69.9 kg, SpO2 99 %.  GENERAL:  84  y.o.-year-old patient lying in the bed with no acute distress.  EYES: Pupils equal, round, reactive to light and  accommodation. No scleral icterus. Extraocular muscles intact.  HEENT: Head atraumatic, normocephalic. Oropharynx and nasopharynx clear.  NECK:  Supple, no jugular venous distention. No thyroid enlargement, no tenderness.  LUNGS: Normal breath sounds bilaterally, no wheezing, rales,rhonchi or crepitation. No use of accessory muscles of respiration.  CARDIOVASCULAR: S1, S2 normal. No murmurs, rubs, or gallops.  ABDOMEN: Soft, nontender, nondistended. Bowel sounds present. No organomegaly or mass.  EXTREMITIES: No pedal edema, cyanosis, or clubbing.  NEUROLOGIC: Cranial nerves II through XII are intact. Muscle strength 5/5 in all extremities. Sensation intact. Gait not checked.  PSYCHIATRIC: The patient is alert and oriented x 3.  SKIN: No obvious rash, lesion, or ulcer.   Data Review     CBC w Diff:  Lab Results  Component Value Date   WBC 3.1 (L) 11/10/2018   HGB 9.4 (L) 11/10/2018   HGB 12.5 07/27/2014   HCT 32.1 (L) 11/10/2018   HCT 38.0 07/27/2014   PLT 179 11/10/2018   PLT 194 07/27/2014   LYMPHOPCT 13 10/20/2018   MONOPCT 6 10/20/2018   MONOPCT 17 07/27/2014   EOSPCT 0 10/20/2018   BASOPCT 0 10/20/2018   CMP:  Lab Results  Component Value Date   NA 135 11/08/2018   NA 140 06/20/2013   K 4.3 11/08/2018   K 3.4 (L) 06/20/2013   CL 107 11/08/2018   CL 105 06/20/2013   CO2 17 (L) 11/08/2018   CO2 29 06/20/2013   BUN 23 11/08/2018   BUN 25 (H) 06/20/2013   CREATININE 0.94 11/08/2018   CREATININE 1.10 06/20/2013   PROT 6.3 (L) 11/06/2018   PROT 6.5 06/20/2013   ALBUMIN 2.4 (L) 11/06/2018   ALBUMIN 3.4 06/20/2013   BILITOT 0.7 11/06/2018   BILITOT 0.6 06/20/2013   ALKPHOS 149 (H) 11/06/2018   ALKPHOS 78 06/20/2013   AST 25 11/06/2018   AST 26 06/20/2013   ALT 14 11/06/2018   ALT 20 06/20/2013  .  Micro Results Recent Results (from the past 240  hour(s))  Urine culture     Status: Abnormal   Collection Time: 11/06/18  6:38 PM  Result Value Ref Range Status   Specimen Description   Final    URINE, RANDOM Performed at [facility], [address], [place], [place] [address]    Special Requests   Final    NONE Performed at [facility], [address] [place],  [address]    Culture >=100,000 COLONIES/mL ENTEROBACTER AEROGENES (A)  Final   Report Status 11/09/2018 FINAL  Final   Organism ID, Bacteria ENTEROBACTER AEROGENES (A)  Final      Susceptibility   Enterobacter aerogenes - MIC*    CEFAZOLIN >=64 RESISTANT Resistant     CEFTRIAXONE >=64 RESISTANT Resistant     CIPROFLOXACIN <=0.25 SENSITIVE Sensitive     GENTAMICIN <=1 SENSITIVE Sensitive     IMIPENEM 1 SENSITIVE Sensitive     NITROFURANTOIN 64 INTERMEDIATE Intermediate     TRIMETH/SULFA <=20 SENSITIVE Sensitive     PIP/TAZO >=128 RESISTANT Resistant     * >=100,000 COLONIES/mL ENTEROBACTER AEROGENES        Code Status Orders  (From admission, onward)         Start     Ordered   11/07/18 0945  Do not attempt resuscitation (DNR)  Continuous    Question Answer Comment  In the event of cardiac or respiratory ARREST Do not call a "code blue"   In the event of cardiac or respiratory ARREST  Do not perform Intubation, CPR, defibrillation or ACLS   In the event of cardiac or respiratory ARREST Use medication by any route, position, wound care, and other measures to relive pain and suffering. May use oxygen, suction and manual treatment of airway obstruction as needed for comfort.      11/07/18 0946        Code Status History    Date Active Date Inactive Code Status Order ID Comments User Context   09/30/2018 2213 10/03/2018 1750 Full Code [id number]  Amelia Jo, MD ED   06/08/2018 0431 06/10/2018 1523 Full Code [id number]  Harrie Foreman, MD Inpatient   10/29/2017 1616 10/30/2017 1653 Full Code [id number]  Loletha Grayer, MD ED    12/24/2016 2056 12/26/2016 1817 Full Code [id number]  Lance Coon, MD Inpatient    Advance Directive Documentation     Most Recent Value  Type of Advance Directive  Healthcare Power of Attorney, Living will  Pre-existing out of facility DNR order (yellow form or pink MOST form)  -  "MOST" Form in Place?  -          Follow-up Information    [name], MD Follow up in 1 week(s).   Specialty:  Internal Medicine Why:  hosp fu Contact information: [place] [address] [phone]           Discharge Medications   Allergies as of 11/10/2018      Reactions   Ciprofloxacin Other (See Comments)   Foot burning; tolerates levaquin   Methotrexate Other (See Comments)   leukopenia   Gold-containing Drug Products Rash   Other Other (See Comments), Rash   Sulfa Antibiotics Rash      Medication List    TAKE these medications   acetaminophen 500 MG tablet Commonly known as:  TYLENOL Take 500 mg by mouth daily as needed for mild pain or moderate pain.   amitriptyline 10 MG tablet Commonly known as:  ELAVIL Take 20 mg by mouth at bedtime.   apixaban 2.5 MG Tabs tablet Commonly known as:  ELIQUIS Take 2.5 mg by mouth 2 (two) times daily.   azelastine 0.1 % nasal spray Commonly known as:  ASTELIN Place 1 spray into both nostrils 2 (two) times daily. Use in each nostril as directed   cholecalciferol 25 MCG (1000 UT) tablet Commonly known as:  VITAMIN D3 Take 1,000 Units by mouth daily.   diclofenac sodium 1 % Gel Commonly known as:  VOLTAREN Apply 2 g topically 3 (three) times daily.   docusate sodium 100 MG capsule Commonly known as:  COLACE Take 1 capsule (100 mg total) by mouth 2 (two) times daily.   folic acid 1 MG tablet Commonly known as:  FOLVITE Take 3 mg by mouth daily.   furosemide 20 MG tablet Commonly known as:  LASIX Take 10 mg by mouth 2 (two) times daily.   HYDROcodone-acetaminophen 5-325 MG  tablet Commonly known as:  NORCO/VICODIN Take 1 tablet by mouth every 4 (four) hours as needed for moderate pain.   hydroxychloroquine 200 MG tablet Commonly known as:  PLAQUENIL Take 200 mg by mouth daily.   levofloxacin 250 MG tablet Commonly known as:  LEVAQUIN Take 1 tablet (250 mg total) by mouth daily for 4 days. Start taking on:  November 11, 2018   levothyroxine 50 MCG tablet Commonly known as:  SYNTHROID, LEVOTHROID Take 50 mcg by mouth daily before breakfast.   lidocaine 5 % Commonly  known as:  [place] 1 patch onto the skin daily. (remove after 12 hours or as directed   metoprolol succinate 12.5 mg Tb24 24 hr tablet Commonly known as:  TOPROL-XL Take 12.5 mg by mouth daily.   pantoprazole 40 MG tablet Commonly known as:  PROTONIX Take 1 tablet (40 mg total) by mouth daily.   polyethylene glycol packet Commonly known as:  MIRALAX / GLYCOLAX Take 17 g by mouth daily.   polyvinyl alcohol 1.4 % ophthalmic solution Commonly known as:  LIQUIFILM TEARS Place 1 drop into both eyes as needed for dry eyes.   potassium chloride SA 20 MEQ tablet Commonly known as:  K-DUR,KLOR-CON Take 20 mEq by mouth daily.   predniSONE 5 MG tablet Commonly known as:  DELTASONE Take 5 mg by mouth 2 (two) times daily with a meal.   prochlorperazine 10 MG tablet Commonly known as:  COMPAZINE Take 1 tablet (10 mg total) by mouth every 6 (six) hours as needed for nausea or vomiting.   tiZANidine 2 MG tablet Commonly known as:  ZANAFLEX Take 2 mg by mouth 2 (two) times daily as needed for muscle spasms.          Total Time in preparing paper work, data evaluation and todays exam - 32 minutes  Dustin Flock M.D on 11/10/2018 at 11:30 Rew  [phone]

## 2018-11-10 NOTE — Clinical Social Work Note (Signed)
CSW spoke with [facility] again today and they again denied patient. CSW notified patient's daughter Steffanie Rainwater [phone] and explained that [facility] will not be able to take patient. Daughter states that she needs to discuss with her sisters before making a decision. CSW explained that insurance authorization is needed and that patient is ready for discharge so we need a decision as soon as possible. Daughter states that she will get back to CSW when they make a decision.   [place], [place]

## 2018-11-10 NOTE — Progress Notes (Signed)
[place] [facility]                                                                                                                                                                                  Patient Demographics   Carol Bryan, is a 84 y.o. female, DOB - Dec 14, 1922, OVZ:[id number]  Admit date - 11/06/2018   Admitting Physician Dustin Flock, MD  Outpatient Primary MD for the patient is [name], MD   LOS - 3  Subjective: Patient had 2 bowel movements denies any other complaints   Review of Systems:   CONSTITUTIONAL: No documented fever. No fatigue, weakness. No weight gain, no weight loss.  EYES: No blurry or double vision.  ENT: No tinnitus. No postnasal drip. No redness of the oropharynx.  RESPIRATORY: No cough, no wheeze, no hemoptysis. No dyspnea.  CARDIOVASCULAR: No chest pain. No orthopnea. No palpitations. No syncope.  GASTROINTESTINAL: No nausea, no vomiting or diarrhea.  Positive abdominal pain. No melena or hematochezia.  GENITOURINARY: No dysuria or hematuria.  ENDOCRINE: No polyuria or nocturia. No heat or cold intolerance.  HEMATOLOGY: No anemia. No bruising. No bleeding.  INTEGUMENTARY: No rashes. No lesions.  MUSCULOSKELETAL: No arthritis. No swelling. No gout.  NEUROLOGIC: No numbness, tingling, or ataxia. No seizure-type activity.  PSYCHIATRIC: No anxiety. No insomnia. No ADD.    Vitals:   Vitals:   11/09/18 2005 11/10/18 0406 11/10/18 0933 11/10/18 1300  BP: 103/63 121/73 101/66 123/65  Pulse: 88 77 91 72  Resp: 18 18 16 20   Temp:   97.6 F (36.4 C) (!) 97.5 F (36.4 C)  TempSrc:   Oral Oral  SpO2: 97% 96% 99% 99%  Weight:      Height:        Wt Readings from Last 3 Encounters:  11/06/18 69.9 kg  10/20/18 71.4 kg  10/03/18 70 kg     Intake/Output Summary (Last 24 hours) at 11/10/2018 1451 Last data filed at 11/10/2018 1419 Gross per 24 hour  Intake 240 ml  Output 575 ml  Net -335 ml     Physical Exam:   GENERAL: Chronically ill appearing HEAD, EYES, EARS, NOSE AND THROAT: Atraumatic, normocephalic. Extraocular muscles are intact. Pupils equal and reactive to light. Sclerae anicteric. No conjunctival injection. No oro-pharyngeal erythema.  NECK: Supple. There is no jugular venous distention. No bruits, no lymphadenopathy, no thyromegaly.  HEART: Regular rate and rhythm,. No murmurs, no rubs, no clicks.  LUNGS: Clear to auscultation bilaterally. No rales or rhonchi. No wheezes.  ABDOMEN: Soft, flat, nontender, nondistended. Has good bowel sounds. No hepatosplenomegaly appreciated.  EXTREMITIES: No evidence of any cyanosis, clubbing, or  1+ peripheral edema.  +2 pedal and radial pulses bilaterally.  NEUROLOGIC: The patient is alert, awake, and oriented x3 with no focal motor or sensory deficits appreciated bilaterally.  SKIN: Moist and warm with no rashes appreciated.  Psych: Not anxious, depressed LN: No inguinal LN enlargement    Antibiotics   Anti-infectives (From admission, onward)   Start     Dose/Rate Route Frequency Ordered Stop   11/11/18 0000  levofloxacin (LEVAQUIN) 250 MG tablet     250 mg Oral Daily 11/10/18 1122 11/23/18 2359   11/09/18 1600  levofloxacin (LEVAQUIN) tablet 250 mg     250 mg Oral Daily 11/09/18 1509     11/07/18 1000  levofloxacin (LEVAQUIN) tablet 500 mg  Status:  Discontinued     500 mg Oral Daily 11/06/18 2211 11/07/18 1432   11/07/18 1000  hydroxychloroquine (PLAQUENIL) tablet 200 mg     200 mg Oral Daily 11/06/18 2212     11/06/18 1945  cefTRIAXone (ROCEPHIN) 1 g in sodium chloride 0.9 % 100 mL IVPB     1 g 200 mL/hr over 30 Minutes Intravenous  Once 11/06/18 1937 11/06/18 2326      Medications   Scheduled Meds: . amitriptyline  20 mg Oral QHS  . apixaban  2.5 mg Oral BID  . cholecalciferol  1,000 Units Oral Daily  . folic acid  3 mg Oral Daily  . furosemide  20 mg Oral BID  . hydroxychloroquine  200 mg Oral Daily  .  levofloxacin  250 mg Oral Daily  . levothyroxine  50 mcg Oral QAC breakfast  . metoprolol succinate  12.5 mg Oral Daily  . pantoprazole  40 mg Oral Daily  . polyethylene glycol  17 g Oral Daily  . potassium chloride SA  20 mEq Oral Daily  . predniSONE  5 mg Oral BID WC   Continuous Infusions:  PRN Meds:.acetaminophen **OR** acetaminophen, azelastine, bisacodyl, HYDROcodone-acetaminophen, HYDROmorphone (DILAUDID) injection, ondansetron **OR** ondansetron (ZOFRAN) IV, polyvinyl alcohol, prochlorperazine   Data Review:   Micro Results Recent Results (from the past 240 hour(s))  Urine culture     Status: Abnormal   Collection Time: 11/06/18  6:38 PM  Result Value Ref Range Status   Specimen Description   Final    URINE, RANDOM Performed at [facility], [address] [place], [place] [address]    Special Requests   Final    NONE Performed at [facility], [address] [place], [place] [address]    Culture >=100,000 COLONIES/mL ENTEROBACTER AEROGENES (A)  Final   Report Status 11/09/2018 FINAL  Final   Organism ID, Bacteria ENTEROBACTER AEROGENES (A)  Final      Susceptibility   Enterobacter aerogenes - MIC*    CEFAZOLIN >=64 RESISTANT Resistant     CEFTRIAXONE >=64 RESISTANT Resistant     CIPROFLOXACIN <=0.25 SENSITIVE Sensitive     GENTAMICIN <=1 SENSITIVE Sensitive     IMIPENEM 1 SENSITIVE Sensitive     NITROFURANTOIN 64 INTERMEDIATE Intermediate     TRIMETH/SULFA <=20 SENSITIVE Sensitive     PIP/TAZO >=128 RESISTANT Resistant     * >=100,000 COLONIES/mL ENTEROBACTER AEROGENES    Radiology Reports Dg Chest 2 View  Result Date: 11/06/2018 CLINICAL DATA:  Increasing weakness. Liver lung metastases of unknown primary. EXAM: CHEST - 2 VIEW COMPARISON:  Thoracic spine x-ray November 04, 2018 FINDINGS: A nodule or mass in the right upper lobe is again identified. A torturous thoracic aorta is stable. The heart, hila, and  mediastinum are unchanged. No  pneumothorax. No focal infiltrate. Minimal atelectasis in the left base. The suspected compression fracture of a lower thoracic vertebral bodies seen on the November 04, 2018 thoracic spine x-ray is unchanged. IMPRESSION: 1. Again noted is a lower thoracic vertebral body compression fracture, thought to be new on the November 04, 2018 comparison. 2. Persistent nodule/mass in the right upper lung. Electronically Signed   By: Dorise Bullion III M.D   On: 11/06/2018 17:24   Dg Thoracic Spine 2 View  Result Date: 11/04/2018 CLINICAL DATA:  Fall with left-sided back pain EXAM: THORACIC SPINE 2 VIEWS; LUMBAR SPINE - COMPLETE 4+ VIEW COMPARISON:  Chest radiograph 09/30/2018 FINDINGS: THORACIC SPINE: There is moderate height loss of T10 that appears new compared to the chest radiograph 09/30/2018. No other focal abnormality is visualized within the thoracic spine. Alignment of the thoracic spine is normal. LUMBAR SPINE: There is unchanged grade 1 anterolisthesis at L3-L4. No lumbar spine fracture. IMPRESSION: Mild-to-moderate height loss of T10, likely new compared to chest radiograph 09/30/2018 and concerning for fracture. MRI may be helpful for further assessment. These results will be called to the ordering clinician or representative by the Radiologist Assistant, and communication documented in the PACS or zVision Dashboard. Electronically Signed   By: Ulyses Jarred M.D.   On: 11/04/2018 16:50   Dg Lumbar Spine Complete  Result Date: 11/04/2018 CLINICAL DATA:  Fall with left-sided back pain EXAM: THORACIC SPINE 2 VIEWS; LUMBAR SPINE - COMPLETE 4+ VIEW COMPARISON:  Chest radiograph 09/30/2018 FINDINGS: THORACIC SPINE: There is moderate height loss of T10 that appears new compared to the chest radiograph 09/30/2018. No other focal abnormality is visualized within the thoracic spine. Alignment of the thoracic spine is normal. LUMBAR SPINE: There is unchanged grade 1 anterolisthesis at L3-L4. No lumbar spine  fracture. IMPRESSION: Mild-to-moderate height loss of T10, likely new compared to chest radiograph 09/30/2018 and concerning for fracture. MRI may be helpful for further assessment. These results will be called to the ordering clinician or representative by the Radiologist Assistant, and communication documented in the PACS or zVision Dashboard. Electronically Signed   By: Ulyses Jarred M.D.   On: 11/04/2018 16:50   Ct Head Wo Contrast  Result Date: 11/06/2018 CLINICAL DATA:  Increasing weakness and lethargy today. History of atrial fibrillation and lung cancer. EXAM: CT HEAD WITHOUT CONTRAST TECHNIQUE: Contiguous axial images were obtained from the base of the skull through the vertex without intravenous contrast. COMPARISON:  CT head 05/12/2017.  PET-CT 10/29/2018. FINDINGS: Brain: There is no evidence of acute intracranial hemorrhage, mass lesion, brain edema or extra-axial fluid collection. There is atrophy with diffuse prominence of the ventricles and subarachnoid spaces. There is chronic low-density in the periventricular white matter and chronic symmetric calcifications of the basal ganglia. There is no CT evidence of acute cortical infarction. Vascular: Extensive intracranial vascular calcifications. No hyperdense vessel identified. Skull: Negative for fracture or focal lesion. Sinuses/Orbits: The visualized paranasal sinuses and mastoid air cells are clear. No orbital abnormalities are seen. Other: None. IMPRESSION: Stable head CT without acute intracranial findings. Stable atrophy and chronic small vessel ischemic changes. Electronically Signed   By: Richardean Sale M.D.   On: 11/06/2018 17:33   Ct Chest Wo Contrast  Result Date: 11/07/2018 CLINICAL DATA:  Increased shortness of breath and weakness starting yesterday. Recent diagnosis of metastatic cancer of uncertain primary. EXAM: CT CHEST WITHOUT CONTRAST TECHNIQUE: Multidetector CT imaging of the chest was performed following the standard  protocol without  IV contrast. COMPARISON:  PET-CT October 29, 2018 FINDINGS: Cardiovascular: Atherosclerotic changes seen in the nonaneurysmal aorta. The central pulmonary arteries are normal in caliber. Coronary artery calcifications are identified. The heart is unchanged. Mediastinum/Nodes: The thyroid and esophagus are unremarkable. There is a small left pleural effusion. No other effusions. No adenopathy. Lungs/Pleura: Central airways are normal. No pneumothorax. The nodule in the right upper lobe on series 3, image 42 measures 3.1 cm today versus 2.4 cm on the comparison PET-CT. The nodule in the superior segment of the left lower lobe measures 2.1 cm today and on the previous study. This nodule is seen on series 3, image 61 today. The nodule described in the left lower lobe on the previous study is difficult to measure today due to adjacent infiltrate. The nodule described in the deep right posterior lung base on the previous study is also difficult to measure due to adjacent atelectasis but appears larger in the mid interval measuring 2 cm today versus 1.7 cm previously. Numerous other pulmonary nodules are stable to larger in the interval. There is atelectasis in the right middle lobe. There is atelectasis posteriorly in the right base. Opacity in the left base with an associated small effusion could represent atelectasis or infiltrate/pneumonia. Upper Abdomen: Hepatic steatosis. Known liver lesions are not well assessed on this unenhanced study. No splenic lesions are also not as well seen without contrast. No other changes in the upper abdomen. Musculoskeletal: 20% loss of anterior height of T10 is stable since November 04, 2018 x-rays. IMPRESSION: 1. Bilateral pulmonary nodules, thought to represent metastatic disease on the recent PET-CT, have overall worsened in the interval. 2. There is a small left effusion with underlying opacity. The underlying opacity could represent atelectasis or pneumonia.  Recommend clinical correlation and follow-up to resolution. 3. Atherosclerotic change in the nonaneurysmal aorta. Coronary artery calcifications. 4. Known hepatic and splenic lesions are not well seen on today's study due to lack of contrast. 5. Stable mild compression fracture of T10. Aortic Atherosclerosis (ICD10-I70.0). Electronically Signed   By: Dorise Bullion III M.D   On: 11/07/2018 10:43   Ct Abdomen Pelvis W Contrast  Result Date: 11/07/2018 CLINICAL DATA:  Constipation. Nausea and vomiting. Acute abdominal pain. Patient's family reports weakness and lethargy. EXAM: CT ABDOMEN AND PELVIS WITH CONTRAST TECHNIQUE: Multidetector CT imaging of the abdomen and pelvis was performed using the standard protocol following bolus administration of intravenous contrast. CONTRAST:  94mL OMNIPAQUE IOHEXOL 300 MG/ML  SOLN COMPARISON:  PET CT 10/29/2018, abdominal CT 09/30/2018 FINDINGS: Lower chest: Basilar pulmonary nodules which are recently characterized on PET. Small left pleural effusion which is new. There is adjacent basilar atelectasis. Hepatobiliary: Multiple ill-defined hypodense liver lesions, difficult to define but grossly similar to prior exam. Gallbladder physiologically distended, no calcified stone. No biliary dilatation. Small volume of perihepatic ascites. Pancreas: No ductal dilatation or inflammation. Spleen: Splenomegaly as seen on prior exams, the spleen spans 16.2 cm cranial caudal. Multiple splenic infarcts which have progressed from prior contrast-enhanced abdominal CT. Infarct in the anterior upper spleen has adjacent stranding. Small amount of perisplenic fluid. Multiple additional low-density splenic lesions. Adrenals/Urinary Tract: No adrenal nodule. No hydronephrosis or perinephric edema. Left renal cysts appear similar to prior exam. Urinary bladder is distended. Pelvic floor laxity with cystocele again seen, less pronounced than on prior exam. Stomach/Bowel: Again seen large proximal  duodenal diverticulum and small bowel malrotation with duodenum not crossing the midline. Contrast in fluid-filled small bowel that is not dilated or obstructed.  Appendix not visualized, prior appendectomy per history. Colonic diverticulosis with resolution of sigmoid diverticulitis from prior exam. Cecum slightly high-riding in the right mid abdomen. Moderate to large volume of colonic stool. Pelvic floor laxity with rectocele, suture line at the and rectal junction. Vascular/Lymphatic: Aortic atherosclerosis and tortuosity. 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 Splenic vein is tortuous and prominent but no evidence of splenic vein thrombosis. 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 This is unchanged from prior exam. No adenopathy. Reproductive: Status post hysterectomy. No adnexal masses. Other: Small amount of perihepatic and perisplenic free fluid. No free air. No intra-abdominal abscess. No abdominal wall hernia. Musculoskeletal: T10 compression fracture is new from prior CT but unchanged from lumbar spine radiograph 11/04/2018. Degenerative change throughout spine. No focal osseous lesion. IMPRESSION: 1. Moderate colonic stool burden suggesting constipation. No bowel obstruction. 2. Splenomegaly with multiple splenic infarcts which have progressed from prior contrast-enhanced abdominal CT. 3. Hepatic metastatic disease, better characterized on recent PET. Lung base pulmonary nodules also better assessed on PET. 4. Small amount of perihepatic and perisplenic ascites. Small left pleural effusion is new from prior exam. 5. Distended urinary bladder, cystocele again seen but less prominent than on prior. 6. Colonic diverticulosis with resolution of sigmoid diverticulitis from prior exam. 7. Chronic findings include small splenic artery aneurysm, duodenal diverticulum with  partial bowel malrotation, pelvic floor laxity, and Aortic atherosclerosis (ICD10-I70.0). 8. Mild to moderate T10 compression fracture, unchanged from recent radiographs but new from prior CT. Electronically Signed   By: Keith Rake M.D.   On: 11/07/2018 04:38   Nm Pet Image Initial (pi) Skull Base To Thigh  Result Date: 10/29/2018 CLINICAL DATA:  Initial treatment strategy for pulmonary nodules. EXAM: NUCLEAR MEDICINE PET SKULL BASE TO THIGH TECHNIQUE: 8.79 mCi F-18 FDG was injected intravenously. Full-ring PET imaging was performed from the skull base to thigh after the radiotracer. CT data was obtained and used for attenuation correction and anatomic localization. Fasting blood glucose: 92 mg/dl COMPARISON:  CT scan 09/30/2018 FINDINGS: Mediastinal blood pool activity: SUV max 2.67 NECK: No hypermetabolic lymph nodes in the neck. Incidental CT findings: none CHEST: 23.5 mm right upper lobe pulmonary nodule on image number 70 is hypermetabolic with SUV max of 9.56. 20 mm superior segment left lower lobe pulmonary nodule on image number 80 is hypermetabolic with SUV max of 2.13. 17 mm left lower lobe nodule on image number 086 is hypermetabolic with SUV max of 5.78. 17 mm right basilar nodule on image number 469 is hypermetabolic with SUV max of 6.29. Other small scattered pulmonary nodules are again noted. No enlarged or hypermetabolic mediastinal or hilar lymph nodes. No breast masses, supraclavicular or axillary adenopathy. Incidental CT findings: Stable emphysematous changes and pulmonary scarring. Stable advanced vascular calcifications with three-vessel coronary artery calcifications. ABDOMEN/PELVIS: The segment 5 liver lesion is hypermetabolic and appears larger but difficult to measure without contrast SUV max is 9.06. Smaller lesion in segment 6 is hypermetabolic with SUV max of 5.28. The spleen is diffusely hypermetabolic with multiple splenic lesions as demonstrated on the previous CT scan. No  adrenal gland lesions.  No abdominal/pelvic lymphadenopathy. Incidental CT findings: Severe sigmoid colon diverticulosis without findings for acute diverticulitis. SKELETON: No findings for osseous metastatic disease. Incidental CT findings: none IMPRESSION: 1. Multiple hypermetabolic pulmonary nodules most consistent with metastatic disease. No enlarged or hypermetabolic mediastinal or  hilar lymph nodes. 2. Hypermetabolic hepatic metastasis. 3. Enlarged and diffusely hypermetabolic spleen. 4. No abdominal/pelvic lymphadenopathy. 5. No obvious primary tumor is identified. A liver biopsy may be the safest option. Electronically Signed   By: Marijo Sanes M.D.   On: 10/29/2018 20:41     CBC Recent Labs  Lab 11/06/18 1653 11/07/18 1244 11/08/18 0431 11/09/18 0431 11/10/18 0600  WBC 3.8* 3.7* 3.4* 4.5 3.1*  HGB 11.1* 10.5* 8.7* 9.1* 9.4*  HCT 37.2 35.1* 30.2* 30.2* 32.1*  PLT 198 179 159 172 179  MCV 92.5 92.6 96.5 92.9 92.5  MCH 27.6 27.7 27.8 28.0 27.1  MCHC 29.8* 29.9* 28.8* 30.1 29.3*  RDW 16.9* 17.2* 17.2* 17.2* 17.4*    Chemistries  Recent Labs  Lab 11/06/18 1653 11/06/18 2124 11/06/18 2130 11/08/18 1341  NA 132* 134*  --  135  K 4.4 4.0  --  4.3  CL 98 103  --  107  CO2 23 23  --  17*  GLUCOSE 121* 99  --  73  BUN 29* 24*  --  23  CREATININE 1.41* 1.15*  --  0.94  CALCIUM 8.3* 7.4*  --  7.1*  AST  --   --  25  --   ALT  --   --  14  --   ALKPHOS  --   --  149*  --   BILITOT  --   --  0.7  --    ------------------------------------------------------------------------------------------------------------------ estimated creatinine clearance is 36.7 mL/min (by C-G formula based on SCr of 0.94 mg/dL). ------------------------------------------------------------------------------------------------------------------ No results for input(s): HGBA1C in the last 72  hours. ------------------------------------------------------------------------------------------------------------------ No results for input(s): CHOL, HDL, LDLCALC, TRIG, CHOLHDL, LDLDIRECT in the last 72 hours. ------------------------------------------------------------------------------------------------------------------ No results for input(s): TSH, T4TOTAL, T3FREE, THYROIDAB in the last 72 hours.  Invalid input(s): FREET3 ------------------------------------------------------------------------------------------------------------------ No results for input(s): VITAMINB12, FOLATE, FERRITIN, TIBC, IRON, RETICCTPCT in the last 72 hours.  Coagulation profile Recent Labs  Lab 11/07/18 1244  INR 1.43    No results for input(s): DDIMER in the last 72 hours.  Cardiac Enzymes Recent Labs  Lab 11/06/18 1653 11/06/18 2124  TROPONINI 0.05* 0.06*   ------------------------------------------------------------------------------------------------------------------ Invalid input(s): POCBNP    Assessment & Plan   IMPRESSION AND PLAN: Patient is a 84 year old with cancer with unknown primary with mets  1.  Acute hypoxic respiratory failure  This is related to progression of her metastatic cancer to her lungs based on CT scan  Continue oxygen therapy Continue oral Lasix  2.  Abdominal pain suspect due to combination of constipation and splenic infarct Now resolved with mag citrate On discharge patient will be on MiraLAX and Colace  3.  Metastatic cancer supportive care prognosis poor appreciate palliative care input  4.    Urinary retention discharged on Foley with voiding trials at the facility  5.  History of rheumatoid arthritis continue prednisone twice daily  6.    History of atrial flutter and history of DVT discontinue heparin resume Eliquis  7.  Disposition plan awaiting insurance authorization to patient to go to rehab with palliative care team to follow      Code Status Orders  (From admission, onward)         Start     Ordered   11/07/18 0945  Do not attempt resuscitation (DNR)  Continuous    Question Answer Comment  In the event of cardiac or respiratory ARREST Do not call a "code blue"   In the event of cardiac  or respiratory ARREST Do not perform Intubation, CPR, defibrillation or ACLS   In the event of cardiac or respiratory ARREST Use medication by any route, position, wound care, and other measures to relive pain and suffering. May use oxygen, suction and manual treatment of airway obstruction as needed for comfort.      11/07/18 0946        Code Status History    Date Active Date Inactive Code Status Order ID Comments User Context   09/30/2018 2213 10/03/2018 1750 Full Code [id number]  Amelia Jo, MD ED   06/08/2018 0431 06/10/2018 1523 Full Code [id number]  Harrie Foreman, MD Inpatient   10/29/2017 1616 10/30/2017 1653 Full Code [id number]  Loletha Grayer, MD ED   12/24/2016 2056 12/26/2016 1817 Full Code [id number]  Lance Coon, MD Inpatient    Advance Directive Documentation     Most Recent Value  Type of Advance Directive  Healthcare Power of [place], Living will  Pre-existing out of facility DNR order (yellow form or pink MOST form)  -  "MOST" Form in Place?  -           Consults none  DVT Prophylaxis Heparin Lab Results  Component Value Date   PLT 179 11/10/2018     Time Spent in minutes 27 minutes greater than 50% of time spent in care coordination and counseling patient regarding the condition and plan of care.   Dustin Flock M.D on 11/10/2018 at 2:51 PM  Between 7am to 6pm - Pager - [phone]  After 6pm go to [email or web address] - Proofreader  [employer]   Office  [phone]

## 2018-11-10 NOTE — Consult Note (Signed)
[place]  Telephone:([phone] [phone]   Name: Carol Bryan Date: 11/10/2018 MRN: [id number]  DOB: [birth year]  Patient Care Team: Adin Hector, MD as PCP - General (Internal Medicine) Corey Skains, MD as Consulting Physician (Cardiology)    REASON FOR CONSULTATION: Palliative Care consult requested for this 84 y.o. female with multiple medical problems including a flutter status post cardioversion, RA, history of DVT/PE, Sjogren's disease, hypothyroidism.  Patient was recently hospitalized 09/30/2018 to 10/03/2018 with acute diverticulitis and was incidentally found to have multiple pulmonary, liver, and spleen nodules, all of which appeared hypermetabolic on subsequent PET scan.  Patient was referred to oncology and wanted to pursue liver biopsy. However, she was thought likely to be a poor candidate for treatment.  Patient was admitted to the hospital on 11/06/2018 with weakness and respiratory failure.  Repeat CT of the chest revealed disease progression with worsened bilateral pulmonary nodules when compared to recent PET scan.  Palliative care was consulted to help address goals.  SOCIAL HISTORY:    Patient is widowed.  Her husband died in 11-16-17.  She lives at home with her daughters rotating care so that she is not alone.  She has 3 daughters, all of whom are involved in her care.  Patient retired from [employer].  ADVANCE DIRECTIVES:  Patient's daughter is her healthcare power of attorney.  She has a living will.  CODE STATUS: DNR  PAST MEDICAL HISTORY: Past Medical History:  Diagnosis Date  . Atrial flutter ([place])    cardioverted  . Cancer [facility])    recent dx'd with Lung and Liver cancer per daughter.  . Collagen vascular disease ([place])    RA  . DVT (deep venous thrombosis) ([place])   . HLD (hyperlipidemia)   . Hypertension   . Hypothyroid   . Pulmonary embolism ([place])   . RA (rheumatoid  arthritis) ([place])   . Sjogren's disease ([place])     PAST SURGICAL HISTORY:  Past Surgical History:  Procedure Laterality Date  . ABDOMINAL HYSTERECTOMY    . APPENDECTOMY    . ASD REPAIR  age 80  . cataracts    . JOINT REPLACEMENT Left    elbow  . PATENT DUCTUS ARTERIOUS REPAIR      HEMATOLOGY/ONCOLOGY HISTORY:   No history exists.    ALLERGIES:  is allergic to ciprofloxacin; methotrexate; gold-containing drug products; other; and sulfa antibiotics.  MEDICATIONS:  Current Facility-Administered Medications  Medication Dose Route Frequency Provider Last Rate Last Dose  . acetaminophen (TYLENOL) tablet 650 mg  650 mg Oral Q6H PRN Dustin Flock, MD       Or  . acetaminophen (TYLENOL) suppository 650 mg  650 mg Rectal Q6H PRN Dustin Flock, MD      . amitriptyline (ELAVIL) tablet 20 mg  20 mg Oral QHS Eula Listen, MD   20 mg at 11/09/18 2300  . apixaban (ELIQUIS) tablet 2.5 mg  2.5 mg Oral BID Dustin Flock, MD   2.5 mg at 11/10/18 3810  . azelastine (ASTELIN) 0.1 % nasal spray 1 spray  1 spray Each Nare BID PRN Dustin Flock, MD      . bisacodyl (DULCOLAX) suppository 10 mg  10 mg Rectal Daily PRN Lance Coon, MD   10 mg at 11/10/18 0414  . cholecalciferol (VITAMIN D3) tablet 1,000 Units  1,000 Units Oral Daily Dustin Flock, MD   1,000 Units at 11/10/18 [phone]  . folic acid (FOLVITE) tablet  3 mg  3 mg Oral Daily Eula Listen, MD   3 mg at 11/10/18 5397  . furosemide (LASIX) tablet 20 mg  20 mg Oral BID Dustin Flock, MD   20 mg at 11/10/18 6734  . HYDROcodone-acetaminophen (NORCO/VICODIN) 5-325 MG per tablet 1-2 tablet  1-2 tablet Oral Q4H PRN Dustin Flock, MD   1 tablet at 11/10/18 [phone]  . HYDROmorphone (DILAUDID) injection 1 mg  1 mg Intravenous Q3H PRN Dustin Flock, MD      . hydroxychloroquine (PLAQUENIL) tablet 200 mg  200 mg Oral Daily Eula Listen, MD   200 mg at 11/10/18 0937  . levofloxacin (LEVAQUIN) tablet 250 mg  250 mg Oral  Daily Dustin Flock, MD   250 mg at 11/10/18 9024  . levothyroxine (SYNTHROID, LEVOTHROID) tablet 50 mcg  50 mcg Oral QAC breakfast Eula Listen, MD   50 mcg at 11/10/18 0973  . metoprolol succinate (TOPROL-XL) 24 hr tablet 12.5 mg  12.5 mg Oral Daily Eula Listen, MD   12.5 mg at 11/10/18 0937  . ondansetron (ZOFRAN) tablet 4 mg  4 mg Oral Q6H PRN Dustin Flock, MD       Or  . ondansetron (ZOFRAN) injection 4 mg  4 mg Intravenous Q6H PRN Dustin Flock, MD      . pantoprazole (PROTONIX) EC tablet 40 mg  40 mg Oral Daily Eula Listen, MD   40 mg at 11/10/18 0937  . polyethylene glycol (MIRALAX / GLYCOLAX) packet 17 g  17 g Oral Daily Dustin Flock, MD      . polyvinyl alcohol (LIQUIFILM TEARS) 1.4 % ophthalmic solution 1 drop  1 drop Both Eyes PRN Dustin Flock, MD      . potassium chloride SA (K-DUR,KLOR-CON) CR tablet 20 mEq  20 mEq Oral Daily Eula Listen, MD   20 mEq at 11/10/18 0938  . predniSONE (DELTASONE) tablet 5 mg  5 mg Oral BID WC Dustin Flock, MD   5 mg at 11/10/18 5329  . prochlorperazine (COMPAZINE) tablet 10 mg  10 mg Oral Q6H PRN Dustin Flock, MD        VITAL SIGNS: BP 101/66 (BP Location: Right Arm)   Pulse 91   Temp 97.6 F (36.4 C) (Oral)   Resp 16   Ht '5\' 5"'  (1.651 m)   Wt 154 lb (69.9 kg)   SpO2 99%   BMI 25.63 kg/m  Filed Weights   11/06/18 1649  Weight: 154 lb (69.9 kg)    Estimated body mass index is 25.63 kg/m as calculated from the following:   Height as of this encounter: '5\' 5"'  (1.651 m).   Weight as of this encounter: 154 lb (69.9 kg).  LABS: CBC:    Component Value Date/Time   WBC 3.1 (L) 11/10/2018 0600   HGB 9.4 (L) 11/10/2018 0600   HGB 12.5 07/27/2014 1505   HCT 32.1 (L) 11/10/2018 0600   HCT 38.0 07/27/2014 1505   PLT 179 11/10/2018 0600   PLT 194 07/27/2014 1505   MCV 92.5 11/10/2018 0600   MCV 92 07/27/2014 1505   NEUTROABS 2.4 10/20/2018 1119   LYMPHSABS 0.4 (L) 10/20/2018 1119    MONOABS 0.2 10/20/2018 1119   EOSABS 0.0 10/20/2018 1119   BASOSABS 0.0 10/20/2018 1119   Comprehensive Metabolic Panel:    Component Value Date/Time   NA 135 11/08/2018 1341   NA 140 06/20/2013 0605   K 4.3 11/08/2018 1341   K 3.4 (L) 06/20/2013 0605   CL 107 11/08/2018 1341  CL 105 06/20/2013 0605   CO2 17 (L) 11/08/2018 1341   CO2 29 06/20/2013 0605   BUN 23 11/08/2018 1341   BUN 25 (H) 06/20/2013 0605   CREATININE 0.94 11/08/2018 1341   CREATININE 1.10 06/20/2013 0605   GLUCOSE 73 11/08/2018 1341   GLUCOSE 114 (H) 06/20/2013 0605   CALCIUM 7.1 (L) 11/08/2018 1341   CALCIUM 8.6 06/20/2013 0605   AST 25 11/06/2018 2130   AST 26 06/20/2013 0605   ALT 14 11/06/2018 2130   ALT 20 06/20/2013 0605   ALKPHOS 149 (H) 11/06/2018 2130   ALKPHOS 78 06/20/2013 0605   BILITOT 0.7 11/06/2018 2130   BILITOT 0.6 06/20/2013 0605   PROT 6.3 (L) 11/06/2018 2130   PROT 6.5 06/20/2013 0605   ALBUMIN 2.4 (L) 11/06/2018 2130   ALBUMIN 3.4 06/20/2013 0605    RADIOGRAPHIC STUDIES: Dg Chest 2 View  Result Date: 11/06/2018 CLINICAL DATA:  Increasing weakness. Liver lung metastases of unknown primary. EXAM: CHEST - 2 VIEW COMPARISON:  Thoracic spine x-ray November 04, 2018 FINDINGS: A nodule or mass in the right upper lobe is again identified. A torturous thoracic aorta is stable. The heart, hila, and mediastinum are unchanged. No pneumothorax. No focal infiltrate. Minimal atelectasis in the left base. The suspected compression fracture of a lower thoracic vertebral bodies seen on the November 04, 2018 thoracic spine x-ray is unchanged. IMPRESSION: 1. Again noted is a lower thoracic vertebral body compression fracture, thought to be new on the November 04, 2018 comparison. 2. Persistent nodule/mass in the right upper lung. Electronically Signed   By: Dorise Bullion III M.D   On: 11/06/2018 17:24   Dg Thoracic Spine 2 View  Result Date: 11/04/2018 CLINICAL DATA:  Fall with left-sided back pain  EXAM: THORACIC SPINE 2 VIEWS; LUMBAR SPINE - COMPLETE 4+ VIEW COMPARISON:  Chest radiograph 09/30/2018 FINDINGS: THORACIC SPINE: There is moderate height loss of T10 that appears new compared to the chest radiograph 09/30/2018. No other focal abnormality is visualized within the thoracic spine. Alignment of the thoracic spine is normal. LUMBAR SPINE: There is unchanged grade 1 anterolisthesis at L3-L4. No lumbar spine fracture. IMPRESSION: Mild-to-moderate height loss of T10, likely new compared to chest radiograph 09/30/2018 and concerning for fracture. MRI may be helpful for further assessment. These results will be called to the ordering clinician or representative by the Radiologist Assistant, and communication documented in the PACS or zVision Dashboard. Electronically Signed   By: Ulyses Jarred M.D.   On: 11/04/2018 16:50   Dg Lumbar Spine Complete  Result Date: 11/04/2018 CLINICAL DATA:  Fall with left-sided back pain EXAM: THORACIC SPINE 2 VIEWS; LUMBAR SPINE - COMPLETE 4+ VIEW COMPARISON:  Chest radiograph 09/30/2018 FINDINGS: THORACIC SPINE: There is moderate height loss of T10 that appears new compared to the chest radiograph 09/30/2018. No other focal abnormality is visualized within the thoracic spine. Alignment of the thoracic spine is normal. LUMBAR SPINE: There is unchanged grade 1 anterolisthesis at L3-L4. No lumbar spine fracture. IMPRESSION: Mild-to-moderate height loss of T10, likely new compared to chest radiograph 09/30/2018 and concerning for fracture. MRI may be helpful for further assessment. These results will be called to the ordering clinician or representative by the Radiologist Assistant, and communication documented in the PACS or zVision Dashboard. Electronically Signed   By: Ulyses Jarred M.D.   On: 11/04/2018 16:50   Ct Head Wo Contrast  Result Date: 11/06/2018 CLINICAL DATA:  Increasing weakness and lethargy today. History of atrial fibrillation and  lung cancer. EXAM: CT  HEAD WITHOUT CONTRAST TECHNIQUE: Contiguous axial images were obtained from the base of the skull through the vertex without intravenous contrast. COMPARISON:  CT head 05/12/2017.  PET-CT 10/29/2018. FINDINGS: Brain: There is no evidence of acute intracranial hemorrhage, mass lesion, brain edema or extra-axial fluid collection. There is atrophy with diffuse prominence of the ventricles and subarachnoid spaces. There is chronic low-density in the periventricular white matter and chronic symmetric calcifications of the basal ganglia. There is no CT evidence of acute cortical infarction. Vascular: Extensive intracranial vascular calcifications. No hyperdense vessel identified. Skull: Negative for fracture or focal lesion. Sinuses/Orbits: The visualized paranasal sinuses and mastoid air cells are clear. No orbital abnormalities are seen. Other: None. IMPRESSION: Stable head CT without acute intracranial findings. Stable atrophy and chronic small vessel ischemic changes. Electronically Signed   By: Richardean Sale M.D.   On: 11/06/2018 17:33   Ct Chest Wo Contrast  Result Date: 11/07/2018 CLINICAL DATA:  Increased shortness of breath and weakness starting yesterday. Recent diagnosis of metastatic cancer of uncertain primary. EXAM: CT CHEST WITHOUT CONTRAST TECHNIQUE: Multidetector CT imaging of the chest was performed following the standard protocol without IV contrast. COMPARISON:  PET-CT October 29, 2018 FINDINGS: Cardiovascular: Atherosclerotic changes seen in the nonaneurysmal aorta. The central pulmonary arteries are normal in caliber. Coronary artery calcifications are identified. The heart is unchanged. Mediastinum/Nodes: The thyroid and esophagus are unremarkable. There is a small left pleural effusion. No other effusions. No adenopathy. Lungs/Pleura: Central airways are normal. No pneumothorax. The nodule in the right upper lobe on series 3, image 42 measures 3.1 cm today versus 2.4 cm on the comparison  PET-CT. The nodule in the superior segment of the left lower lobe measures 2.1 cm today and on the previous study. This nodule is seen on series 3, image 61 today. The nodule described in the left lower lobe on the previous study is difficult to measure today due to adjacent infiltrate. The nodule described in the deep right posterior lung base on the previous study is also difficult to measure due to adjacent atelectasis but appears larger in the mid interval measuring 2 cm today versus 1.7 cm previously. Numerous other pulmonary nodules are stable to larger in the interval. There is atelectasis in the right middle lobe. There is atelectasis posteriorly in the right base. Opacity in the left base with an associated small effusion could represent atelectasis or infiltrate/pneumonia. Upper Abdomen: Hepatic steatosis. Known liver lesions are not well assessed on this unenhanced study. No splenic lesions are also not as well seen without contrast. No other changes in the upper abdomen. Musculoskeletal: 20% loss of anterior height of T10 is stable since November 04, 2018 x-rays. IMPRESSION: 1. Bilateral pulmonary nodules, thought to represent metastatic disease on the recent PET-CT, have overall worsened in the interval. 2. There is a small left effusion with underlying opacity. The underlying opacity could represent atelectasis or pneumonia. Recommend clinical correlation and follow-up to resolution. 3. Atherosclerotic change in the nonaneurysmal aorta. Coronary artery calcifications. 4. Known hepatic and splenic lesions are not well seen on today's study due to lack of contrast. 5. Stable mild compression fracture of T10. Aortic Atherosclerosis (ICD10-I70.0). Electronically Signed   By: Dorise Bullion III M.D   On: 11/07/2018 10:43   Ct Abdomen Pelvis W Contrast  Result Date: 11/07/2018 CLINICAL DATA:  Constipation. Nausea and vomiting. Acute abdominal pain. Patient's family reports weakness and lethargy. EXAM:  CT ABDOMEN AND PELVIS WITH CONTRAST TECHNIQUE: Multidetector  CT imaging of the abdomen and pelvis was performed using the standard protocol following bolus administration of intravenous contrast. CONTRAST:  73m OMNIPAQUE IOHEXOL 300 MG/ML  SOLN COMPARISON:  PET CT 10/29/2018, abdominal CT 09/30/2018 FINDINGS: Lower chest: Basilar pulmonary nodules which are recently characterized on PET. Small left pleural effusion which is new. There is adjacent basilar atelectasis. Hepatobiliary: Multiple ill-defined hypodense liver lesions, difficult to define but grossly similar to prior exam. Gallbladder physiologically distended, no calcified stone. No biliary dilatation. Small volume of perihepatic ascites. Pancreas: No ductal dilatation or inflammation. Spleen: Splenomegaly as seen on prior exams, the spleen spans 16.2 cm cranial caudal. Multiple splenic infarcts which have progressed from prior contrast-enhanced abdominal CT. Infarct in the anterior upper spleen has adjacent stranding. Small amount of perisplenic fluid. Multiple additional low-density splenic lesions. Adrenals/Urinary Tract: No adrenal nodule. No hydronephrosis or perinephric edema. Left renal cysts appear similar to prior exam. Urinary bladder is distended. Pelvic floor laxity with cystocele again seen, less pronounced than on prior exam. Stomach/Bowel: Again seen large proximal duodenal diverticulum and small bowel malrotation with duodenum not crossing the midline. Contrast in fluid-filled small bowel that is not dilated or obstructed. Appendix not visualized, prior appendectomy per history. Colonic diverticulosis with resolution of sigmoid diverticulitis from prior exam. Cecum slightly high-riding in the right mid abdomen. Moderate to large volume of colonic stool. Pelvic floor laxity with rectocele, suture line at the and rectal junction. Vascular/Lymphatic: Aortic atherosclerosis and tortuosity. 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 Splenic vein is tortuous and prominent but no evidence of splenic vein thrombosis. 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 This is unchanged from prior exam. No adenopathy. Reproductive: Status post hysterectomy. No adnexal masses. Other: Small amount of perihepatic and perisplenic free fluid. No free air. No intra-abdominal abscess. No abdominal wall hernia. Musculoskeletal: T10 compression fracture is new from prior CT but unchanged from lumbar spine radiograph 11/04/2018. Degenerative change throughout spine. No focal osseous lesion. IMPRESSION: 1. Moderate colonic stool burden suggesting constipation. No bowel obstruction. 2. Splenomegaly with multiple splenic infarcts which have progressed from prior contrast-enhanced abdominal CT. 3. Hepatic metastatic disease, better characterized on recent PET. Lung base pulmonary nodules also better assessed on PET. 4. Small amount of perihepatic and perisplenic ascites. Small left pleural effusion is new from prior exam. 5. Distended urinary bladder, cystocele again seen but less prominent than on prior. 6. Colonic diverticulosis with resolution of sigmoid diverticulitis from prior exam. 7. Chronic findings include small splenic artery aneurysm, duodenal diverticulum with partial bowel malrotation, pelvic floor laxity, and Aortic atherosclerosis (ICD10-I70.0). 8. Mild to moderate T10 compression fracture, unchanged from recent radiographs but new from prior CT. Electronically Signed   By: MKeith RakeM.D.   On: 11/07/2018 04:38   Nm Pet Image Initial (pi) Skull Base To Thigh  Result Date: 10/29/2018 CLINICAL DATA:  Initial treatment strategy for pulmonary nodules. EXAM: NUCLEAR MEDICINE PET SKULL BASE TO THIGH TECHNIQUE: 8.79 mCi F-18 FDG was injected intravenously. Full-ring PET imaging was performed from the skull base to thigh after the  radiotracer. CT data was obtained and used for attenuation correction and anatomic localization. Fasting blood glucose: 92 mg/dl COMPARISON:  CT scan 09/30/2018 FINDINGS: Mediastinal blood pool activity: SUV max 2.67 NECK: No hypermetabolic lymph nodes in the neck. Incidental CT findings: none CHEST: 23.5 mm right upper lobe pulmonary nodule on  image number 70 is hypermetabolic with SUV max of 3.81. 20 mm superior segment left lower lobe pulmonary nodule on image number 80 is hypermetabolic with SUV max of 0.17. 17 mm left lower lobe nodule on image number 510 is hypermetabolic with SUV max of 2.58. 17 mm right basilar nodule on image number 527 is hypermetabolic with SUV max of 7.82. Other small scattered pulmonary nodules are again noted. No enlarged or hypermetabolic mediastinal or hilar lymph nodes. No breast masses, supraclavicular or axillary adenopathy. Incidental CT findings: Stable emphysematous changes and pulmonary scarring. Stable advanced vascular calcifications with three-vessel coronary artery calcifications. ABDOMEN/PELVIS: The segment 5 liver lesion is hypermetabolic and appears larger but difficult to measure without contrast SUV max is 9.06. Smaller lesion in segment 6 is hypermetabolic with SUV max of 4.23. The spleen is diffusely hypermetabolic with multiple splenic lesions as demonstrated on the previous CT scan. No adrenal gland lesions.  No abdominal/pelvic lymphadenopathy. Incidental CT findings: Severe sigmoid colon diverticulosis without findings for acute diverticulitis. SKELETON: No findings for osseous metastatic disease. Incidental CT findings: none IMPRESSION: 1. Multiple hypermetabolic pulmonary nodules most consistent with metastatic disease. No enlarged or hypermetabolic mediastinal or hilar lymph nodes. 2. Hypermetabolic hepatic metastasis. 3. Enlarged and diffusely hypermetabolic spleen. 4. No abdominal/pelvic lymphadenopathy. 5. No obvious primary tumor is identified. A liver  biopsy may be the safest option. Electronically Signed   By: Marijo Sanes M.D.   On: 10/29/2018 20:41    PERFORMANCE STATUS (ECOG) : 3 - Symptomatic, >50% confined to bed  Review of Systems As noted above. Otherwise, a complete review of systems is negative.  Physical Exam General: NAD, frail appearing, thin Pulmonary: Unlabored, on 3 L O2 Extremities: no edema, no joint deformities Skin: no rashes Neurological: Weakness, some intermittent confusion, alert  IMPRESSION: Patient has some confusion today.  However, family say overall that she is much improved today than she was yesterday.  Patient is pending PT evaluation for consideration of rehab.  I met in private with two of patient's daughters, which included patient's healthcare power of attorney.  Together we reviewed patient's current medical problems.  They were aware of the results of the chest CT, which revealed disease progression.  Daughters do not feel that patient could withstand a biopsy nor would they be interested in pursuing work-up or treatment.  We had a lengthy discussion about options for disposition including home with hospice versus rehab.  Daughter say they recognize that patient is likely approaching end-of-life.  They also recognize that she might be incapable of returning to her previous functional baseline.  However, they would like to try a week or two of rehab to see if she can increase her strength and assist with transfers and ambulation.  They then plan to take her home with hospice.  Family are exploring options for hiring caregivers so the patient may have 24/7 care.  We talked about the future option of residential hospice facility.  However, patient has had the perception that hospice facilities hasten one's passing and has been reluctant to consider that option for herself.  However, daughter seemed more open to the idea if needed.  Patient is a DNR.  If she discharges to rehab, she would benefit from  being followed by palliative care in that setting.  Again, family ultimately want to take her home with hospice.  PLAN: Supportive care DNR PT eval for consideration of rehab Family interested in rehab with palliative care following Future involvement of hospice at home  Family said they are not interested in work-up or treatment of the cancer   Time Total: 60 minutes  Visit consisted of counseling and education dealing with the complex and emotionally intense issues of symptom management and palliative care in the setting of serious and potentially life-threatening illness.Greater than 50%  of this time was spent counseling and coordinating care related to the above assessment and plan.  Signed by: Altha Harm, PhD, NP-C [phone] (Work Cell)

## 2018-11-10 NOTE — Care Management Important Message (Signed)
Important Message  Patient Details  Name: Carol Bryan MRN: [id number] Date of Birth: 12/17/1922   Medicare Important Message Given:  Yes    [name] A Lanelle Lindo 11/10/2018, 10:21 AM

## 2018-11-10 NOTE — Clinical Social Work Note (Signed)
CSW spoke with patient's daughter at bedside. Daughters have agreed to [facility]. CSW notified Magda Paganini at [facility] of bed acceptance and Magda Paganini will work on [facility] authorization.   [place], [place]

## 2018-11-10 NOTE — Plan of Care (Signed)

## 2018-11-10 NOTE — Progress Notes (Signed)
Physical Therapy Treatment Patient Details Name: Carol Bryan MRN: [id number] DOB: 1923-05-15 Today's Date: 11/10/2018    History of Present Illness Patient is a 84 year old female who comes to the ED after a fall and bruising to L zygomatic arch 6 days ago. She has presented with increased confusion and weakness and was found to have a UTI and bowel obstruction.  Pt has been receiving HH PT up until the day she came to the hospital.  PMH includes liver CA with metastatis, mild fracture to T10, PR, DVT and L elbow prosthesis.    PT Comments    Pt sleeping lightly in bed.  Family in room.  Reported pt had just had medication that makes her lethargic.  She awoke easily and agreed to supine exercises but requested to remain in bed.  Participated in exercises as described below.  Pt falling asleep at times during exercises.  Family reports she was able to get to chair yesterday for several hours and overall has done a better job with bed mobility today.  SNF remains appropriate for discharge plan.   Follow Up Recommendations  SNF     Equipment Recommendations  None recommended by PT    Recommendations for Other Services       Precautions / Restrictions Precautions Precautions: Fall Restrictions Weight Bearing Restrictions: No    Mobility  Bed Mobility               General bed mobility comments: deferred due to lethargy  Transfers                    Ambulation/Gait                 Stairs             Wheelchair Mobility    Modified Rankin (Stroke Patients Only)       Balance                                            Cognition Arousal/Alertness: Lethargic Behavior During Therapy: WFL for tasks assessed/performed                                   General Comments: Had just received medications which family reports make her lathargic.      Exercises Other Exercises Other Exercises: supine AAROM  ankle pumps, heel slides, ab/add and SLR x 10 BLE    General Comments        Pertinent Vitals/Pain Pain Assessment: No/denies pain    Home Living                      Prior Function            PT Goals (current goals can now be found in the care plan section) Progress towards PT goals: Progressing toward goals    Frequency    Min 2X/week      PT Plan Current plan remains appropriate    Co-evaluation              AM-PAC PT "6 Clicks" Mobility   Outcome Measure  Help needed turning from your back to your side while in a flat bed without using bedrails?: A Lot Help needed moving from lying on your back  to sitting on the side of a flat bed without using bedrails?: A Lot Help needed moving to and from a bed to a chair (including a wheelchair)?: A Lot Help needed standing up from a chair using your arms (e.g., wheelchair or bedside chair)?: A Lot Help needed to walk in hospital room?: A Lot Help needed climbing 3-5 steps with a railing? : A Lot 6 Click Score: 12    End of Session   Activity Tolerance: Patient limited by lethargy Patient left: in bed;with call bell/phone within reach;with bed alarm set;with family/visitor present         Time: 1132-1140 PT Time Calculation (min) (ACUTE ONLY): 8 min  Charges:  $Therapeutic Exercise: 8-22 mins                     Chesley Noon, PTA 11/10/18, 11:47 AM

## 2018-11-10 NOTE — Progress Notes (Signed)
Advanced care plan.  Purpose of the Encounter: Further goals of care  Parties in Attendance: Patient herself and 2 daughters Patient's Decision Capacity: Intact  Subjective/Patient's story: Patient is a 84 year old with metastatic cancer to her lungs and liver, who is already a DNR, who is noted to now have respiratory failure urinary retention.   Objective/Medical story Discussed with the family regarding overall poor prognosis and condition. I discussed options regarding further treatment and future goals of care. Options presented to the patient were Home with hospice Rehab with palliative care Hospice home  Goals of care determination:  Daughter and patient state that they are leaning more towards rehab with palliative care following her there with progression to hospice if condition deteriorate deteriorate   CODE STATUS: DNR   Time spent discussing advanced care planning: 16 minutes

## 2018-11-11 DIAGNOSIS — E860 Dehydration: Secondary | ICD-10-CM

## 2018-11-11 LAB — CREATININE, SERUM
Creatinine, Ser: 1.01 mg/dL — ABNORMAL HIGH (ref 0.44–1.00)
GFR calc Af Amer: 56 mL/min — ABNORMAL LOW (ref >60)
GFR calc non Af Amer: 48 mL/min — ABNORMAL LOW (ref >60)

## 2018-11-11 MED ORDER — MORPHINE SULFATE (CONCENTRATE) 10 MG/0.5ML PO SOLN
5.0000 mg | ORAL | Status: DC | PRN
  Administered 2018-11-11 – 2018-11-12 (×4): 5 mg via ORAL
  Filled 2018-11-11 (×4): qty 1

## 2018-11-11 MED ORDER — LORAZEPAM 0.5 MG PO TABS
0.5000 mg | ORAL_TABLET | Freq: Four times a day (QID) | ORAL | Status: DC | PRN
  Administered 2018-11-12: 11:00:00 0.5 mg via ORAL
  Filled 2018-11-11: qty 1

## 2018-11-11 NOTE — Progress Notes (Addendum)
Pt more oriented at first of shift, as night progressed pt more confused alert to self only, talking to herself most of the night, did not sleep entire night encouraged pt to sleep, pt currently on phone with daughter per pts request to call and speak with her, pt remains confused

## 2018-11-11 NOTE — Progress Notes (Signed)
[place]  Telephone:([phone] [phone]   Name: LAURENE MELENDREZ Date: 11/11/2018 MRN: [id number]  DOB: Jul 24, 1923  Patient Care Team: Adin Hector, MD as PCP - General (Internal Medicine) Corey Skains, MD as Consulting Physician (Cardiology)    REASON FOR CONSULTATION: Palliative Care consult requested for this89 y.o.femalewith multiple medical problems including a flutter status post cardioversion, RA, history of DVT/PE, Sjogren's disease, hypothyroidism.Patient was recently hospitalized 09/30/2018 to 10/03/2018 with acute diverticulitis and was incidentally found to have multiple pulmonary, liver, and spleen nodules, all of which appeared hypermetabolic on subsequent PET scan. Patient was referred to oncology and wanted to pursue liver biopsy. However, she was thought likely to be a poor candidate for treatment.  Patient was admitted to the hospital on 11/06/2018 with weakness and respiratory failure.  Repeat CT of the chest revealed disease progression with worsened bilateral pulmonary nodules when compared to recent PET scan.  Palliative care was consulted to help address goals.  CODE STATUS: DNR  PAST MEDICAL HISTORY: Past Medical History:  Diagnosis Date  . Atrial flutter ([place])    cardioverted  . Cancer [facility])    recent dx'd with Lung and Liver cancer per daughter.  . Collagen vascular disease ([place])    RA  . DVT (deep venous thrombosis) ([place])   . HLD (hyperlipidemia)   . Hypertension   . Hypothyroid   . Pulmonary embolism ([place])   . RA (rheumatoid arthritis) ([place])   . Sjogren's disease ([place])     PAST SURGICAL HISTORY:  Past Surgical History:  Procedure Laterality Date  . ABDOMINAL HYSTERECTOMY    . APPENDECTOMY    . ASD REPAIR  age 87  . cataracts    . JOINT REPLACEMENT Left    elbow  . PATENT DUCTUS ARTERIOUS REPAIR      HEMATOLOGY/ONCOLOGY HISTORY:   No history exists.    ALLERGIES:   is allergic to ciprofloxacin; methotrexate; gold-containing drug products; other; and sulfa antibiotics.  MEDICATIONS:  Current Facility-Administered Medications  Medication Dose Route Frequency Provider Last Rate Last Dose  . acetaminophen (TYLENOL) tablet 650 mg  650 mg Oral Q6H PRN Dustin Flock, MD       Or  . acetaminophen (TYLENOL) suppository 650 mg  650 mg Rectal Q6H PRN Dustin Flock, MD      . amitriptyline (ELAVIL) tablet 20 mg  20 mg Oral QHS Eula Listen, MD   20 mg at 11/10/18 2008  . apixaban (ELIQUIS) tablet 2.5 mg  2.5 mg Oral BID Dustin Flock, MD   2.5 mg at 11/11/18 1018  . azelastine (ASTELIN) 0.1 % nasal spray 1 spray  1 spray Each Nare BID PRN Dustin Flock, MD      . bisacodyl (DULCOLAX) suppository 10 mg  10 mg Rectal Daily PRN Lance Coon, MD   10 mg at 11/10/18 0414  . cholecalciferol (VITAMIN D3) tablet 1,000 Units  1,000 Units Oral Daily Dustin Flock, MD   1,000 Units at 11/11/18 1022  . folic acid (FOLVITE) tablet 3 mg  3 mg Oral Daily Eula Listen, MD   3 mg at 11/11/18 1017  . furosemide (LASIX) tablet 20 mg  20 mg Oral BID Dustin Flock, MD   20 mg at 11/11/18 1021  . HYDROcodone-acetaminophen (NORCO/VICODIN) 5-325 MG per tablet 1-2 tablet  1-2 tablet Oral Q4H PRN Dustin Flock, MD   1 tablet at 11/11/18 1109  . HYDROmorphone (DILAUDID) injection 1 mg  1 mg  Intravenous Q3H PRN Dustin Flock, MD      . hydroxychloroquine (PLAQUENIL) tablet 200 mg  200 mg Oral Daily Eula Listen, MD   200 mg at 11/11/18 1022  . levofloxacin (LEVAQUIN) tablet 250 mg  250 mg Oral Daily Dustin Flock, MD   250 mg at 11/11/18 1020  . levothyroxine (SYNTHROID, LEVOTHROID) tablet 50 mcg  50 mcg Oral QAC breakfast Eula Listen, MD   50 mcg at 11/11/18 1022  . metoprolol succinate (TOPROL-XL) 24 hr tablet 12.5 mg  12.5 mg Oral Daily Eula Listen, MD   12.5 mg at 11/11/18 1021  . ondansetron (ZOFRAN) tablet 4 mg  4 mg Oral  Q6H PRN Dustin Flock, MD       Or  . ondansetron (ZOFRAN) injection 4 mg  4 mg Intravenous Q6H PRN Dustin Flock, MD      . pantoprazole (PROTONIX) EC tablet 40 mg  40 mg Oral Daily Eula Listen, MD   40 mg at 11/11/18 1020  . polyethylene glycol (MIRALAX / GLYCOLAX) packet 17 g  17 g Oral Daily Dustin Flock, MD   17 g at 11/11/18 1012  . polyvinyl alcohol (LIQUIFILM TEARS) 1.4 % ophthalmic solution 1 drop  1 drop Both Eyes PRN Dustin Flock, MD      . potassium chloride SA (K-DUR,KLOR-CON) CR tablet 20 mEq  20 mEq Oral Daily Eula Listen, MD   20 mEq at 11/10/18 0938  . predniSONE (DELTASONE) tablet 5 mg  5 mg Oral BID WC Dustin Flock, MD   5 mg at 11/11/18 1021  . prochlorperazine (COMPAZINE) tablet 10 mg  10 mg Oral Q6H PRN Dustin Flock, MD        VITAL SIGNS: BP 134/74 (BP Location: Left Arm)   Pulse 86   Temp (!) 97.4 F (36.3 C)   Resp 18   Ht 5\' 5"  (1.651 m)   Wt 154 lb (69.9 kg)   SpO2 100%   BMI 25.63 kg/m  Filed Weights   11/06/18 1649  Weight: 154 lb (69.9 kg)    Estimated body mass index is 25.63 kg/m as calculated from the following:   Height as of this encounter: 5\' 5"  (1.651 m).   Weight as of this encounter: 154 lb (69.9 kg).  LABS: CBC:    Component Value Date/Time   WBC 3.1 (L) 11/10/2018 0600   HGB 9.4 (L) 11/10/2018 0600   HGB 12.5 07/27/2014 1505   HCT 32.1 (L) 11/10/2018 0600   HCT 38.0 07/27/2014 1505   PLT 179 11/10/2018 0600   PLT 194 07/27/2014 1505   MCV 92.5 11/10/2018 0600   MCV 92 07/27/2014 1505   NEUTROABS 2.4 10/20/2018 1119   LYMPHSABS 0.4 (L) 10/20/2018 1119   MONOABS 0.2 10/20/2018 1119   EOSABS 0.0 10/20/2018 1119   BASOSABS 0.0 10/20/2018 1119   Comprehensive Metabolic Panel:    Component Value Date/Time   NA 135 11/08/2018 1341   NA 140 06/20/2013 0605   K 4.3 11/08/2018 1341   K 3.4 (L) 06/20/2013 0605   CL 107 11/08/2018 1341   CL 105 06/20/2013 0605   CO2 17 (L) 11/08/2018 1341    CO2 29 06/20/2013 0605   BUN 23 11/08/2018 1341   BUN 25 (H) 06/20/2013 0605   CREATININE 0.94 11/08/2018 1341   CREATININE 1.10 06/20/2013 0605   GLUCOSE 73 11/08/2018 1341   GLUCOSE 114 (H) 06/20/2013 0605   CALCIUM 7.1 (L) 11/08/2018 1341   CALCIUM 8.6 06/20/2013 4403  AST 25 11/06/2018 2130   AST 26 06/20/2013 0605   ALT 14 11/06/2018 2130   ALT 20 06/20/2013 0605   ALKPHOS 149 (H) 11/06/2018 2130   ALKPHOS 78 06/20/2013 0605   BILITOT 0.7 11/06/2018 2130   BILITOT 0.6 06/20/2013 0605   PROT 6.3 (L) 11/06/2018 2130   PROT 6.5 06/20/2013 0605   ALBUMIN 2.4 (L) 11/06/2018 2130   ALBUMIN 3.4 06/20/2013 0605    RADIOGRAPHIC STUDIES: Dg Chest 2 View  Result Date: 11/06/2018 CLINICAL DATA:  Increasing weakness. Liver lung metastases of unknown primary. EXAM: CHEST - 2 VIEW COMPARISON:  Thoracic spine x-ray November 04, 2018 FINDINGS: A nodule or mass in the right upper lobe is again identified. A torturous thoracic aorta is stable. The heart, hila, and mediastinum are unchanged. No pneumothorax. No focal infiltrate. Minimal atelectasis in the left base. The suspected compression fracture of a lower thoracic vertebral bodies seen on the November 04, 2018 thoracic spine x-ray is unchanged. IMPRESSION: 1. Again noted is a lower thoracic vertebral body compression fracture, thought to be new on the November 04, 2018 comparison. 2. Persistent nodule/mass in the right upper lung. Electronically Signed   By: Dorise Bullion III M.D   On: 11/06/2018 17:24   Dg Thoracic Spine 2 View  Result Date: 11/04/2018 CLINICAL DATA:  Fall with left-sided back pain EXAM: THORACIC SPINE 2 VIEWS; LUMBAR SPINE - COMPLETE 4+ VIEW COMPARISON:  Chest radiograph 09/30/2018 FINDINGS: THORACIC SPINE: There is moderate height loss of T10 that appears new compared to the chest radiograph 09/30/2018. No other focal abnormality is visualized within the thoracic spine. Alignment of the thoracic spine is normal. LUMBAR  SPINE: There is unchanged grade 1 anterolisthesis at L3-L4. No lumbar spine fracture. IMPRESSION: Mild-to-moderate height loss of T10, likely new compared to chest radiograph 09/30/2018 and concerning for fracture. MRI may be helpful for further assessment. These results will be called to the ordering clinician or representative by the Radiologist Assistant, and communication documented in the PACS or zVision Dashboard. Electronically Signed   By: Ulyses Jarred M.D.   On: 11/04/2018 16:50   Dg Lumbar Spine Complete  Result Date: 11/04/2018 CLINICAL DATA:  Fall with left-sided back pain EXAM: THORACIC SPINE 2 VIEWS; LUMBAR SPINE - COMPLETE 4+ VIEW COMPARISON:  Chest radiograph 09/30/2018 FINDINGS: THORACIC SPINE: There is moderate height loss of T10 that appears new compared to the chest radiograph 09/30/2018. No other focal abnormality is visualized within the thoracic spine. Alignment of the thoracic spine is normal. LUMBAR SPINE: There is unchanged grade 1 anterolisthesis at L3-L4. No lumbar spine fracture. IMPRESSION: Mild-to-moderate height loss of T10, likely new compared to chest radiograph 09/30/2018 and concerning for fracture. MRI may be helpful for further assessment. These results will be called to the ordering clinician or representative by the Radiologist Assistant, and communication documented in the PACS or zVision Dashboard. Electronically Signed   By: Ulyses Jarred M.D.   On: 11/04/2018 16:50   Ct Head Wo Contrast  Result Date: 11/06/2018 CLINICAL DATA:  Increasing weakness and lethargy today. History of atrial fibrillation and lung cancer. EXAM: CT HEAD WITHOUT CONTRAST TECHNIQUE: Contiguous axial images were obtained from the base of the skull through the vertex without intravenous contrast. COMPARISON:  CT head 05/12/2017.  PET-CT 10/29/2018. FINDINGS: Brain: There is no evidence of acute intracranial hemorrhage, mass lesion, brain edema or extra-axial fluid collection. There is atrophy  with diffuse prominence of the ventricles and subarachnoid spaces. There is chronic low-density in the  periventricular white matter and chronic symmetric calcifications of the basal ganglia. There is no CT evidence of acute cortical infarction. Vascular: Extensive intracranial vascular calcifications. No hyperdense vessel identified. Skull: Negative for fracture or focal lesion. Sinuses/Orbits: The visualized paranasal sinuses and mastoid air cells are clear. No orbital abnormalities are seen. Other: None. IMPRESSION: Stable head CT without acute intracranial findings. Stable atrophy and chronic small vessel ischemic changes. Electronically Signed   By: Richardean Sale M.D.   On: 11/06/2018 17:33   Ct Chest Wo Contrast  Result Date: 11/07/2018 CLINICAL DATA:  Increased shortness of breath and weakness starting yesterday. Recent diagnosis of metastatic cancer of uncertain primary. EXAM: CT CHEST WITHOUT CONTRAST TECHNIQUE: Multidetector CT imaging of the chest was performed following the standard protocol without IV contrast. COMPARISON:  PET-CT October 29, 2018 FINDINGS: Cardiovascular: Atherosclerotic changes seen in the nonaneurysmal aorta. The central pulmonary arteries are normal in caliber. Coronary artery calcifications are identified. The heart is unchanged. Mediastinum/Nodes: The thyroid and esophagus are unremarkable. There is a small left pleural effusion. No other effusions. No adenopathy. Lungs/Pleura: Central airways are normal. No pneumothorax. The nodule in the right upper lobe on series 3, image 42 measures 3.1 cm today versus 2.4 cm on the comparison PET-CT. The nodule in the superior segment of the left lower lobe measures 2.1 cm today and on the previous study. This nodule is seen on series 3, image 61 today. The nodule described in the left lower lobe on the previous study is difficult to measure today due to adjacent infiltrate. The nodule described in the deep right posterior lung base on  the previous study is also difficult to measure due to adjacent atelectasis but appears larger in the mid interval measuring 2 cm today versus 1.7 cm previously. Numerous other pulmonary nodules are stable to larger in the interval. There is atelectasis in the right middle lobe. There is atelectasis posteriorly in the right base. Opacity in the left base with an associated small effusion could represent atelectasis or infiltrate/pneumonia. Upper Abdomen: Hepatic steatosis. Known liver lesions are not well assessed on this unenhanced study. No splenic lesions are also not as well seen without contrast. No other changes in the upper abdomen. Musculoskeletal: 20% loss of anterior height of T10 is stable since November 04, 2018 x-rays. IMPRESSION: 1. Bilateral pulmonary nodules, thought to represent metastatic disease on the recent PET-CT, have overall worsened in the interval. 2. There is a small left effusion with underlying opacity. The underlying opacity could represent atelectasis or pneumonia. Recommend clinical correlation and follow-up to resolution. 3. Atherosclerotic change in the nonaneurysmal aorta. Coronary artery calcifications. 4. Known hepatic and splenic lesions are not well seen on today's study due to lack of contrast. 5. Stable mild compression fracture of T10. Aortic Atherosclerosis (ICD10-I70.0). Electronically Signed   By: Dorise Bullion III M.D   On: 11/07/2018 10:43   Ct Abdomen Pelvis W Contrast  Result Date: 11/07/2018 CLINICAL DATA:  Constipation. Nausea and vomiting. Acute abdominal pain. Patient's family reports weakness and lethargy. EXAM: CT ABDOMEN AND PELVIS WITH CONTRAST TECHNIQUE: Multidetector CT imaging of the abdomen and pelvis was performed using the standard protocol following bolus administration of intravenous contrast. CONTRAST:  73mL OMNIPAQUE IOHEXOL 300 MG/ML  SOLN COMPARISON:  PET CT 10/29/2018, abdominal CT 09/30/2018 FINDINGS: Lower chest: Basilar pulmonary nodules  which are recently characterized on PET. Small left pleural effusion which is new. There is adjacent basilar atelectasis. Hepatobiliary: Multiple ill-defined hypodense liver lesions, difficult to define but  grossly similar to prior exam. Gallbladder physiologically distended, no calcified stone. No biliary dilatation. Small volume of perihepatic ascites. Pancreas: No ductal dilatation or inflammation. Spleen: Splenomegaly as seen on prior exams, the spleen spans 16.2 cm cranial caudal. Multiple splenic infarcts which have progressed from prior contrast-enhanced abdominal CT. Infarct in the anterior upper spleen has adjacent stranding. Small amount of perisplenic fluid. Multiple additional low-density splenic lesions. Adrenals/Urinary Tract: No adrenal nodule. No hydronephrosis or perinephric edema. Left renal cysts appear similar to prior exam. Urinary bladder is distended. Pelvic floor laxity with cystocele again seen, less pronounced than on prior exam. Stomach/Bowel: Again seen large proximal duodenal diverticulum and small bowel malrotation with duodenum not crossing the midline. Contrast in fluid-filled small bowel that is not dilated or obstructed. Appendix not visualized, prior appendectomy per history. Colonic diverticulosis with resolution of sigmoid diverticulitis from prior exam. Cecum slightly high-riding in the right mid abdomen. Moderate to large volume of colonic stool. Pelvic floor laxity with rectocele, suture line at the and rectal junction. Vascular/Lymphatic: Aortic atherosclerosis and tortuosity. 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 Splenic vein is tortuous and prominent but no evidence of splenic vein thrombosis. 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 This is unchanged from prior exam. No adenopathy. Reproductive: Status post hysterectomy. No adnexal  masses. Other: Small amount of perihepatic and perisplenic free fluid. No free air. No intra-abdominal abscess. No abdominal wall hernia. Musculoskeletal: T10 compression fracture is new from prior CT but unchanged from lumbar spine radiograph 11/04/2018. Degenerative change throughout spine. No focal osseous lesion. IMPRESSION: 1. Moderate colonic stool burden suggesting constipation. No bowel obstruction. 2. Splenomegaly with multiple splenic infarcts which have progressed from prior contrast-enhanced abdominal CT. 3. Hepatic metastatic disease, better characterized on recent PET. Lung base pulmonary nodules also better assessed on PET. 4. Small amount of perihepatic and perisplenic ascites. Small left pleural effusion is new from prior exam. 5. Distended urinary bladder, cystocele again seen but less prominent than on prior. 6. Colonic diverticulosis with resolution of sigmoid diverticulitis from prior exam. 7. Chronic findings include small splenic artery aneurysm, duodenal diverticulum with partial bowel malrotation, pelvic floor laxity, and Aortic atherosclerosis (ICD10-I70.0). 8. Mild to moderate T10 compression fracture, unchanged from recent radiographs but new from prior CT. Electronically Signed   By: Keith Rake M.D.   On: 11/07/2018 04:38   Nm Pet Image Initial (pi) Skull Base To Thigh  Result Date: 10/29/2018 CLINICAL DATA:  Initial treatment strategy for pulmonary nodules. EXAM: NUCLEAR MEDICINE PET SKULL BASE TO THIGH TECHNIQUE: 8.79 mCi F-18 FDG was injected intravenously. Full-ring PET imaging was performed from the skull base to thigh after the radiotracer. CT data was obtained and used for attenuation correction and anatomic localization. Fasting blood glucose: 92 mg/dl COMPARISON:  CT scan 09/30/2018 FINDINGS: Mediastinal blood pool activity: SUV max 2.67 NECK: No hypermetabolic lymph nodes in the neck. Incidental CT findings: none CHEST: 23.5 mm right upper lobe pulmonary nodule on  image number 70 is hypermetabolic with SUV max of 9.56. 20 mm superior segment left lower lobe pulmonary nodule on image number 80 is hypermetabolic with SUV max of 2.13. 17 mm left lower lobe nodule on image number 086 is hypermetabolic with SUV max of 5.78. 17 mm right basilar nodule on image number 469 is hypermetabolic with SUV max of 6.29. Other small scattered pulmonary nodules are again  noted. No enlarged or hypermetabolic mediastinal or hilar lymph nodes. No breast masses, supraclavicular or axillary adenopathy. Incidental CT findings: Stable emphysematous changes and pulmonary scarring. Stable advanced vascular calcifications with three-vessel coronary artery calcifications. ABDOMEN/PELVIS: The segment 5 liver lesion is hypermetabolic and appears larger but difficult to measure without contrast SUV max is 9.06. Smaller lesion in segment 6 is hypermetabolic with SUV max of 0.09. The spleen is diffusely hypermetabolic with multiple splenic lesions as demonstrated on the previous CT scan. No adrenal gland lesions.  No abdominal/pelvic lymphadenopathy. Incidental CT findings: Severe sigmoid colon diverticulosis without findings for acute diverticulitis. SKELETON: No findings for osseous metastatic disease. Incidental CT findings: none IMPRESSION: 1. Multiple hypermetabolic pulmonary nodules most consistent with metastatic disease. No enlarged or hypermetabolic mediastinal or hilar lymph nodes. 2. Hypermetabolic hepatic metastasis. 3. Enlarged and diffusely hypermetabolic spleen. 4. No abdominal/pelvic lymphadenopathy. 5. No obvious primary tumor is identified. A liver biopsy may be the safest option. Electronically Signed   By: Marijo Sanes M.D.   On: 10/29/2018 20:41    PERFORMANCE STATUS (ECOG) : 4 - Bedbound  Review of Systems As noted above. Otherwise, a complete review of systems is negative.  Physical Exam General: ill appearing Pulmonary: unlabored GU: no suprapubic tenderness Extremities:  no edema Skin: no rashes Neurological: wakes briefly when stimulated, lethargic, confused  IMPRESSION: Follow-up visit made.  Patient is more lethargic today.  Oral intake is minimal.  She is only eating bites and sips.  Apparently she did not sleep well last night and had increasing confusion.  I spoke with two daughters who are at bedside.  Both feel she is clinically declined and that she is rapidly approaching end-of-life.  They no longer want her to go to rehab.  Instead they are interested in either taking her home with hospice or discharging to the hospice home.  Will ask social work to complete a hospice referral.  Case discussed with attending.  PLAN: Supportive care Social work referral Hospice referral for either home or residential hospice facility  Time Total: 30 minutes  Visit consisted of counseling and education dealing with the complex and emotionally intense issues of symptom management and palliative care in the setting of serious and potentially life-threatening illness.Greater than 50%  of this time was spent counseling and coordinating care related to the above assessment and plan.  Signed by: Altha Harm, PhD, NP-C [phone] (Work Cell)

## 2018-11-11 NOTE — Progress Notes (Signed)
[facility] [place] at [place] NAME: Carol Bryan    MR#:  [id number]  DATE OF BIRTH:  1923-08-13  SUBJECTIVE:   Overnight patient's mental status has somewhat deteriorated.  She is more lethargic and confused as per the family at bedside.  P.o. intake remains poor.  Palliative care is following and plan was for possible discharge to short-term rehab with palliative/hospice to follow but the family would prefer discharge to hospice services now.  REVIEW OF SYSTEMS:    Review of Systems  Unable to perform ROS: Mental acuity    Nutrition: Dysphagia III Tolerating Diet: Little   DRUG ALLERGIES:   Allergies  Allergen Reactions  . Ciprofloxacin Other (See Comments)    Foot burning; tolerates levaquin  . Methotrexate Other (See Comments)    leukopenia  . Gold-Containing Drug Products Rash  . Other Other (See Comments) and Rash  . Sulfa Antibiotics Rash    VITALS:  Blood pressure 134/74, pulse 86, temperature (!) 97.4 F (36.3 C), resp. rate 18, height 5\' 5"  (1.651 m), weight 69.9 kg, SpO2 100 %.  PHYSICAL EXAMINATION:   Physical Exam  GENERAL:  84 y.o.-year-old patient lying in bed lethargic/encephalopathic.  EYES: Pupils equal, round, reactive to light and accommodation. No scleral icterus. Extraocular muscles intact.  HEENT: Head atraumatic, normocephalic. Oropharynx and nasopharynx clear.  NECK:  Supple, no jugular venous distention. No thyroid enlargement, no tenderness.  LUNGS: Poor Resp. effort, no wheezing, rales, rhonchi. No use of accessory muscles of respiration.  CARDIOVASCULAR: S1, S2 normal. No murmurs, rubs, or gallops.  ABDOMEN: Soft, nontender, nondistended. Bowel sounds present. No organomegaly or mass.  EXTREMITIES: No cyanosis, clubbing or edema b/l.    NEUROLOGIC: Cranial nerves II through XII are intact. No focal Motor or sensory deficits b/l. Globally weak.    PSYCHIATRIC: The patient is alert and oriented x 1.    SKIN: No obvious rash, lesion, or ulcer.    LABORATORY PANEL:   CBC Recent Labs  Lab 11/10/18 0600  WBC 3.1*  HGB 9.4*  HCT 32.1*  PLT 179   ------------------------------------------------------------------------------------------------------------------  Chemistries  Recent Labs  Lab 11/06/18 2130 11/08/18 1341  NA  --  135  K  --  4.3  CL  --  107  CO2  --  17*  GLUCOSE  --  73  BUN  --  23  CREATININE  --  0.94  CALCIUM  --  7.1*  AST 25  --   ALT 14  --   ALKPHOS 149*  --   BILITOT 0.7  --    ------------------------------------------------------------------------------------------------------------------  Cardiac Enzymes Recent Labs  Lab 11/06/18 2124  TROPONINI 0.06*   ------------------------------------------------------------------------------------------------------------------  RADIOLOGY:  No results found.   ASSESSMENT AND PLAN:   84 year old female with past medical history of arthritis, Sjogren's disease, history of pulmonary embolism, hypertension, hyperlipidemia, atrial flutter, recent admission for acute diverticulitis who was admitted to the hospital due to generalized weakness, shortness of breath.  1.  Acute respiratory failure with hypoxia- secondary to suspected metastatic disease from unknown carcinoma. -Continue O2 supplementation.  2.  Metastatic carcinoma with unknown primary-patient had a PET scan done as an outpatient which showed pulmonary nodules and multiple hepatic metastases.  Plan was for possible liver biopsy but patient's condition had declined and she therefore came to the hospital. -Patient's family does not want to pursue further care and have opted for hospice services.  3.  Failure to thrive/poor appetite-secondary  to underlying malignancy and other comorbidities. - Continue to encourage p.o. intake continue nutritional supplements.  4.  History of previous DVT/PE-continue Eliquis.  5.  History of rheumatoid  arthritis-continue Plaquenil, maintenance prednisone.  6.  Hypothyroidism-continue Synthroid.  7.  History of atrial flutter-rate controlled.  Continue Toprol continue Eliquis.  Plan was for possible discharge to short-term rehab with palliative/hospice to follow but patient's condition has declined overnight and the family is now contemplating possible discharge home with hospice versus hospice home.   All the records are reviewed and case discussed with Care Management/Social Worker. Management plans discussed with the patient, family and they are in agreement.  CODE STATUS: DNR  DVT Prophylaxis: Eliquis  TOTAL TIME TAKING CARE OF THIS PATIENT: 30 minutes.   POSSIBLE D/C IN 1-2 DAYS, DEPENDING ON CLINICAL CONDITION.   Henreitta Leber M.D on 11/11/2018 at 1:31 PM  Between 7am to 6pm - Pager - [phone]  After 6pm go to [email or web address] - Proofreader  [facility]  Office  [phone]  CC: Primary care physician; Adin Hector, MD

## 2018-11-11 NOTE — Clinical Social Work Note (Signed)
CSW notified by Billey Chang, Palliative NP that patient has declined over night and family is now interested in hospice services. CSW met with patient's daughter Steffanie Rainwater at bedside. Daughter states that she would like hospice of [facility] but is unsure of hospice at home or hospice home. Daughter asked if they could speak with hospice regarding services and options. CSW notified Santiago Glad, hospice liaison of referral and request. CSW also notified RNCM of above. CSW will follow up with family after they meet with hospice liaison.   [place], [place]

## 2018-11-11 NOTE — Progress Notes (Signed)
New referral for [facility] services at home received from [place] following a Palliative Medicine consult. Patient is a 84 year old woman with a known history of arthritis, Sjogren's disease, pulmonary embolism, hypertension, hyperlipidemia, atrial flutter s/p cardioverson, recent admission for acute diverticulitis 09/2018, admitted to [facility] on 12/14 due to generalized weakness and respiratory failure, now requiring oxygen. During previous admission patient was incidentally found to have multiple pulmonary, liver and spleen nodules,  patient was referred to oncology with plan for liver biopsy. Patient was not considered a candidate for treatment. This hospitalization repeat chest CT showed worsening of pulmonary nodules. Palliative medicine was consulted for goals of care. patient has continued to decline in functional and cognitive status. Family has chosen to have patient return home with the support of hospice services.  Writer met in the day room with patient 3 daughters, Vinson Moselle and Zigmund Daniel to initiate education regarding hospice services, philosophy and team approach to care with understanding voiced. Questions answered. Patient will require oxygen and a hospital bed to be in place prior to discharge. DMe has been requested to be delivered tomorrow with plan for rd/c after DME is in place. Patient will require EMS transport and with signed portable DNR in place. Will continue to follow through discharge. Patient information faxed to referral.  Flo Shanks RN, BSN, [facility] [place], hospital Liaison [phone]

## 2018-11-12 ENCOUNTER — Telehealth: Payer: Self-pay | Admitting: *Deleted

## 2018-11-12 IMAGING — US US EXTREM LOW VENOUS BILAT
1 series · 13 of 24 positions shown · non-contrast
Comparison: 12/25/2016

CLINICAL DATA: Dyspnea.  Prior LEFT lower extremity DVT



[Series 1: us extrem low venous bilat · 0.07mm/px · 13 of 59 slices shown]
[im 1/59]
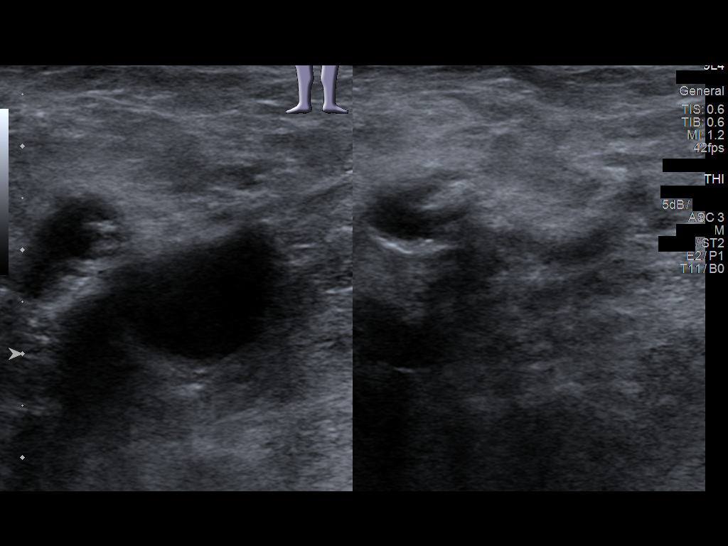
[im 6/59]
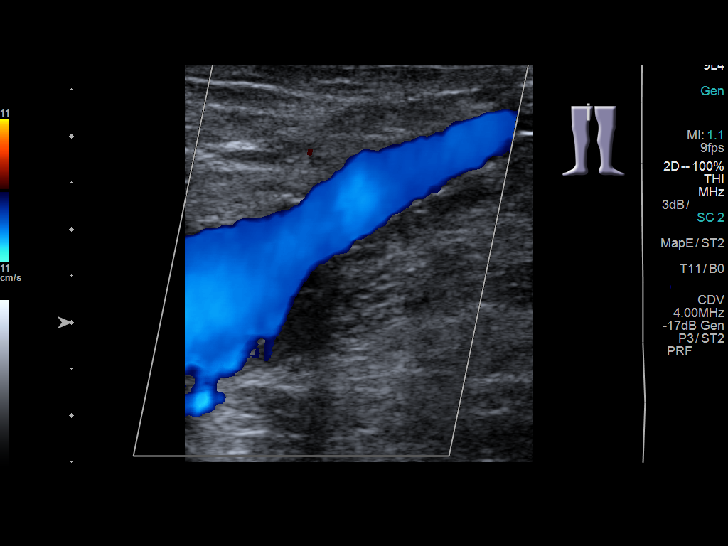
[im 11/59]
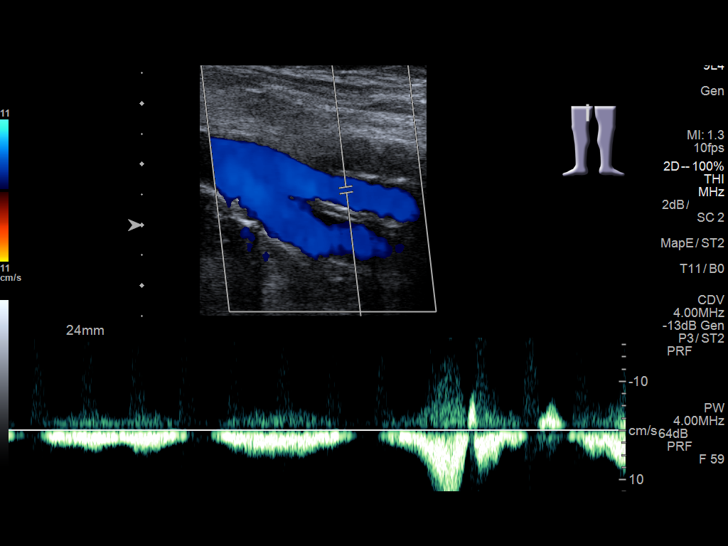
[im 16/59]
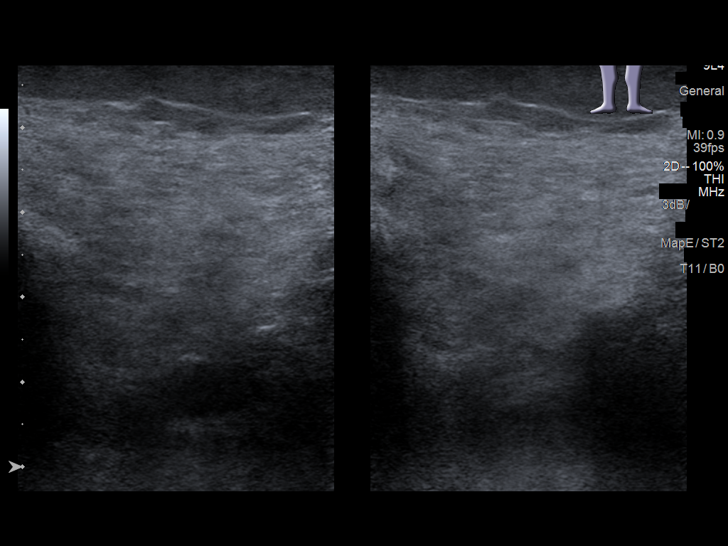
[im 21/59]
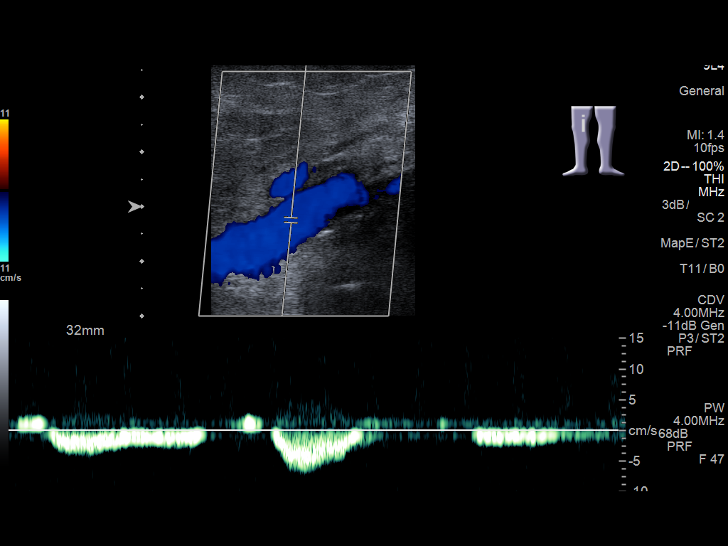
[im 26/59]
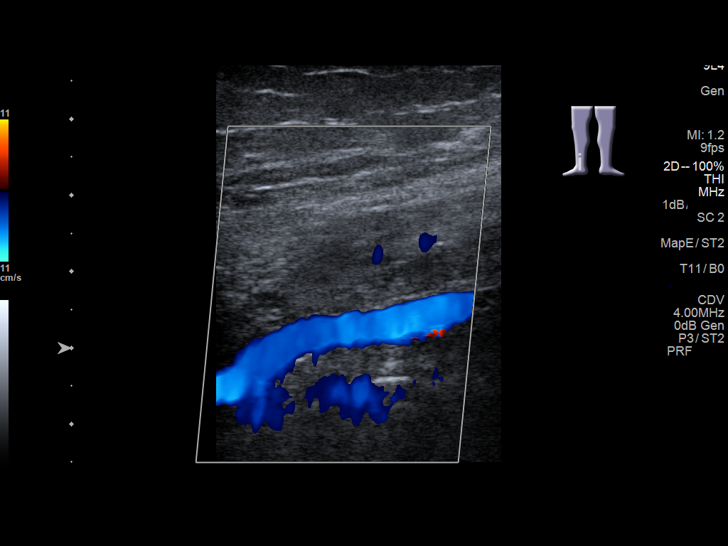
[im 31/59]
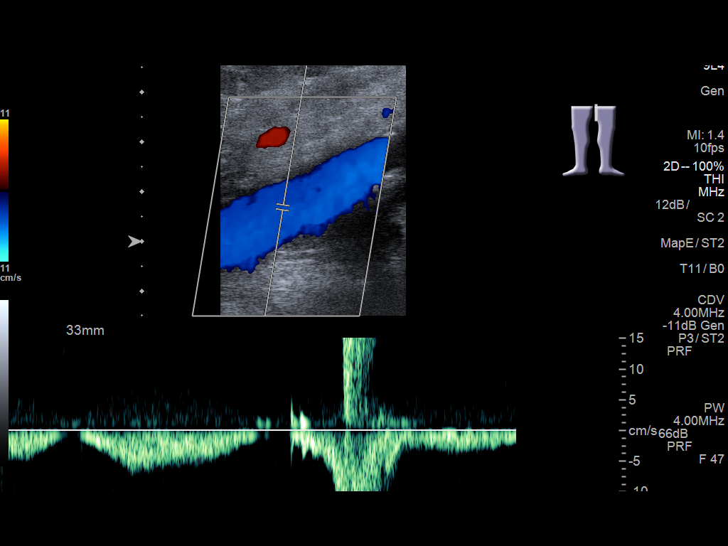
[im 33/59]
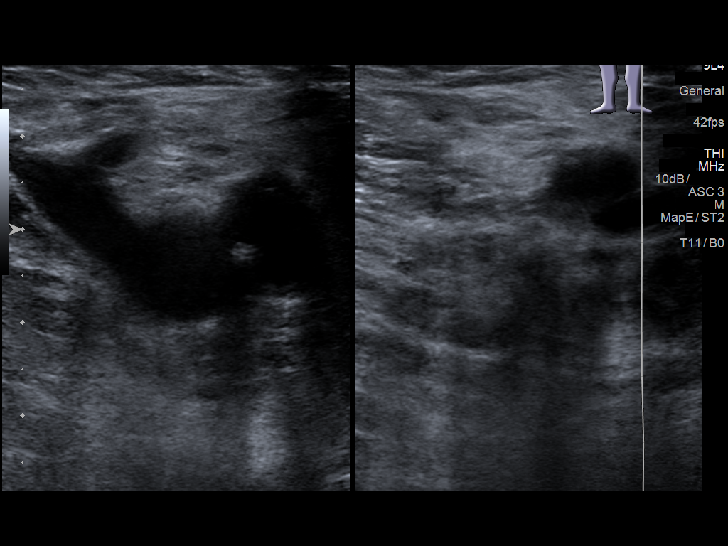
[im 38/59]
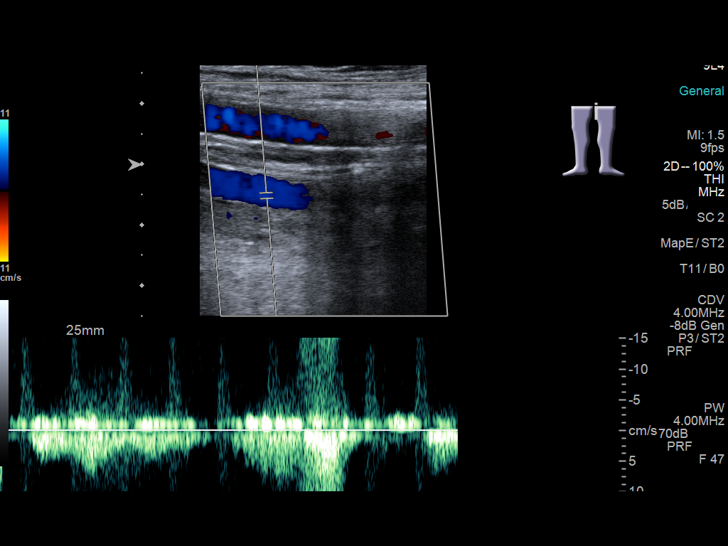
[im 43/59]
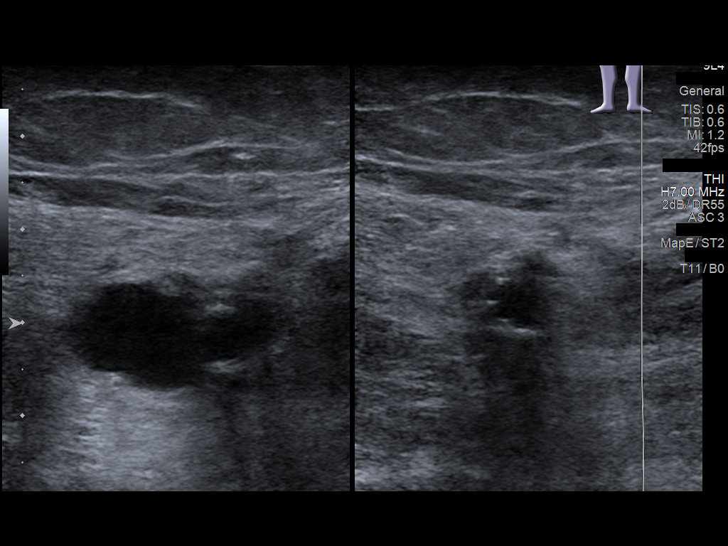
[im 48/59]
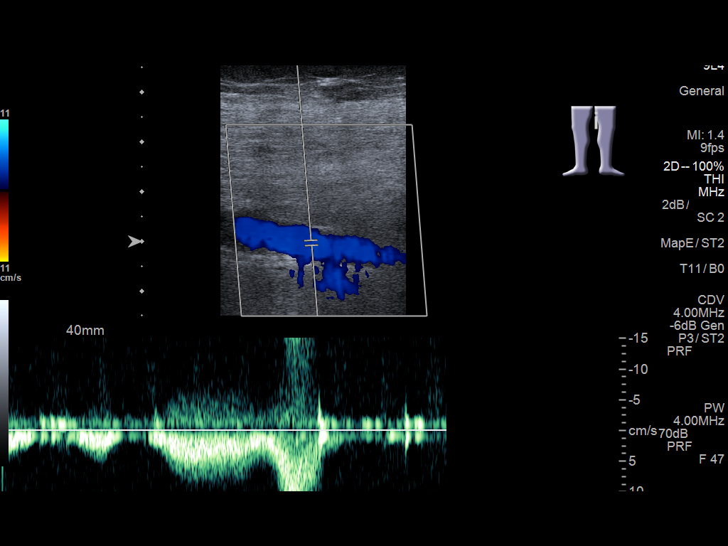
[im 53/59]
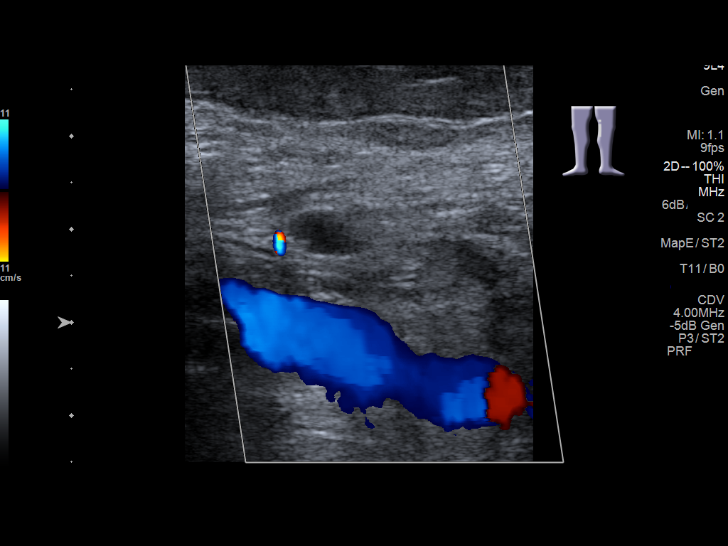
[im 59/59]
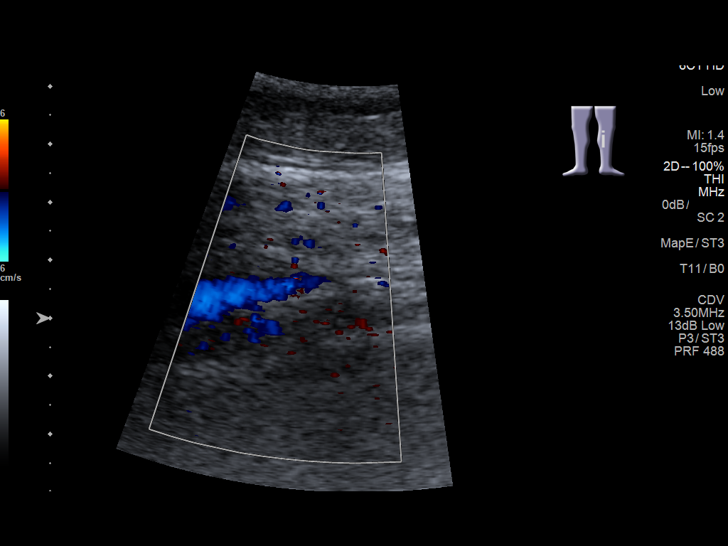

[13 of 24 positions shown; findings below may reference images not displayed]

FINDINGS: RIGHT LOWER EXTREMITY

Common Femoral Vein: No evidence of thrombus. Normal
compressibility, respiratory phasicity and response to augmentation.

Saphenofemoral Junction: No evidence of thrombus. Normal
compressibility and flow on color Doppler imaging.

Profunda Femoral Vein: No evidence of thrombus. Normal
compressibility and flow on color Doppler imaging.

Femoral Vein: No evidence of thrombus. Normal compressibility,
respiratory phasicity and response to augmentation.

Popliteal Vein: No evidence of thrombus. Normal compressibility,
respiratory phasicity and response to augmentation.

Calf Veins: No evidence of thrombus. Normal compressibility and flow
on color Doppler imaging.

Superficial Great Saphenous Vein: No evidence of thrombus. Normal
compressibility.

Venous Reflux:  None.

Other Findings:  None.

LEFT LOWER EXTREMITY

Common Femoral Vein: No evidence of thrombus. Normal
compressibility, respiratory phasicity and response to augmentation.

Saphenofemoral Junction: No evidence of thrombus. Normal
compressibility and flow on color Doppler imaging.

Profunda Femoral Vein: No evidence of thrombus. Normal
compressibility and flow on color Doppler imaging.

Femoral Vein: No evidence of thrombus. Normal compressibility,
respiratory phasicity and response to augmentation.

Popliteal Vein: No evidence of thrombus. Normal compressibility,
respiratory phasicity and response to augmentation.

Calf Veins: No evidence of thrombus. Normal compressibility and flow
on color Doppler imaging.

Superficial Great Saphenous Vein: No evidence of thrombus. Normal
compressibility.

Venous Reflux:  None.

Other Findings:  None.
IMPRESSION: No evidence of RIGHT or LEFT deep venous thrombosis.

## 2018-11-12 MED ORDER — MORPHINE SULFATE (CONCENTRATE) 10 MG/0.5ML PO SOLN: 5.0000 mg | ORAL | 0 refills | Status: AC | PRN

## 2018-11-12 MED ORDER — LORAZEPAM 0.5 MG PO TABS: 0.5000 mg | ORAL_TABLET | Freq: Four times a day (QID) | ORAL | 0 refills | Status: AC | PRN

## 2018-11-12 NOTE — Discharge Summary (Signed)
[facility] at [place] NAME: Carol Bryan    MR#:  [id number]  DATE OF BIRTH:  14-Dec-1922  DATE OF ADMISSION:  11/06/2018 ADMITTING PHYSICIAN: Dustin Flock, MD  DATE OF DISCHARGE: 11/12/2018  PRIMARY CARE PHYSICIAN: [name], MD    ADMISSION DIAGNOSIS:  Dehydration [E86.0] Tachycardia [R00.0] Acute cystitis without hematuria [N30.00] Acute respiratory failure with hypoxia (HCC) [J96.01] Contusion of face, initial encounter [S00.83XA] Fall, initial encounter [W19.XXXA]  DISCHARGE DIAGNOSIS:  Active Problems:   Acute respiratory failure ([place])   Palliative care encounter   Dehydration   SECONDARY DIAGNOSIS:   Past Medical History:  Diagnosis Date  . Atrial flutter ([place])    cardioverted  . Cancer [facility])    recent dx'd with Lung and Liver cancer per daughter.  . Collagen vascular disease ([place])    RA  . DVT (deep venous thrombosis) ([place])   . HLD (hyperlipidemia)   . Hypertension   . Hypothyroid   . Pulmonary embolism ([place])   . RA (rheumatoid arthritis) ([place])   . Sjogren's disease [facility])     HOSPITAL COURSE:   84 year old female with past medical history of arthritis, Sjogren's disease, history of pulmonary embolism, hypertension, hyperlipidemia, atrial flutter, recent admission for acute diverticulitis who was admitted to the hospital due to generalized weakness, shortness of breath.  1.  Acute respiratory failure with hypoxia- secondary to suspected metastatic disease from unknown carcinoma. -Continue O2 supplementation.  2.  Metastatic carcinoma with unknown primary-patient had a PET scan done as an outpatient which showed pulmonary nodules and multiple hepatic metastases.  Plan was for possible liver biopsy but patient's condition declined and she therefore came to the hospital. -Patient's family does not want to pursue further care and have opted for hospice services.  3.  Failure to thrive/poor appetite-  secondary to underlying malignancy and other comorbidities. - cont. To encourage PO intake.   4.  History of previous DVT/PE-continue Eliquis.  5.  History of rheumatoid arthritis-continue Plaquenil, maintenance prednisone.  6.  Hypothyroidism-continue Synthroid.  7.  History of atrial flutter-rate controlled.  Continue Toprol continue Eliquis.  Plan was for possible discharge to short-term rehab with palliative/hospice to follow but patient's condition declined and family has now opted for Hospice Services. Pt. Is now being discharged Home with Hospice Services.  Family is in agreement.   DISCHARGE CONDITIONS:   Stable.   CONSULTS OBTAINED:    DRUG ALLERGIES:   Allergies  Allergen Reactions  . Ciprofloxacin Other (See Comments)    Foot burning; tolerates levaquin  . Methotrexate Other (See Comments)    leukopenia  . Gold-Containing Drug Products Rash  . Other Other (See Comments) and Rash  . Sulfa Antibiotics Rash    DISCHARGE MEDICATIONS:   Allergies as of 11/12/2018      Reactions   Ciprofloxacin Other (See Comments)   Foot burning; tolerates levaquin   Methotrexate Other (See Comments)   leukopenia   Gold-containing Drug Products Rash   Other Other (See Comments), Rash   Sulfa Antibiotics Rash      Medication List    TAKE these medications   acetaminophen 500 MG tablet Commonly known as:  TYLENOL Take 500 mg by mouth daily as needed for mild pain or moderate pain.   amitriptyline 10 MG tablet Commonly known as:  ELAVIL Take 20 mg by mouth at bedtime.   apixaban 2.5 MG Tabs tablet Commonly known as:  ELIQUIS Take 2.5 mg by mouth 2 (  two) times daily.   azelastine 0.1 % nasal spray Commonly known as:  ASTELIN Place 1 spray into both nostrils 2 (two) times daily. Use in each nostril as directed   cholecalciferol 25 MCG (1000 UT) tablet Commonly known as:  VITAMIN D3 Take 1,000 Units by mouth daily.   diclofenac sodium 1 % Gel Commonly known  as:  VOLTAREN Apply 2 g topically 3 (three) times daily.   folic acid 1 MG tablet Commonly known as:  FOLVITE Take 3 mg by mouth daily.   furosemide 20 MG tablet Commonly known as:  LASIX Take 10 mg by mouth 2 (two) times daily.   HYDROcodone-acetaminophen 5-325 MG tablet Commonly known as:  NORCO/VICODIN Take 1 tablet by mouth every 4 (four) hours as needed for moderate pain.   hydroxychloroquine 200 MG tablet Commonly known as:  PLAQUENIL Take 200 mg by mouth daily.   levothyroxine 50 MCG tablet Commonly known as:  SYNTHROID, LEVOTHROID Take 50 mcg by mouth daily before breakfast.   lidocaine 5 % Commonly known as:  LIDODERM Place 1 patch onto the skin daily. (remove after 12 hours or as directed   LORazepam 0.5 MG tablet Commonly known as:  ATIVAN Take 1 tablet (0.5 mg total) by mouth every 6 (six) hours as needed for anxiety.   metoprolol succinate 12.5 mg Tb24 24 hr tablet Commonly known as:  TOPROL-XL Take 12.5 mg by mouth daily.   morphine CONCENTRATE 10 MG/0.5ML Soln concentrated solution Take 0.25 mLs (5 mg total) by mouth every 2 (two) hours as needed for moderate pain, severe pain, anxiety or shortness of breath.   pantoprazole 40 MG tablet Commonly known as:  PROTONIX Take 1 tablet (40 mg total) by mouth daily.   polyvinyl alcohol 1.4 % ophthalmic solution Commonly known as:  LIQUIFILM TEARS Place 1 drop into both eyes as needed for dry eyes.   potassium chloride SA 20 MEQ tablet Commonly known as:  K-DUR,KLOR-CON Take 20 mEq by mouth daily.   predniSONE 5 MG tablet Commonly known as:  DELTASONE Take 5 mg by mouth 2 (two) times daily with a meal.   prochlorperazine 10 MG tablet Commonly known as:  COMPAZINE Take 1 tablet (10 mg total) by mouth every 6 (six) hours as needed for nausea or vomiting.   tiZANidine 2 MG tablet Commonly known as:  ZANAFLEX Take 2 mg by mouth 2 (two) times daily as needed for muscle spasms.         DISCHARGE  INSTRUCTIONS:   DIET:  Regular diet  DISCHARGE CONDITION:  Stable  ACTIVITY:  Activity as tolerated  OXYGEN:  Home Oxygen: Yes.     Oxygen Delivery: 2 liters/min via Patient connected to nasal cannula oxygen  DISCHARGE LOCATION:  Home with Hospice.    If you experience worsening of your admission symptoms, develop shortness of breath, life threatening emergency, suicidal or homicidal thoughts you must seek medical attention immediately by calling 911 or calling your MD immediately  if symptoms less severe.  You Must read complete instructions/literature along with all the possible adverse reactions/side effects for all the Medicines you take and that have been prescribed to you. Take any new Medicines after you have completely understood and accpet all the possible adverse reactions/side effects.   Please note  You were cared for by a hospitalist during your hospital stay. If you have any questions about your discharge medications or the care you received while you were in the hospital after you are discharged, you  can call the unit and asked to speak with the hospitalist on call if the hospitalist that took care of you is not available. Once you are discharged, your primary care physician will handle any further medical issues. Please note that NO REFILLS for any discharge medications will be authorized once you are discharged, as it is imperative that you return to your primary care physician (or establish a relationship with a primary care physician if you do not have one) for your aftercare needs so that they can reassess your need for medications and monitor your lab values.     Today   Still remains confused this a.m. but follows some simple commands. Pt's daughter is at bedside.  Will d/c home with Hospice Services today.   VITAL SIGNS:  Blood pressure 103/66, pulse 80, temperature 99.8 F (37.7 C), resp. rate 18, height 5\' 5"  (1.651 m), weight 69.9 kg, SpO2 90 %.  I/O:     Intake/Output Summary (Last 24 hours) at 11/12/2018 1333 Last data filed at 11/12/2018 0550 Gross per 24 hour  Intake -  Output 1100 ml  Net -1100 ml    PHYSICAL EXAMINATION:  GENERAL:  84 y.o.-year-old patient lying in the bed with no acute distress.  EYES: Pupils equal, round, reactive to light and accommodation. No scleral icterus. Extraocular muscles intact.  HEENT: Head atraumatic, normocephalic. Oropharynx and nasopharynx clear.  NECK:  Supple, no jugular venous distention. No thyroid enlargement, no tenderness.  LUNGS: Poor Resp. effort, no wheezing, rales,rhonchi. No use of accessory muscles of respiration.  CARDIOVASCULAR: S1, S2 normal. No murmurs, rubs, or gallops.  ABDOMEN: Soft, non-tender, non-distended. Bowel sounds present. No organomegaly or mass.  EXTREMITIES: No pedal edema, cyanosis, or clubbing.  NEUROLOGIC: Cranial nerves II through XII are intact. No focal motor or sensory defecits b/l. Globally weak.  PSYCHIATRIC: The patient is alert and oriented x 1. SKIN: No obvious rash, lesion, or ulcer.   DATA REVIEW:   CBC Recent Labs  Lab 11/10/18 0600  WBC 3.1*  HGB 9.4*  HCT 32.1*  PLT 179    Chemistries  Recent Labs  Lab 11/06/18 2130 11/08/18 1341 11/11/18 1450  NA  --  135  --   K  --  4.3  --   CL  --  107  --   CO2  --  17*  --   GLUCOSE  --  73  --   BUN  --  23  --   CREATININE  --  0.94 1.01*  CALCIUM  --  7.1*  --   AST 25  --   --   ALT 14  --   --   ALKPHOS 149*  --   --   BILITOT 0.7  --   --     Cardiac Enzymes Recent Labs  Lab 11/06/18 2124  TROPONINI 0.06*    Microbiology Results  Results for orders placed or performed during the hospital encounter of 11/06/18  Urine culture     Status: Abnormal   Collection Time: 11/06/18  6:38 PM  Result Value Ref Range Status   Specimen Description   Final    URINE, RANDOM Performed at [facility], [address], [place], [place] [address]    Special Requests    Final    NONE Performed at [facility], [address], [place], [place] [address]    Culture >=100,000 COLONIES/mL ENTEROBACTER AEROGENES (A)  Final   Report Status 11/09/2018 FINAL  Final  Organism ID, Bacteria ENTEROBACTER AEROGENES (A)  Final      Susceptibility   Enterobacter aerogenes - MIC*    CEFAZOLIN >=64 RESISTANT Resistant     CEFTRIAXONE >=64 RESISTANT Resistant     CIPROFLOXACIN <=0.25 SENSITIVE Sensitive     GENTAMICIN <=1 SENSITIVE Sensitive     IMIPENEM 1 SENSITIVE Sensitive     NITROFURANTOIN 64 INTERMEDIATE Intermediate     TRIMETH/SULFA <=20 SENSITIVE Sensitive     PIP/TAZO >=128 RESISTANT Resistant     * >=100,000 COLONIES/mL ENTEROBACTER AEROGENES    RADIOLOGY:  No results found.    Management plans discussed with the patient, family and they are in agreement.  CODE STATUS:     Code Status Orders  (From admission, onward)         Start     Ordered   11/07/18 0945  Do not attempt resuscitation (DNR)  Continuous    Question Answer Comment  In the event of cardiac or respiratory ARREST Do not call a "code blue"   In the event of cardiac or respiratory ARREST Do not perform Intubation, CPR, defibrillation or ACLS   In the event of cardiac or respiratory ARREST Use medication by any route, position, wound care, and other measures to relive pain and suffering. May use oxygen, suction and manual treatment of airway obstruction as needed for comfort.      11/07/18 0946         TOTAL TIME TAKING CARE OF THIS PATIENT: 45 minutes.    Henreitta Leber M.D on 11/12/2018 at 1:33 PM  Between 7am to 6pm - Pager - [phone]  After 6pm go to [email or web address] - Proofreader  [facility]  Office  [phone]  CC: Primary care physician; Adin Hector, MD

## 2018-11-12 NOTE — Telephone Encounter (Signed)
Orders have been signed and faxed to Hospice.

## 2018-11-12 NOTE — Telephone Encounter (Signed)
Patient being discharged from hospital today with referral to hospice and they want to know if Dr Grayland Ormond will sign orders and be attending doctor. Please advise

## 2018-11-12 NOTE — Progress Notes (Signed)
EMS called for nonemergency transport

## 2018-11-12 NOTE — Progress Notes (Signed)
Follow up visit made to new referral for [facility] services at home. Patient seen lying in bed, daughter's Zigmund Daniel and [name] at bedside. Plan remains for patient to discharge home today via EMS once DME is in place. Discussed premedication with liquid morphine prior to EMS departure. Family and staff RN Robyn in agreement. Patient did take several oral medications this morning, writer spoke with attending physician Dr. Verdell Carmine to clarify discharge medications as family wishes to continue several medications at this time. Signed Prescriptions for lorazepam and liquid morphine given to [name], staff RN Robyn aware. Updated notes/AVS to be faxed to referral when ready. Flo Shanks RN, BSN, [place] [facility] [place], hospital liasion [phone]

## 2018-11-12 NOTE — Progress Notes (Signed)
MD order received to discharge pt home with hospice today; Flo Shanks, Hospice representative previously established home hospice services for the pt (see separate notes); AVS reviewed with pt's daughter; out of facility DNR placed in discharge packet; EMS personnel present for pt discharge, discharge packet given to EMS personnel in order to carry to the pt's home; pt discharged via stretcher on portable O2 with foley catheter in tact to her home

## 2018-11-16 ENCOUNTER — Other Ambulatory Visit: Payer: Medicare Other

## 2018-11-16 ENCOUNTER — Ambulatory Visit: Payer: Medicare Other | Admitting: Oncology

## 2018-11-16 ENCOUNTER — Ambulatory Visit: Payer: Medicare Other

## 2018-11-25 DEATH — deceased

## 2019-01-14 ENCOUNTER — Ambulatory Visit: Payer: Medicare Other | Admitting: Oncology

## 2019-01-14 ENCOUNTER — Other Ambulatory Visit: Payer: Medicare Other

## 2019-03-11 ENCOUNTER — Encounter: Payer: Self-pay | Admitting: Oncology

## 2019-04-13 ENCOUNTER — Encounter: Payer: Self-pay | Admitting: Oncology

## 2020-09-23 IMAGING — CR DG CHEST 2V
2 series · 2 of 2 positions shown · non-contrast
Comparison: Chest radiograph October 29, 2017

CLINICAL DATA: Lower chest pain.  Chronic shortness of breath.

EXAM:
CHEST - 2 VIEW

[chest ap]
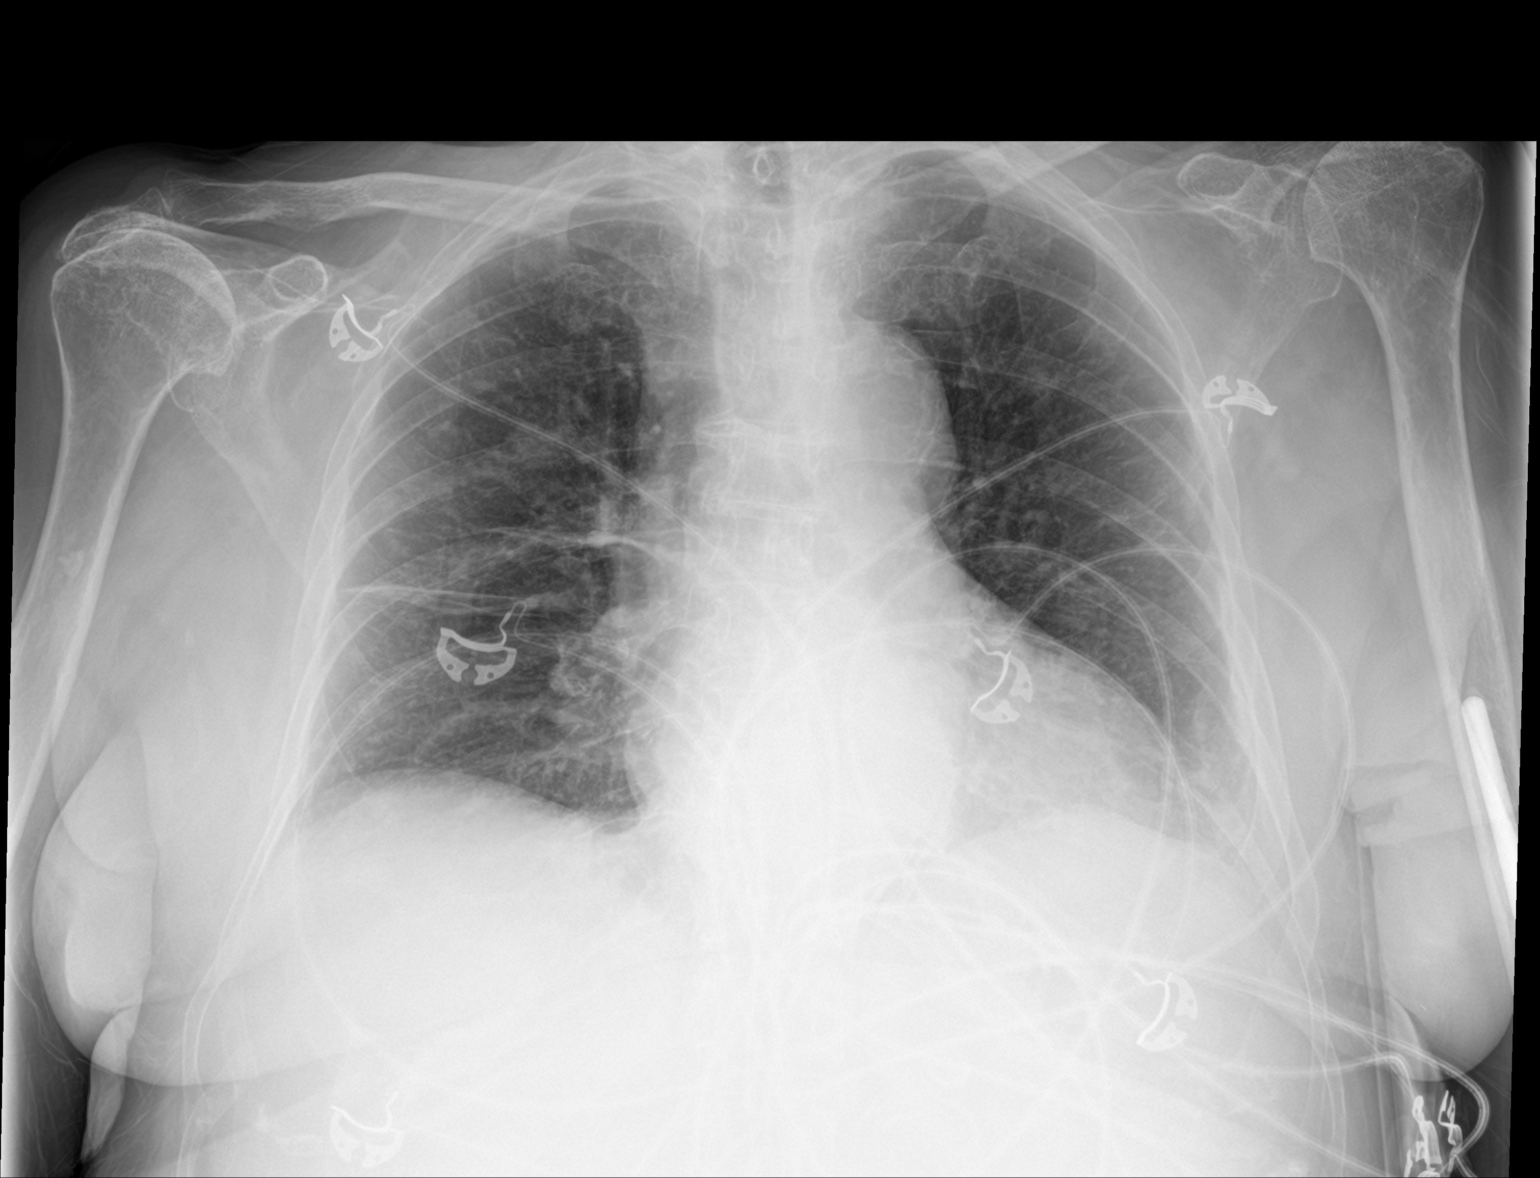

[chest lat]
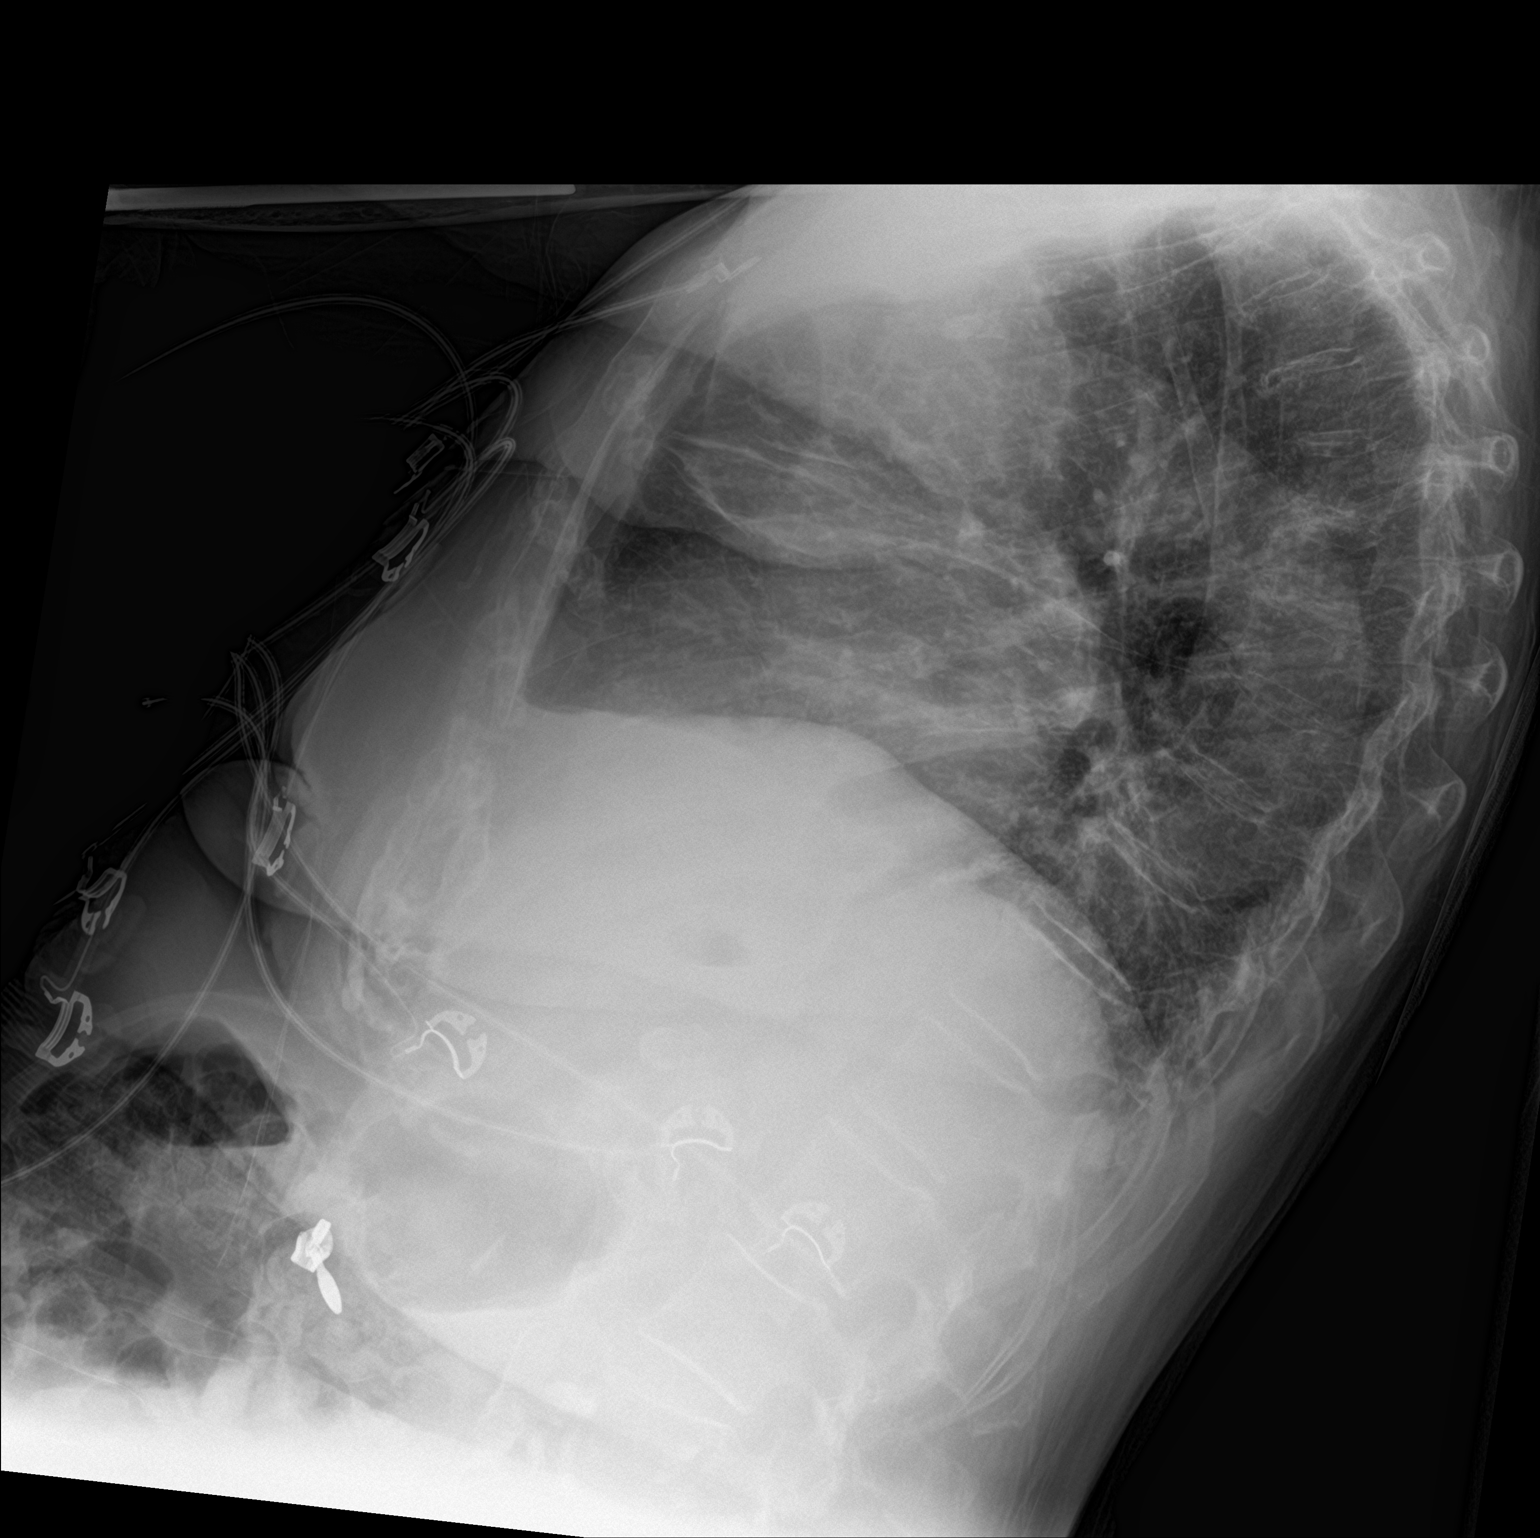

[2 of 2 positions shown; findings below may reference images not displayed]

FINDINGS: Cardiac silhouette is upper limits of normal, mediastinal silhouette
is unremarkable for this low inspiratory examination with crowded
vasculature markings. Calcified aortic arch. The lungs are clear
without pleural effusions or focal consolidations. Mild chronic
interstitial changes. Mid and lower lung zone atelectasis/scarring.
Trachea projects midline and there is no pneumothorax. Included soft
tissue planes and osseous structures are non-suspicious. LEFT
humerus intramedullary rod. Osteopenia. High-riding humeral heads
seen with old rotator cuff injuries.
IMPRESSION: Borderline cardiomegaly. Chronic interstitial changes with bilateral
atelectasis/scarring.

Aortic Atherosclerosis ([id number]).

## 2020-10-31 IMAGING — CT CT ABD-PELV W/ CM
2 of 5 series · 14 of 46 positions shown, 16 images · IV contrast (APPLIED)
Comparison: PET CT 10/29/2018, abdominal CT 09/30/2018

CLINICAL DATA: Constipation. Nausea and vomiting. Acute abdominal
pain. Patient's family reports weakness and lethargy.

EXAM:
CT ABDOMEN AND PELVIS WITH CONTRAST
TECHNIQUE: Multidetector CT imaging of the abdomen and pelvis was performed
using the standard protocol following bolus administration of
intravenous contrast.
CONTRAST:  75mL OMNIPAQUE IOHEXOL 300 MG/ML  SOLN

[Series 2: routine abd/pel with · axial · 0.79mm/px · z∈[-935,-515]mm · 11 of 96 slices shown, 13 images]
[im 6/96  soft-tissue]
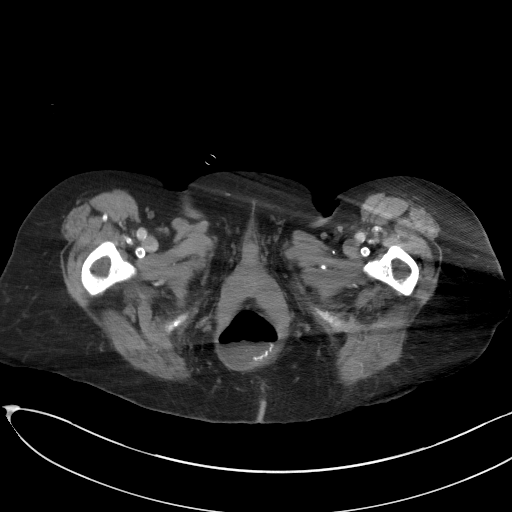
[im 6/96  bone]
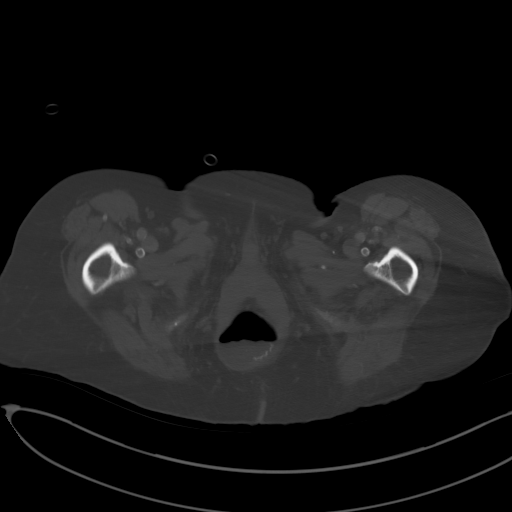
[im 17/96  soft-tissue]
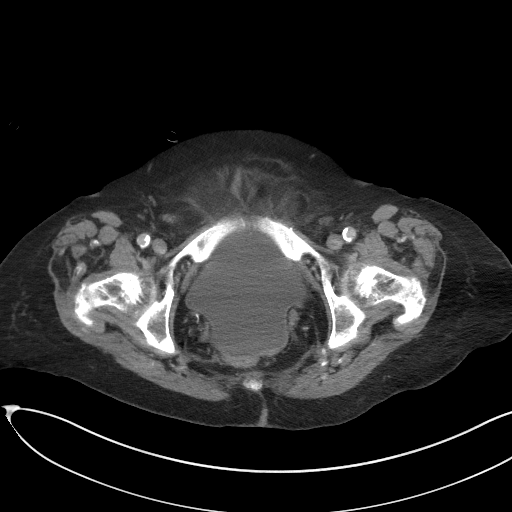
[im 23/96  soft-tissue]
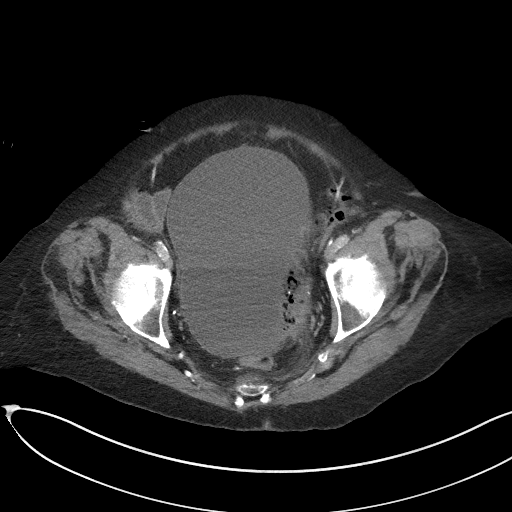
[im 34/96  soft-tissue]
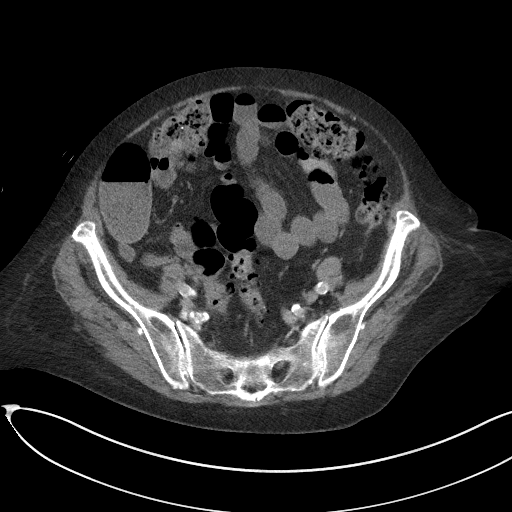
[im 40/96  soft-tissue]
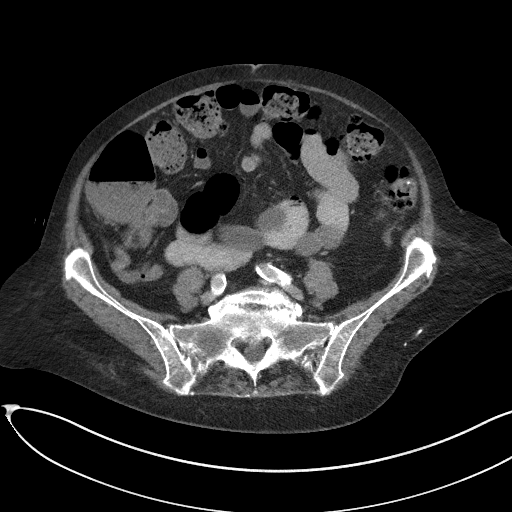
[im 51/96  soft-tissue]
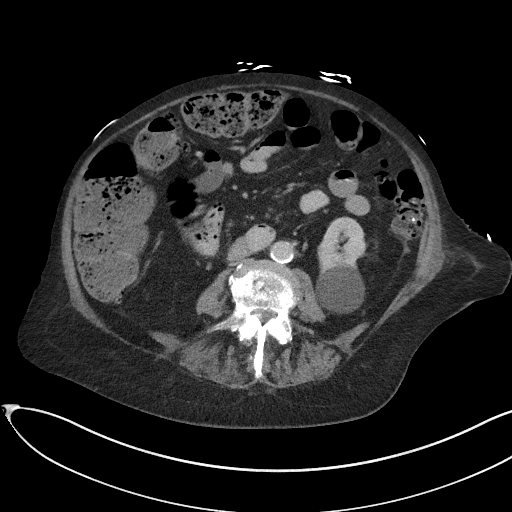
[im 56/96  soft-tissue]
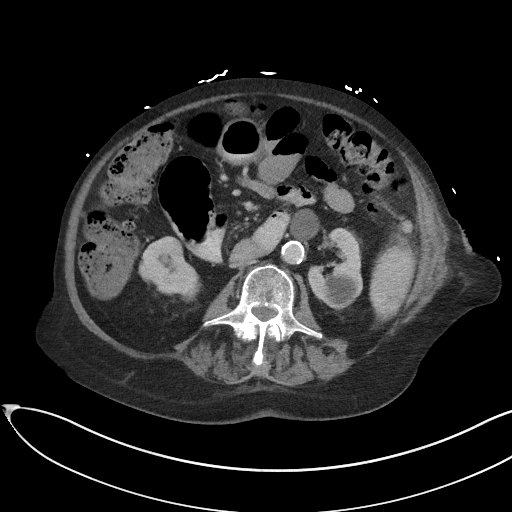
[im 62/96  soft-tissue]
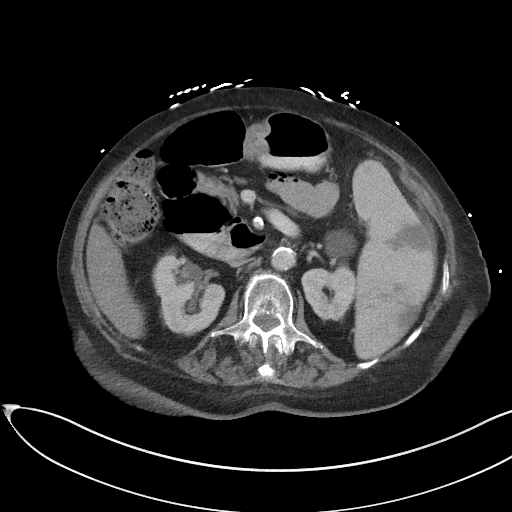
[im 73/96  soft-tissue]
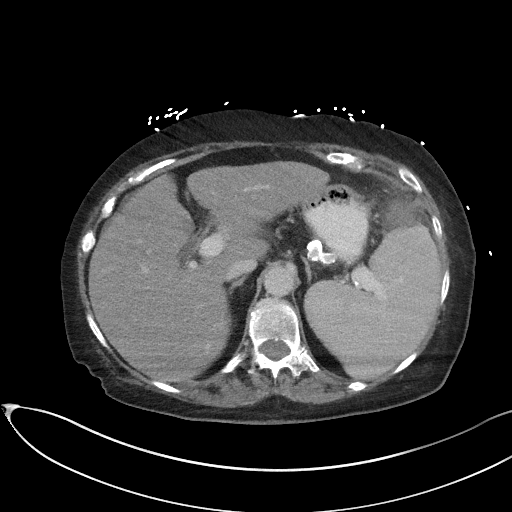
[im 73/96  bone]
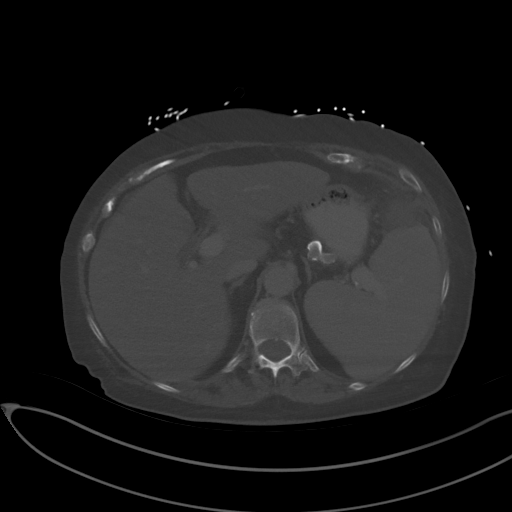
[im 79/96  soft-tissue]
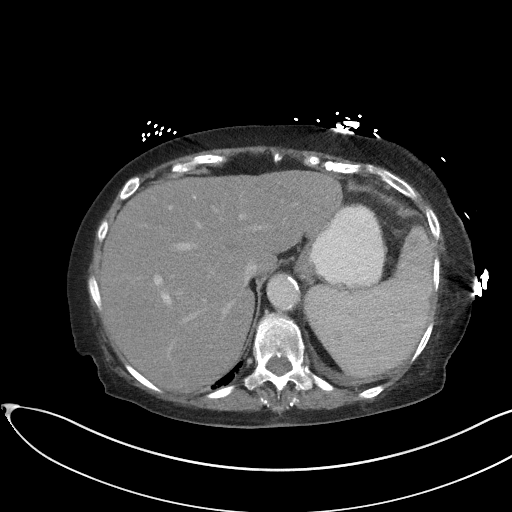
[im 90/96  soft-tissue]
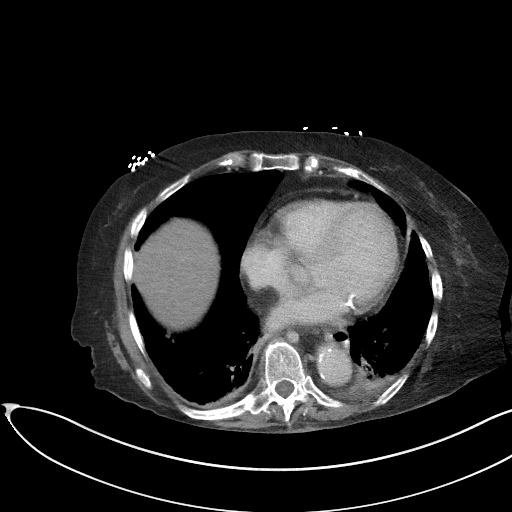

[Series 5: coronal st · coronal · 0.77mm/px · 3 of 98 slices shown]
[im 33/98  soft-tissue]
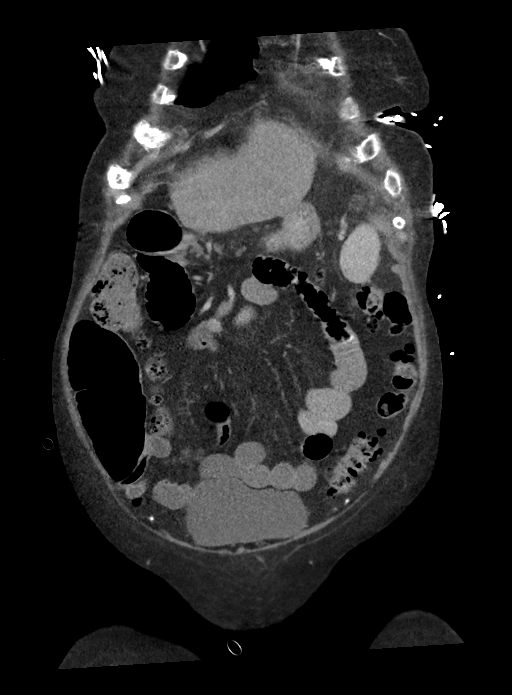
[im 44/98  soft-tissue]
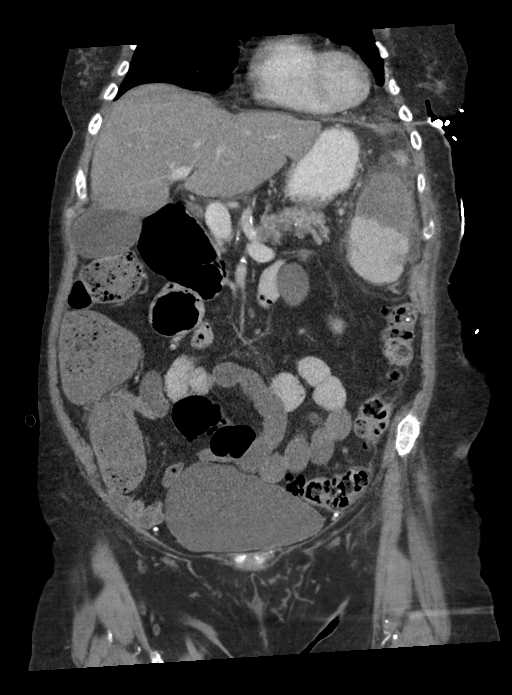
[im 54/98  soft-tissue]
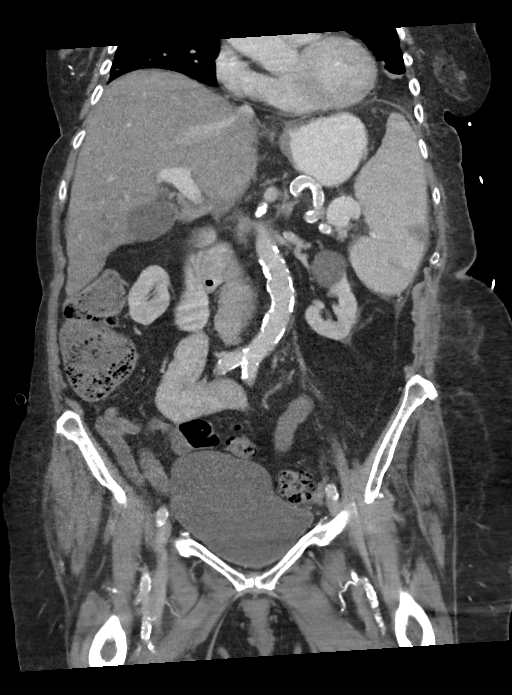

[14 of 46 positions shown; findings below may reference images not displayed]

FINDINGS: Lower chest: Basilar pulmonary nodules which are recently
characterized on PET. Small left pleural effusion which is new.
There is adjacent basilar atelectasis.

Hepatobiliary: Multiple ill-defined hypodense liver lesions,
difficult to define but grossly similar to prior exam. Gallbladder
physiologically distended, no calcified stone. No biliary
dilatation. Small volume of perihepatic ascites.

Pancreas: No ductal dilatation or inflammation.

Spleen: Splenomegaly as seen on prior exams, the spleen spans
cm cranial caudal. Multiple splenic infarcts which have progressed
from prior contrast-enhanced abdominal CT. Infarct in the anterior
upper spleen has adjacent stranding. Small amount of perisplenic
fluid. Multiple additional low-density splenic lesions.

Adrenals/Urinary Tract: No adrenal nodule. No hydronephrosis or
perinephric edema. Left renal cysts appear similar to prior exam.
Urinary bladder is distended. Pelvic floor laxity with cystocele
again seen, less pronounced than on prior exam.

Stomach/Bowel: Again seen large proximal duodenal diverticulum and
small bowel malrotation with duodenum not crossing the midline.
Contrast in fluid-filled small bowel that is not dilated or
obstructed. Appendix not visualized, prior appendectomy per history.
Colonic diverticulosis with resolution of sigmoid diverticulitis
from prior exam. Cecum slightly high-riding in the right mid
abdomen. Moderate to large volume of colonic stool. Pelvic floor
laxity with rectocele, suture line at the and rectal junction.

Vascular/Lymphatic: Aortic atherosclerosis and tortuosity. 13 mm
splenic artery aneurysm is unchanged image 24 series 2. Splenic vein
is tortuous and prominent but no evidence of splenic vein
thrombosis. A 2.3 cm cystic structure anterior to the abdominal
aorta, image 39 series 2 is not hypermetabolic on prior PET and
likely benign lymphatic malformation. This is unchanged from prior
exam. No adenopathy.

Reproductive: Status post hysterectomy. No adnexal masses.

Other: Small amount of perihepatic and perisplenic free fluid. No
free air. No intra-abdominal abscess. No abdominal wall hernia.

Musculoskeletal: T10 compression fracture is new from prior CT but
unchanged from lumbar spine radiograph 11/04/2018. Degenerative
change throughout spine. No focal osseous lesion.
IMPRESSION: 1. Moderate colonic stool burden suggesting constipation. No bowel
obstruction.
2. Splenomegaly with multiple splenic infarcts which have progressed
from prior contrast-enhanced abdominal CT.
3. Hepatic metastatic disease, better characterized on recent PET.
Lung base pulmonary nodules also better assessed on PET.
4. Small amount of perihepatic and perisplenic ascites. Small left
pleural effusion is new from prior exam.
5. Distended urinary bladder, cystocele again seen but less
prominent than on prior.
6. Colonic diverticulosis with resolution of sigmoid diverticulitis
from prior exam.
7. Chronic findings include small splenic artery aneurysm, duodenal
diverticulum with partial bowel malrotation, pelvic floor laxity,
and Aortic atherosclerosis ([id number]).
8. Mild to moderate T10 compression fracture, unchanged from recent
radiographs but new from prior CT.
